# Patient Record
Sex: Female | Born: 1935 | Race: Black or African American | Hispanic: No | Smoking: Former smoker
Health system: Southern US, Community
[De-identification: ages and names within clinical notes are randomized; demographics above are authoritative.]

## PROBLEM LIST (undated history)

## (undated) DIAGNOSIS — I499 Cardiac arrhythmia, unspecified: Secondary | ICD-10-CM

## (undated) DIAGNOSIS — I10 Essential (primary) hypertension: Secondary | ICD-10-CM

## (undated) DIAGNOSIS — I639 Cerebral infarction, unspecified: Secondary | ICD-10-CM

## (undated) DIAGNOSIS — T827XXA Infection and inflammatory reaction due to other cardiac and vascular devices, implants and grafts, initial encounter: Secondary | ICD-10-CM

## (undated) DIAGNOSIS — N289 Disorder of kidney and ureter, unspecified: Secondary | ICD-10-CM

## (undated) DIAGNOSIS — I252 Old myocardial infarction: Secondary | ICD-10-CM

## (undated) DIAGNOSIS — D649 Anemia, unspecified: Secondary | ICD-10-CM

## (undated) DIAGNOSIS — C801 Malignant (primary) neoplasm, unspecified: Secondary | ICD-10-CM

## (undated) HISTORY — DX: Anemia, unspecified: D64.9

## (undated) HISTORY — PX: INSERT / REPLACE / REMOVE PACEMAKER: SUR710

## (undated) HISTORY — PX: APPENDECTOMY: SHX54

## (undated) HISTORY — DX: Cerebral infarction, unspecified: I63.9

## (undated) HISTORY — PX: HIP FRACTURE SURGERY: SHX118

## (undated) HISTORY — PX: OTHER SURGICAL HISTORY: SHX169

## (undated) HISTORY — PX: CHOLECYSTECTOMY: SHX55

## (undated) HISTORY — PX: AV FISTULA PLACEMENT: SHX1204

---

## 2006-05-17 ENCOUNTER — Ambulatory Visit (HOSPITAL_COMMUNITY): Admission: RE | Admit: 2006-05-17 | Discharge: 2006-05-17 | Payer: Self-pay | Admitting: *Deleted

## 2006-05-19 ENCOUNTER — Ambulatory Visit: Payer: Self-pay | Admitting: *Deleted

## 2006-07-19 ENCOUNTER — Ambulatory Visit: Payer: Self-pay | Admitting: Family Medicine

## 2006-08-22 ENCOUNTER — Ambulatory Visit: Payer: Self-pay | Admitting: Nephrology

## 2006-10-03 ENCOUNTER — Other Ambulatory Visit: Payer: Self-pay

## 2006-10-03 ENCOUNTER — Inpatient Hospital Stay: Payer: Self-pay | Admitting: *Deleted

## 2006-10-12 ENCOUNTER — Ambulatory Visit: Payer: Self-pay | Admitting: Internal Medicine

## 2006-11-06 ENCOUNTER — Ambulatory Visit: Payer: Self-pay | Admitting: Urology

## 2006-11-22 ENCOUNTER — Other Ambulatory Visit: Payer: Self-pay

## 2006-11-22 ENCOUNTER — Ambulatory Visit: Payer: Self-pay | Admitting: Urology

## 2008-02-10 ENCOUNTER — Other Ambulatory Visit: Payer: Self-pay

## 2008-02-10 ENCOUNTER — Inpatient Hospital Stay: Payer: Self-pay | Admitting: Internal Medicine

## 2008-02-14 ENCOUNTER — Other Ambulatory Visit: Payer: Self-pay

## 2008-05-14 ENCOUNTER — Inpatient Hospital Stay: Payer: Self-pay | Admitting: Internal Medicine

## 2008-05-30 ENCOUNTER — Inpatient Hospital Stay: Payer: Self-pay | Admitting: Internal Medicine

## 2008-06-05 ENCOUNTER — Ambulatory Visit: Payer: Self-pay | Admitting: Nephrology

## 2008-06-10 ENCOUNTER — Ambulatory Visit: Payer: Self-pay | Admitting: Nephrology

## 2009-01-21 ENCOUNTER — Observation Stay: Payer: Self-pay | Admitting: Nephrology

## 2009-04-01 ENCOUNTER — Observation Stay: Payer: Self-pay | Admitting: Nephrology

## 2009-04-29 ENCOUNTER — Observation Stay: Payer: Self-pay | Admitting: Nephrology

## 2009-05-22 ENCOUNTER — Ambulatory Visit: Payer: Self-pay | Admitting: Vascular Surgery

## 2009-05-27 ENCOUNTER — Observation Stay: Payer: Self-pay | Admitting: Nephrology

## 2009-05-29 ENCOUNTER — Ambulatory Visit: Payer: Self-pay | Admitting: Vascular Surgery

## 2009-06-09 ENCOUNTER — Inpatient Hospital Stay: Payer: Self-pay | Admitting: Internal Medicine

## 2009-06-29 ENCOUNTER — Observation Stay: Payer: Self-pay | Admitting: Nephrology

## 2009-07-06 ENCOUNTER — Emergency Department: Payer: Self-pay

## 2009-07-24 ENCOUNTER — Observation Stay: Payer: Self-pay | Admitting: Nephrology

## 2009-08-26 ENCOUNTER — Observation Stay: Payer: Self-pay | Admitting: Nephrology

## 2009-08-28 ENCOUNTER — Ambulatory Visit: Payer: Self-pay | Admitting: Nephrology

## 2009-09-10 ENCOUNTER — Ambulatory Visit: Payer: Self-pay | Admitting: Internal Medicine

## 2009-09-23 ENCOUNTER — Observation Stay: Payer: Self-pay | Admitting: Nephrology

## 2009-10-21 ENCOUNTER — Observation Stay: Payer: Self-pay | Admitting: Nephrology

## 2009-11-20 ENCOUNTER — Observation Stay: Payer: Self-pay | Admitting: Nephrology

## 2009-12-14 ENCOUNTER — Observation Stay: Payer: Self-pay | Admitting: Nephrology

## 2010-01-11 ENCOUNTER — Observation Stay: Payer: Self-pay | Admitting: Nephrology

## 2010-02-05 ENCOUNTER — Observation Stay: Payer: Self-pay | Admitting: Nephrology

## 2010-02-26 ENCOUNTER — Observation Stay: Payer: Self-pay | Admitting: Nephrology

## 2010-03-26 ENCOUNTER — Observation Stay: Payer: Self-pay | Admitting: Nephrology

## 2010-04-27 ENCOUNTER — Emergency Department: Payer: Self-pay | Admitting: Emergency Medicine

## 2010-04-28 ENCOUNTER — Observation Stay: Payer: Self-pay | Admitting: Nephrology

## 2010-05-12 ENCOUNTER — Observation Stay: Payer: Self-pay | Admitting: Nephrology

## 2010-06-16 ENCOUNTER — Observation Stay: Payer: Self-pay | Admitting: Nephrology

## 2010-07-17 ENCOUNTER — Observation Stay: Payer: Self-pay | Admitting: Nephrology

## 2010-08-02 ENCOUNTER — Observation Stay: Payer: Self-pay | Admitting: Nephrology

## 2010-08-28 ENCOUNTER — Observation Stay: Payer: Self-pay | Admitting: Nephrology

## 2010-09-09 ENCOUNTER — Emergency Department: Payer: Self-pay | Admitting: Unknown Physician Specialty

## 2010-09-27 ENCOUNTER — Observation Stay: Payer: Self-pay | Admitting: Nephrology

## 2010-10-28 ENCOUNTER — Ambulatory Visit: Payer: Self-pay | Admitting: Internal Medicine

## 2010-11-03 ENCOUNTER — Observation Stay: Payer: Self-pay | Admitting: Nephrology

## 2010-12-09 ENCOUNTER — Observation Stay: Payer: Self-pay | Admitting: Internal Medicine

## 2011-01-12 ENCOUNTER — Observation Stay: Payer: Self-pay | Admitting: Nephrology

## 2011-02-11 ENCOUNTER — Observation Stay: Payer: Self-pay | Admitting: Nephrology

## 2011-02-27 ENCOUNTER — Other Ambulatory Visit: Payer: Self-pay | Admitting: Internal Medicine

## 2011-03-14 ENCOUNTER — Observation Stay: Payer: Self-pay | Admitting: Nephrology

## 2011-05-22 ENCOUNTER — Other Ambulatory Visit: Payer: Self-pay | Admitting: Internal Medicine

## 2011-05-29 ENCOUNTER — Other Ambulatory Visit: Payer: Self-pay | Admitting: Internal Medicine

## 2011-05-29 NOTE — Telephone Encounter (Signed)
This pt needs to set up appointment in office prior to additional refills.

## 2011-06-26 ENCOUNTER — Other Ambulatory Visit: Payer: Self-pay | Admitting: Internal Medicine

## 2011-07-13 ENCOUNTER — Other Ambulatory Visit: Payer: Self-pay | Admitting: Internal Medicine

## 2011-07-30 ENCOUNTER — Other Ambulatory Visit: Payer: Self-pay | Admitting: Internal Medicine

## 2011-07-31 NOTE — Telephone Encounter (Signed)
Needs to be seen

## 2011-12-04 ENCOUNTER — Other Ambulatory Visit: Payer: Self-pay | Admitting: Internal Medicine

## 2012-04-07 ENCOUNTER — Inpatient Hospital Stay: Payer: Self-pay | Admitting: Internal Medicine

## 2012-04-07 LAB — COMPREHENSIVE METABOLIC PANEL
Albumin: 2.8 g/dL — ABNORMAL LOW (ref 3.4–5.0)
Alkaline Phosphatase: 110 U/L (ref 50–136)
Anion Gap: 14 (ref 7–16)
Calcium, Total: 8.9 mg/dL (ref 8.5–10.1)
Chloride: 97 mmol/L — ABNORMAL LOW (ref 98–107)
Co2: 22 mmol/L (ref 21–32)
Creatinine: 7.81 mg/dL — ABNORMAL HIGH (ref 0.60–1.30)
EGFR (African American): 5 — ABNORMAL LOW
Glucose: 60 mg/dL — ABNORMAL LOW (ref 65–99)
SGOT(AST): 17 U/L (ref 15–37)
SGPT (ALT): 14 U/L (ref 12–78)

## 2012-04-07 LAB — CBC
HCT: 23.4 % — ABNORMAL LOW (ref 35.0–47.0)
HGB: 7.8 g/dL — ABNORMAL LOW (ref 12.0–16.0)
MCV: 97 fL (ref 80–100)
WBC: 18.9 10*3/uL — ABNORMAL HIGH (ref 3.6–11.0)

## 2012-04-07 LAB — PHOSPHORUS: Phosphorus: 6.3 mg/dL — ABNORMAL HIGH (ref 2.5–4.9)

## 2012-04-07 LAB — LIPASE, BLOOD: Lipase: 122 U/L (ref 73–393)

## 2012-04-07 LAB — CK TOTAL AND CKMB (NOT AT ARMC)
CK, Total: 23 U/L (ref 21–215)
CK-MB: 0.5 ng/mL (ref 0.5–3.6)

## 2012-04-08 LAB — URINALYSIS, COMPLETE
Blood: NEGATIVE
Ketone: NEGATIVE
Leukocyte Esterase: NEGATIVE
Nitrite: NEGATIVE
Ph: 9 (ref 4.5–8.0)
Protein: >=500
RBC,UR: 1 /HPF (ref 0–5)
WBC UR: <1 /HPF (ref 0–5)

## 2012-04-08 LAB — COMPREHENSIVE METABOLIC PANEL
Albumin: 2.8 g/dL — ABNORMAL LOW (ref 3.4–5.0)
Anion Gap: 7 (ref 7–16)
BUN: 26 mg/dL — ABNORMAL HIGH (ref 7–18)
Bilirubin,Total: 0.6 mg/dL (ref 0.2–1.0)
Calcium, Total: 9.2 mg/dL (ref 8.5–10.1)
Chloride: 99 mmol/L (ref 98–107)
Creatinine: 4.52 mg/dL — ABNORMAL HIGH (ref 0.60–1.30)
EGFR (African American): 10 — ABNORMAL LOW
EGFR (Non-African Amer.): 9 — ABNORMAL LOW
Glucose: 100 mg/dL — ABNORMAL HIGH (ref 65–99)
Osmolality: 277 (ref 275–301)
Potassium: 4.1 mmol/L (ref 3.5–5.1)
SGPT (ALT): 13 U/L (ref 12–78)
Total Protein: 7 g/dL (ref 6.4–8.2)

## 2012-04-08 LAB — CBC WITH DIFFERENTIAL/PLATELET
Basophil #: 0 10*3/uL (ref 0.0–0.1)
Basophil %: 0.2 %
Eosinophil #: 0.3 10*3/uL (ref 0.0–0.7)
HCT: 23.7 % — ABNORMAL LOW (ref 35.0–47.0)
HGB: 8 g/dL — ABNORMAL LOW (ref 12.0–16.0)
Lymphocyte %: 5.2 %
MCHC: 33.5 g/dL (ref 32.0–36.0)
MCV: 98 fL (ref 80–100)
Monocyte %: 11.8 %
Neutrophil #: 12 10*3/uL — ABNORMAL HIGH (ref 1.4–6.5)
Neutrophil %: 81.1 %
RDW: 17.9 % — ABNORMAL HIGH (ref 11.5–14.5)
WBC: 14.7 10*3/uL — ABNORMAL HIGH (ref 3.6–11.0)

## 2012-04-09 LAB — BASIC METABOLIC PANEL
Anion Gap: 9 (ref 7–16)
BUN: 40 mg/dL — ABNORMAL HIGH (ref 7–18)
Calcium, Total: 9.4 mg/dL (ref 8.5–10.1)
Chloride: 95 mmol/L — ABNORMAL LOW (ref 98–107)
Co2: 29 mmol/L (ref 21–32)
EGFR (African American): 7 — ABNORMAL LOW
Glucose: 100 mg/dL — ABNORMAL HIGH (ref 65–99)
Osmolality: 276 (ref 275–301)

## 2012-04-09 LAB — CBC WITH DIFFERENTIAL/PLATELET
Basophil #: 0 10*3/uL (ref 0.0–0.1)
Basophil %: 0.5 %
Eosinophil %: 4.1 %
HCT: 25.1 % — ABNORMAL LOW (ref 35.0–47.0)
HGB: 8.3 g/dL — ABNORMAL LOW (ref 12.0–16.0)
Lymphocyte #: 1 10*3/uL (ref 1.0–3.6)
MCH: 32.9 pg (ref 26.0–34.0)
MCV: 99 fL (ref 80–100)
Monocyte %: 14.8 %
Neutrophil #: 6.7 10*3/uL — ABNORMAL HIGH (ref 1.4–6.5)
Neutrophil %: 70.2 %
Platelet: 269 10*3/uL (ref 150–440)
RBC: 2.53 10*6/uL — ABNORMAL LOW (ref 3.80–5.20)
WBC: 9.5 10*3/uL (ref 3.6–11.0)

## 2012-04-10 LAB — CBC WITH DIFFERENTIAL/PLATELET
Basophil %: 0.9 %
Eosinophil #: 0.3 10*3/uL (ref 0.0–0.7)
Eosinophil %: 4.6 %
HCT: 23.2 % — ABNORMAL LOW (ref 35.0–47.0)
HGB: 7.4 g/dL — ABNORMAL LOW (ref 12.0–16.0)
Lymphocyte #: 1.1 10*3/uL (ref 1.0–3.6)
MCH: 31.9 pg (ref 26.0–34.0)
MCHC: 32.1 g/dL (ref 32.0–36.0)
MCV: 99 fL (ref 80–100)
Monocyte #: 1.3 x10 3/mm — ABNORMAL HIGH (ref 0.2–0.9)
Neutrophil #: 4.6 10*3/uL (ref 1.4–6.5)
Neutrophil %: 62.3 %
Platelet: 245 10*3/uL (ref 150–440)
RBC: 2.34 10*6/uL — ABNORMAL LOW (ref 3.80–5.20)

## 2012-04-10 LAB — BASIC METABOLIC PANEL
Anion Gap: 10 (ref 7–16)
BUN: 52 mg/dL — ABNORMAL HIGH (ref 7–18)
Creatinine: 7.46 mg/dL — ABNORMAL HIGH (ref 0.60–1.30)
EGFR (African American): 6 — ABNORMAL LOW
EGFR (Non-African Amer.): 5 — ABNORMAL LOW
Glucose: 95 mg/dL (ref 65–99)
Osmolality: 280 (ref 275–301)

## 2012-04-11 LAB — CBC WITH DIFFERENTIAL/PLATELET
Eosinophil %: 3.8 %
HCT: 24.4 % — ABNORMAL LOW (ref 35.0–47.0)
HGB: 8.1 g/dL — ABNORMAL LOW (ref 12.0–16.0)
Lymphocyte #: 1.1 10*3/uL (ref 1.0–3.6)
Lymphocyte %: 14.6 %
MCHC: 33.4 g/dL (ref 32.0–36.0)
MCV: 98 fL (ref 80–100)
Monocyte %: 22.4 %
Neutrophil #: 4.2 10*3/uL (ref 1.4–6.5)
Neutrophil %: 57.7 %
RBC: 2.49 10*6/uL — ABNORMAL LOW (ref 3.80–5.20)

## 2012-04-13 LAB — CULTURE, BLOOD (SINGLE)

## 2012-05-21 ENCOUNTER — Emergency Department: Payer: Self-pay | Admitting: Emergency Medicine

## 2012-05-21 LAB — COMPREHENSIVE METABOLIC PANEL
Albumin: 3 g/dL — ABNORMAL LOW (ref 3.4–5.0)
Alkaline Phosphatase: 136 U/L (ref 50–136)
BUN: 33 mg/dL — ABNORMAL HIGH (ref 7–18)
Bilirubin,Total: 0.5 mg/dL (ref 0.2–1.0)
Chloride: 101 mmol/L (ref 98–107)
Co2: 22 mmol/L (ref 21–32)
EGFR (Non-African Amer.): 5 — ABNORMAL LOW
Osmolality: 274 (ref 275–301)
Potassium: 3.9 mmol/L (ref 3.5–5.1)
SGOT(AST): 14 U/L — ABNORMAL LOW (ref 15–37)
SGPT (ALT): 9 U/L — ABNORMAL LOW (ref 12–78)

## 2012-05-21 LAB — CBC
HCT: 22.4 % — ABNORMAL LOW (ref 35.0–47.0)
HGB: 6.9 g/dL — ABNORMAL LOW (ref 12.0–16.0)
MCV: 98 fL (ref 80–100)
RBC: 2.3 10*6/uL — ABNORMAL LOW (ref 3.80–5.20)
RDW: 19.8 % — ABNORMAL HIGH (ref 11.5–14.5)
WBC: 5.1 10*3/uL (ref 3.6–11.0)

## 2012-05-21 LAB — URINALYSIS, COMPLETE
Bilirubin,UR: NEGATIVE
Blood: NEGATIVE
Glucose,UR: NEGATIVE mg/dL (ref 0–75)
Ketone: NEGATIVE
Leukocyte Esterase: NEGATIVE
Ph: 8 (ref 4.5–8.0)
Specific Gravity: 1.006 (ref 1.003–1.030)
Squamous Epithelial: 7

## 2012-05-21 LAB — LIPASE, BLOOD: Lipase: 148 U/L (ref 73–393)

## 2012-07-06 LAB — CBC
HGB: 9.5 g/dL — ABNORMAL LOW (ref 12.0–16.0)
Platelet: 218 10*3/uL (ref 150–440)
RBC: 2.84 10*6/uL — ABNORMAL LOW (ref 3.80–5.20)
WBC: 11.2 10*3/uL — ABNORMAL HIGH (ref 3.6–11.0)

## 2012-07-06 LAB — BASIC METABOLIC PANEL
Anion Gap: 11 (ref 7–16)
Chloride: 100 mmol/L (ref 98–107)
Co2: 23 mmol/L (ref 21–32)
Creatinine: 5.11 mg/dL — ABNORMAL HIGH (ref 0.60–1.30)
EGFR (African American): 9 — ABNORMAL LOW
EGFR (Non-African Amer.): 8 — ABNORMAL LOW
Osmolality: 271 (ref 275–301)
Potassium: 3.8 mmol/L (ref 3.5–5.1)
Sodium: 134 mmol/L — ABNORMAL LOW (ref 136–145)

## 2012-07-06 LAB — CK TOTAL AND CKMB (NOT AT ARMC)
CK, Total: 35 U/L (ref 21–215)
CK-MB: 0.6 ng/mL (ref 0.5–3.6)

## 2012-07-07 ENCOUNTER — Observation Stay: Payer: Self-pay | Admitting: Internal Medicine

## 2012-07-07 LAB — TROPONIN I
Troponin-I: 0.1 ng/mL — ABNORMAL HIGH
Troponin-I: 0.1 ng/mL — ABNORMAL HIGH

## 2012-07-07 LAB — PHOSPHORUS: Phosphorus: 3.8 mg/dL (ref 2.5–4.9)

## 2012-07-08 LAB — CBC WITH DIFFERENTIAL/PLATELET
Basophil #: 0.1 10*3/uL (ref 0.0–0.1)
HCT: 27 % — ABNORMAL LOW (ref 35.0–47.0)
HGB: 8.7 g/dL — ABNORMAL LOW (ref 12.0–16.0)
Monocyte #: 1.4 x10 3/mm — ABNORMAL HIGH (ref 0.2–0.9)
Neutrophil #: 4.9 10*3/uL (ref 1.4–6.5)
Neutrophil %: 63.3 %
Platelet: 183 10*3/uL (ref 150–440)
RBC: 2.71 10*6/uL — ABNORMAL LOW (ref 3.80–5.20)
WBC: 7.7 10*3/uL (ref 3.6–11.0)

## 2012-07-08 LAB — BASIC METABOLIC PANEL
Anion Gap: 6 — ABNORMAL LOW (ref 7–16)
Chloride: 98 mmol/L (ref 98–107)
Creatinine: 3.89 mg/dL — ABNORMAL HIGH (ref 0.60–1.30)
EGFR (Non-African Amer.): 10 — ABNORMAL LOW
Glucose: 80 mg/dL (ref 65–99)
Potassium: 3.6 mmol/L (ref 3.5–5.1)
Sodium: 135 mmol/L — ABNORMAL LOW (ref 136–145)

## 2012-07-09 LAB — CBC WITH DIFFERENTIAL/PLATELET
Eosinophil %: 5.1 %
HCT: 26.2 % — ABNORMAL LOW (ref 35.0–47.0)
Lymphocyte #: 1.2 10*3/uL (ref 1.0–3.6)
Lymphocyte %: 18.4 %
MCH: 33.7 pg (ref 26.0–34.0)
MCHC: 34.1 g/dL (ref 32.0–36.0)
Monocyte #: 1.1 x10 3/mm — ABNORMAL HIGH (ref 0.2–0.9)
Monocyte %: 16.9 %
Neutrophil %: 58.3 %
Platelet: 216 10*3/uL (ref 150–440)
RBC: 2.65 10*6/uL — ABNORMAL LOW (ref 3.80–5.20)
RDW: 18.5 % — ABNORMAL HIGH (ref 11.5–14.5)

## 2012-07-09 LAB — BASIC METABOLIC PANEL
Calcium, Total: 9.3 mg/dL (ref 8.5–10.1)
Chloride: 93 mmol/L — ABNORMAL LOW (ref 98–107)
Co2: 27 mmol/L (ref 21–32)
EGFR (African American): 8 — ABNORMAL LOW
Glucose: 110 mg/dL — ABNORMAL HIGH (ref 65–99)
Osmolality: 267 (ref 275–301)
Potassium: 3.8 mmol/L (ref 3.5–5.1)
Sodium: 130 mmol/L — ABNORMAL LOW (ref 136–145)

## 2012-07-17 ENCOUNTER — Other Ambulatory Visit: Payer: Self-pay | Admitting: Internal Medicine

## 2012-07-21 ENCOUNTER — Ambulatory Visit: Payer: Self-pay | Admitting: Internal Medicine

## 2012-07-28 ENCOUNTER — Inpatient Hospital Stay: Payer: Self-pay | Admitting: Internal Medicine

## 2012-07-28 LAB — CBC
HCT: 27 % — ABNORMAL LOW (ref 35.0–47.0)
HGB: 8.7 g/dL — ABNORMAL LOW (ref 12.0–16.0)
MCH: 31.7 pg (ref 26.0–34.0)
MCV: 98 fL (ref 80–100)
Platelet: 341 10*3/uL (ref 150–440)
RDW: 19.1 % — ABNORMAL HIGH (ref 11.5–14.5)

## 2012-07-28 LAB — COMPREHENSIVE METABOLIC PANEL
Albumin: 2.7 g/dL — ABNORMAL LOW (ref 3.4–5.0)
Alkaline Phosphatase: 132 U/L (ref 50–136)
Anion Gap: 12 (ref 7–16)
BUN: 41 mg/dL — ABNORMAL HIGH (ref 7–18)
Bilirubin,Total: 0.6 mg/dL (ref 0.2–1.0)
Chloride: 100 mmol/L (ref 98–107)
Co2: 20 mmol/L — ABNORMAL LOW (ref 21–32)
Creatinine: 5.78 mg/dL — ABNORMAL HIGH (ref 0.60–1.30)
EGFR (African American): 7 — ABNORMAL LOW
EGFR (Non-African Amer.): 6 — ABNORMAL LOW
Glucose: 98 mg/dL (ref 65–99)
Potassium: 4.2 mmol/L (ref 3.5–5.1)
SGOT(AST): 13 U/L — ABNORMAL LOW (ref 15–37)
SGPT (ALT): 18 U/L (ref 12–78)
Sodium: 132 mmol/L — ABNORMAL LOW (ref 136–145)
Total Protein: 7.5 g/dL (ref 6.4–8.2)

## 2012-07-28 LAB — TROPONIN I: Troponin-I: 0.07 ng/mL — ABNORMAL HIGH

## 2012-07-29 LAB — CBC WITH DIFFERENTIAL/PLATELET
Basophil #: 0.1 10*3/uL (ref 0.0–0.1)
HCT: 23.9 % — ABNORMAL LOW (ref 35.0–47.0)
HGB: 7.9 g/dL — ABNORMAL LOW (ref 12.0–16.0)
Lymphocyte %: 7.7 %
MCH: 32 pg (ref 26.0–34.0)
MCV: 97 fL (ref 80–100)
Monocyte #: 1.5 x10 3/mm — ABNORMAL HIGH (ref 0.2–0.9)
Monocyte %: 15.6 %
Platelet: 325 10*3/uL (ref 150–440)
WBC: 9.9 10*3/uL (ref 3.6–11.0)

## 2012-07-29 LAB — COMPREHENSIVE METABOLIC PANEL
Alkaline Phosphatase: 118 U/L (ref 50–136)
Anion Gap: 8 (ref 7–16)
BUN: 20 mg/dL — ABNORMAL HIGH (ref 7–18)
Calcium, Total: 8.8 mg/dL (ref 8.5–10.1)
Chloride: 98 mmol/L (ref 98–107)
Co2: 29 mmol/L (ref 21–32)
Creatinine: 3.79 mg/dL — ABNORMAL HIGH (ref 0.60–1.30)
Glucose: 77 mg/dL (ref 65–99)
Osmolality: 272 (ref 275–301)
Sodium: 135 mmol/L — ABNORMAL LOW (ref 136–145)
Total Protein: 6.6 g/dL (ref 6.4–8.2)

## 2012-07-29 LAB — PHOSPHORUS: Phosphorus: 4.1 mg/dL (ref 2.5–4.9)

## 2012-07-30 LAB — CBC WITH DIFFERENTIAL/PLATELET
Eosinophil #: 0.1 10*3/uL (ref 0.0–0.7)
HCT: 26.8 % — ABNORMAL LOW (ref 35.0–47.0)
HGB: 8.5 g/dL — ABNORMAL LOW (ref 12.0–16.0)
Lymphocyte %: 7.6 %
MCH: 31.2 pg (ref 26.0–34.0)
MCHC: 31.9 g/dL — ABNORMAL LOW (ref 32.0–36.0)
MCV: 98 fL (ref 80–100)
Monocyte #: 1.8 x10 3/mm — ABNORMAL HIGH (ref 0.2–0.9)
Neutrophil #: 8.3 10*3/uL — ABNORMAL HIGH (ref 1.4–6.5)
Platelet: 397 10*3/uL (ref 150–440)
RDW: 18.6 % — ABNORMAL HIGH (ref 11.5–14.5)
WBC: 11.2 10*3/uL — ABNORMAL HIGH (ref 3.6–11.0)

## 2012-07-30 LAB — BASIC METABOLIC PANEL
BUN: 15 mg/dL (ref 7–18)
Co2: 30 mmol/L (ref 21–32)
Creatinine: 3.29 mg/dL — ABNORMAL HIGH (ref 0.60–1.30)
EGFR (African American): 15 — ABNORMAL LOW
Glucose: 98 mg/dL (ref 65–99)
Sodium: 133 mmol/L — ABNORMAL LOW (ref 136–145)

## 2012-07-31 LAB — BASIC METABOLIC PANEL
Anion Gap: 11 (ref 7–16)
BUN: 25 mg/dL — ABNORMAL HIGH (ref 7–18)
Chloride: 94 mmol/L — ABNORMAL LOW (ref 98–107)
Co2: 28 mmol/L (ref 21–32)
Creatinine: 4.49 mg/dL — ABNORMAL HIGH (ref 0.60–1.30)
Osmolality: 271 (ref 275–301)
Potassium: 3.7 mmol/L (ref 3.5–5.1)
Sodium: 133 mmol/L — ABNORMAL LOW (ref 136–145)

## 2012-07-31 LAB — CBC WITH DIFFERENTIAL/PLATELET
Basophil #: 0.2 10*3/uL — ABNORMAL HIGH (ref 0.0–0.1)
Basophil %: 1.3 %
Eosinophil #: 0.2 10*3/uL (ref 0.0–0.7)
HGB: 9 g/dL — ABNORMAL LOW (ref 12.0–16.0)
MCV: 99 fL (ref 80–100)
Monocyte #: 1.7 x10 3/mm — ABNORMAL HIGH (ref 0.2–0.9)
Monocyte %: 13.5 %
RBC: 2.84 10*6/uL — ABNORMAL LOW (ref 3.80–5.20)
RDW: 18.4 % — ABNORMAL HIGH (ref 11.5–14.5)

## 2012-08-03 LAB — BASIC METABOLIC PANEL
Anion Gap: 11 (ref 7–16)
BUN: 28 mg/dL — ABNORMAL HIGH (ref 7–18)
Creatinine: 3.94 mg/dL — ABNORMAL HIGH (ref 0.60–1.30)
Glucose: 104 mg/dL — ABNORMAL HIGH (ref 65–99)
Potassium: 3.5 mmol/L (ref 3.5–5.1)
Sodium: 135 mmol/L — ABNORMAL LOW (ref 136–145)

## 2012-08-03 LAB — CBC WITH DIFFERENTIAL/PLATELET
Basophil #: 0.1 10*3/uL (ref 0.0–0.1)
Basophil %: 0.6 %
Eosinophil %: 1 %
HCT: 30.2 % — ABNORMAL LOW (ref 35.0–47.0)
Lymphocyte %: 9.9 %
MCV: 99 fL (ref 80–100)
Monocyte #: 1 x10 3/mm — ABNORMAL HIGH (ref 0.2–0.9)
Neutrophil #: 8.4 10*3/uL — ABNORMAL HIGH (ref 1.4–6.5)
Neutrophil %: 78.7 %
Platelet: 384 10*3/uL (ref 150–440)
RBC: 3.05 10*6/uL — ABNORMAL LOW (ref 3.80–5.20)
RDW: 18.4 % — ABNORMAL HIGH (ref 11.5–14.5)
WBC: 10.6 10*3/uL (ref 3.6–11.0)

## 2012-08-03 LAB — CULTURE, BLOOD (SINGLE)

## 2012-08-05 ENCOUNTER — Inpatient Hospital Stay: Payer: Self-pay | Admitting: Internal Medicine

## 2012-08-05 LAB — CBC
HCT: 21.7 % — ABNORMAL LOW (ref 35.0–47.0)
HGB: 7 g/dL — ABNORMAL LOW (ref 12.0–16.0)
MCH: 31.5 pg (ref 26.0–34.0)
MCV: 98 fL (ref 80–100)
Platelet: 324 10*3/uL (ref 150–440)
WBC: 8.7 10*3/uL (ref 3.6–11.0)

## 2012-08-05 LAB — TROPONIN I
Troponin-I: 0.35 ng/mL — ABNORMAL HIGH
Troponin-I: 0.41 ng/mL — ABNORMAL HIGH

## 2012-08-05 LAB — COMPREHENSIVE METABOLIC PANEL
Anion Gap: 10 (ref 7–16)
BUN: 57 mg/dL — ABNORMAL HIGH (ref 7–18)
Bilirubin,Total: 0.5 mg/dL (ref 0.2–1.0)
Calcium, Total: 9.1 mg/dL (ref 8.5–10.1)
Chloride: 99 mmol/L (ref 98–107)
EGFR (African American): 7 — ABNORMAL LOW
EGFR (Non-African Amer.): 6 — ABNORMAL LOW
Glucose: 137 mg/dL — ABNORMAL HIGH (ref 65–99)
Potassium: 4.2 mmol/L (ref 3.5–5.1)
Total Protein: 6.5 g/dL (ref 6.4–8.2)

## 2012-08-05 LAB — CK TOTAL AND CKMB (NOT AT ARMC)
CK, Total: 40 U/L (ref 21–215)
CK, Total: 40 U/L (ref 21–215)
CK-MB: 0.6 ng/mL (ref 0.5–3.6)

## 2012-08-06 LAB — BASIC METABOLIC PANEL
BUN: 31 mg/dL — ABNORMAL HIGH (ref 7–18)
Co2: 33 mmol/L — ABNORMAL HIGH (ref 21–32)
EGFR (African American): 11 — ABNORMAL LOW
EGFR (Non-African Amer.): 9 — ABNORMAL LOW
Glucose: 98 mg/dL (ref 65–99)
Potassium: 3.8 mmol/L (ref 3.5–5.1)

## 2012-08-06 LAB — CBC WITH DIFFERENTIAL/PLATELET
Basophil %: 1.2 %
Eosinophil #: 0.2 10*3/uL (ref 0.0–0.7)
Lymphocyte #: 0.8 10*3/uL — ABNORMAL LOW (ref 1.0–3.6)
Lymphocyte %: 9.5 %
MCHC: 32.2 g/dL (ref 32.0–36.0)
MCV: 96 fL (ref 80–100)
Neutrophil #: 6.8 10*3/uL — ABNORMAL HIGH (ref 1.4–6.5)
Platelet: 356 10*3/uL (ref 150–440)
RBC: 3.17 10*6/uL — ABNORMAL LOW (ref 3.80–5.20)
RDW: 18.3 % — ABNORMAL HIGH (ref 11.5–14.5)
WBC: 8.6 10*3/uL (ref 3.6–11.0)

## 2012-08-06 LAB — LIPID PANEL
Cholesterol: 97 mg/dL (ref 0–200)
Ldl Cholesterol, Calc: 56 mg/dL (ref 0–100)
Triglycerides: 74 mg/dL (ref 0–200)
VLDL Cholesterol, Calc: 15 mg/dL (ref 5–40)

## 2012-08-06 LAB — CK TOTAL AND CKMB (NOT AT ARMC)
CK, Total: 36 U/L (ref 21–215)
CK-MB: 0.8 ng/mL (ref 0.5–3.6)

## 2012-08-06 LAB — TROPONIN I: Troponin-I: 0.58 ng/mL — ABNORMAL HIGH

## 2012-08-07 LAB — PHOSPHORUS: Phosphorus: 5.2 mg/dL — ABNORMAL HIGH (ref 2.5–4.9)

## 2012-08-07 LAB — HEMOGLOBIN: HGB: 9.2 g/dL — ABNORMAL LOW (ref 12.0–16.0)

## 2012-08-08 LAB — HEMOGLOBIN: HGB: 9.6 g/dL — ABNORMAL LOW (ref 12.0–16.0)

## 2012-08-09 LAB — HEMOGLOBIN: HGB: 9.6 g/dL — ABNORMAL LOW (ref 12.0–16.0)

## 2012-08-18 ENCOUNTER — Ambulatory Visit: Payer: Self-pay | Admitting: Internal Medicine

## 2012-08-21 ENCOUNTER — Inpatient Hospital Stay: Payer: Self-pay | Admitting: Internal Medicine

## 2012-08-21 LAB — COMPREHENSIVE METABOLIC PANEL
Albumin: 2.1 g/dL — ABNORMAL LOW (ref 3.4–5.0)
Anion Gap: 7 (ref 7–16)
Calcium, Total: 8.8 mg/dL (ref 8.5–10.1)
Chloride: 100 mmol/L (ref 98–107)
Co2: 25 mmol/L (ref 21–32)
EGFR (African American): 8 — ABNORMAL LOW
EGFR (Non-African Amer.): 7 — ABNORMAL LOW
Glucose: 95 mg/dL (ref 65–99)
Potassium: 4.1 mmol/L (ref 3.5–5.1)
SGPT (ALT): 18 U/L (ref 12–78)
Total Protein: 7 g/dL (ref 6.4–8.2)

## 2012-08-21 LAB — APTT: Activated PTT: 43.4 secs — ABNORMAL HIGH (ref 23.6–35.9)

## 2012-08-21 LAB — CBC
HCT: 25.3 % — ABNORMAL LOW (ref 35.0–47.0)
MCHC: 31.4 g/dL — ABNORMAL LOW (ref 32.0–36.0)
MCV: 98 fL (ref 80–100)
RBC: 2.59 10*6/uL — ABNORMAL LOW (ref 3.80–5.20)
RDW: 18.2 % — ABNORMAL HIGH (ref 11.5–14.5)

## 2012-08-21 LAB — PROTIME-INR
INR: 1.3
Prothrombin Time: 15.9 secs — ABNORMAL HIGH (ref 11.5–14.7)

## 2012-08-21 LAB — TROPONIN I: Troponin-I: 0.23 ng/mL — ABNORMAL HIGH

## 2012-08-21 LAB — CK TOTAL AND CKMB (NOT AT ARMC)
CK, Total: 44 U/L (ref 21–215)
CK-MB: 2.5 ng/mL (ref 0.5–3.6)

## 2012-08-21 LAB — PHOSPHORUS: Phosphorus: 3.9 mg/dL (ref 2.5–4.9)

## 2012-08-21 LAB — PRO B NATRIURETIC PEPTIDE: B-Type Natriuretic Peptide: 49834 pg/mL — ABNORMAL HIGH (ref 0–450)

## 2012-08-22 LAB — CBC WITH DIFFERENTIAL/PLATELET
Basophil %: 1.6 %
Eosinophil #: 0.1 10*3/uL (ref 0.0–0.7)
HCT: 23.2 % — ABNORMAL LOW (ref 35.0–47.0)
HGB: 7.3 g/dL — ABNORMAL LOW (ref 12.0–16.0)
Lymphocyte #: 0.6 10*3/uL — ABNORMAL LOW (ref 1.0–3.6)
MCH: 30.5 pg (ref 26.0–34.0)
MCV: 96 fL (ref 80–100)
Monocyte #: 0.7 x10 3/mm (ref 0.2–0.9)
Monocyte %: 11.4 %
Neutrophil #: 4.5 10*3/uL (ref 1.4–6.5)
Neutrophil %: 74.7 %
Platelet: 251 10*3/uL (ref 150–440)
RBC: 2.4 10*6/uL — ABNORMAL LOW (ref 3.80–5.20)

## 2012-08-22 LAB — BODY FLUID CELL COUNT WITH DIFFERENTIAL
Eosinophil: 0 %
Neutrophils: 26 %
Nucleated Cell Count: 736 /mm3
Other Cells BF: 0 %

## 2012-08-22 LAB — BASIC METABOLIC PANEL
Anion Gap: 4 — ABNORMAL LOW (ref 7–16)
BUN: 27 mg/dL — ABNORMAL HIGH (ref 7–18)
Creatinine: 3.55 mg/dL — ABNORMAL HIGH (ref 0.60–1.30)
EGFR (African American): 13 — ABNORMAL LOW
EGFR (Non-African Amer.): 12 — ABNORMAL LOW
Osmolality: 275 (ref 275–301)
Potassium: 3.6 mmol/L (ref 3.5–5.1)
Sodium: 135 mmol/L — ABNORMAL LOW (ref 136–145)

## 2012-08-22 LAB — LACTATE DEHYDROGENASE: LDH: 153 U/L (ref 81–246)

## 2012-08-22 LAB — LACTATE DEHYDROGENASE, PLEURAL OR PERITONEAL FLUID: LDH, Body Fluid: 122 U/L

## 2012-08-22 LAB — PROTIME-INR: INR: 1.3

## 2012-08-22 LAB — CK TOTAL AND CKMB (NOT AT ARMC): CK-MB: 2.7 ng/mL (ref 0.5–3.6)

## 2012-08-22 LAB — GLUCOSE, SEROUS FLUID: Glucose, Body Fluid: 75 mg/dL

## 2012-08-22 LAB — APTT: Activated PTT: 42.8 secs — ABNORMAL HIGH (ref 23.6–35.9)

## 2012-08-23 LAB — CBC WITH DIFFERENTIAL/PLATELET
Basophil %: 1.3 %
Eosinophil %: 2.3 %
HGB: 7.9 g/dL — ABNORMAL LOW (ref 12.0–16.0)
Lymphocyte %: 8.3 %
MCHC: 35.6 g/dL (ref 32.0–36.0)
Monocyte %: 9 %
RBC: 2.3 10*6/uL — ABNORMAL LOW (ref 3.80–5.20)
RDW: 18.1 % — ABNORMAL HIGH (ref 11.5–14.5)
WBC: 8.1 10*3/uL (ref 3.6–11.0)

## 2012-08-23 LAB — BASIC METABOLIC PANEL
BUN: 40 mg/dL — ABNORMAL HIGH (ref 7–18)
Calcium, Total: 8.5 mg/dL (ref 8.5–10.1)
Creatinine: 5.03 mg/dL — ABNORMAL HIGH (ref 0.60–1.30)
EGFR (African American): 9 — ABNORMAL LOW
EGFR (Non-African Amer.): 8 — ABNORMAL LOW
Osmolality: 279 (ref 275–301)
Sodium: 135 mmol/L — ABNORMAL LOW (ref 136–145)

## 2012-08-24 LAB — CBC WITH DIFFERENTIAL/PLATELET
Basophil #: 0.1 10*3/uL (ref 0.0–0.1)
Basophil %: 0.6 %
Eosinophil #: 0.1 10*3/uL (ref 0.0–0.7)
HCT: 22 % — ABNORMAL LOW (ref 35.0–47.0)
Lymphocyte %: 8 %
MCH: 30.2 pg (ref 26.0–34.0)
MCV: 97 fL (ref 80–100)
Monocyte #: 0.7 x10 3/mm (ref 0.2–0.9)
Monocyte %: 8.5 %
Neutrophil #: 7.2 10*3/uL — ABNORMAL HIGH (ref 1.4–6.5)
Neutrophil %: 81.2 %
Platelet: 207 10*3/uL (ref 150–440)
RBC: 2.28 10*6/uL — ABNORMAL LOW (ref 3.80–5.20)
RDW: 18.6 % — ABNORMAL HIGH (ref 11.5–14.5)

## 2012-08-24 LAB — BASIC METABOLIC PANEL
Creatinine: 3.58 mg/dL — ABNORMAL HIGH (ref 0.60–1.30)
EGFR (African American): 13 — ABNORMAL LOW
Potassium: 3.6 mmol/L (ref 3.5–5.1)
Sodium: 136 mmol/L (ref 136–145)

## 2012-08-25 LAB — CBC WITH DIFFERENTIAL/PLATELET
Basophil %: 0.9 %
Eosinophil #: 0.2 10*3/uL (ref 0.0–0.7)
HCT: 21.6 % — ABNORMAL LOW (ref 35.0–47.0)
HGB: 6.9 g/dL — ABNORMAL LOW (ref 12.0–16.0)
Lymphocyte #: 1 10*3/uL (ref 1.0–3.6)
Lymphocyte %: 5.7 %
MCHC: 31.8 g/dL — ABNORMAL LOW (ref 32.0–36.0)
Monocyte #: 1.2 x10 3/mm — ABNORMAL HIGH (ref 0.2–0.9)
Neutrophil #: 14.1 10*3/uL — ABNORMAL HIGH (ref 1.4–6.5)
Neutrophil %: 84.8 %
Platelet: 187 10*3/uL (ref 150–440)
RBC: 2.26 10*6/uL — ABNORMAL LOW (ref 3.80–5.20)
WBC: 16.7 10*3/uL — ABNORMAL HIGH (ref 3.6–11.0)

## 2012-08-26 LAB — CBC WITH DIFFERENTIAL/PLATELET
Basophil #: 0.1 10*3/uL (ref 0.0–0.1)
Eosinophil %: 2.4 %
HGB: 6.8 g/dL — ABNORMAL LOW (ref 12.0–16.0)
Lymphocyte #: 0.7 10*3/uL — ABNORMAL LOW (ref 1.0–3.6)
MCH: 30.9 pg (ref 26.0–34.0)
MCHC: 31.9 g/dL — ABNORMAL LOW (ref 32.0–36.0)
Neutrophil #: 7.5 10*3/uL — ABNORMAL HIGH (ref 1.4–6.5)
Neutrophil %: 79.3 %
Platelet: 166 10*3/uL (ref 150–440)
RBC: 2.22 10*6/uL — ABNORMAL LOW (ref 3.80–5.20)
RDW: 18.1 % — ABNORMAL HIGH (ref 11.5–14.5)

## 2012-08-26 LAB — BASIC METABOLIC PANEL
Anion Gap: 6 — ABNORMAL LOW (ref 7–16)
BUN: 20 mg/dL — ABNORMAL HIGH (ref 7–18)
Chloride: 96 mmol/L — ABNORMAL LOW (ref 98–107)
Co2: 31 mmol/L (ref 21–32)
Glucose: 64 mg/dL — ABNORMAL LOW (ref 65–99)
Osmolality: 267 (ref 275–301)

## 2012-08-27 LAB — CULTURE, BLOOD (SINGLE)

## 2012-08-28 LAB — BASIC METABOLIC PANEL
Anion Gap: 4 — ABNORMAL LOW (ref 7–16)
Chloride: 97 mmol/L — ABNORMAL LOW (ref 98–107)
EGFR (African American): 8 — ABNORMAL LOW
EGFR (Non-African Amer.): 7 — ABNORMAL LOW
Glucose: 85 mg/dL (ref 65–99)
Osmolality: 277 (ref 275–301)
Potassium: 5.2 mmol/L — ABNORMAL HIGH (ref 3.5–5.1)
Sodium: 134 mmol/L — ABNORMAL LOW (ref 136–145)

## 2012-08-28 LAB — CBC WITH DIFFERENTIAL/PLATELET
Basophil #: 0.1 10*3/uL (ref 0.0–0.1)
Eosinophil #: 0.3 10*3/uL (ref 0.0–0.7)
Eosinophil %: 4.4 %
HCT: 21.1 % — ABNORMAL LOW (ref 35.0–47.0)
HGB: 6.8 g/dL — ABNORMAL LOW (ref 12.0–16.0)
Lymphocyte #: 0.8 10*3/uL — ABNORMAL LOW (ref 1.0–3.6)
MCH: 31 pg (ref 26.0–34.0)
Monocyte %: 11.2 %
Neutrophil #: 5.6 10*3/uL (ref 1.4–6.5)
Neutrophil %: 73.6 %
RDW: 18.7 % — ABNORMAL HIGH (ref 11.5–14.5)
WBC: 7.6 10*3/uL (ref 3.6–11.0)

## 2012-08-29 LAB — BASIC METABOLIC PANEL
Anion Gap: 4 — ABNORMAL LOW (ref 7–16)
Calcium, Total: 7.5 mg/dL — ABNORMAL LOW (ref 8.5–10.1)
Chloride: 97 mmol/L — ABNORMAL LOW (ref 98–107)
EGFR (African American): 11 — ABNORMAL LOW
EGFR (Non-African Amer.): 10 — ABNORMAL LOW
Glucose: 67 mg/dL (ref 65–99)
Osmolality: 271 (ref 275–301)
Potassium: 4.7 mmol/L (ref 3.5–5.1)
Sodium: 134 mmol/L — ABNORMAL LOW (ref 136–145)

## 2012-08-29 LAB — CBC WITH DIFFERENTIAL/PLATELET
Basophil %: 1.2 %
Eosinophil #: 0.2 10*3/uL (ref 0.0–0.7)
HCT: 23.9 % — ABNORMAL LOW (ref 35.0–47.0)
Lymphocyte #: 0.9 10*3/uL — ABNORMAL LOW (ref 1.0–3.6)
Lymphocyte %: 12.1 %
MCH: 30.6 pg (ref 26.0–34.0)
MCHC: 32.7 g/dL (ref 32.0–36.0)
Monocyte #: 1.2 x10 3/mm — ABNORMAL HIGH (ref 0.2–0.9)
Neutrophil #: 4.7 10*3/uL (ref 1.4–6.5)
Platelet: 174 10*3/uL (ref 150–440)
RBC: 2.56 10*6/uL — ABNORMAL LOW (ref 3.80–5.20)
RDW: 18.9 % — ABNORMAL HIGH (ref 11.5–14.5)
WBC: 7.2 10*3/uL (ref 3.6–11.0)

## 2012-08-30 LAB — RENAL FUNCTION PANEL
Albumin: 2 g/dL — ABNORMAL LOW (ref 3.4–5.0)
Anion Gap: 5 — ABNORMAL LOW (ref 7–16)
BUN: 36 mg/dL — ABNORMAL HIGH (ref 7–18)
Calcium, Total: 7.6 mg/dL — ABNORMAL LOW (ref 8.5–10.1)
Creatinine: 5.65 mg/dL — ABNORMAL HIGH (ref 0.60–1.30)
Glucose: 48 mg/dL — ABNORMAL LOW (ref 65–99)
Phosphorus: 4.2 mg/dL (ref 2.5–4.9)
Potassium: 5 mmol/L (ref 3.5–5.1)

## 2012-09-01 LAB — RENAL FUNCTION PANEL
BUN: 28 mg/dL — ABNORMAL HIGH (ref 7–18)
Calcium, Total: 7.5 mg/dL — ABNORMAL LOW (ref 8.5–10.1)
Chloride: 97 mmol/L — ABNORMAL LOW (ref 98–107)
Creatinine: 4.93 mg/dL — ABNORMAL HIGH (ref 0.60–1.30)
EGFR (African American): 9 — ABNORMAL LOW
EGFR (Non-African Amer.): 8 — ABNORMAL LOW
Glucose: 114 mg/dL — ABNORMAL HIGH (ref 65–99)
Phosphorus: 2.7 mg/dL (ref 2.5–4.9)
Potassium: 4.2 mmol/L (ref 3.5–5.1)

## 2012-09-02 LAB — TROPONIN I
Troponin-I: 0.2 ng/mL — ABNORMAL HIGH
Troponin-I: 0.21 ng/mL — ABNORMAL HIGH

## 2012-09-12 ENCOUNTER — Inpatient Hospital Stay: Payer: Self-pay | Admitting: Vascular Surgery

## 2012-09-12 LAB — CREATININE, SERUM
Creatinine: 4.26 mg/dL — ABNORMAL HIGH (ref 0.60–1.30)
EGFR (Non-African Amer.): 9 — ABNORMAL LOW

## 2012-09-12 LAB — BUN: BUN: 29 mg/dL — ABNORMAL HIGH (ref 7–18)

## 2012-09-13 LAB — PROTIME-INR
INR: 1.1
Prothrombin Time: 14.5 secs (ref 11.5–14.7)

## 2012-09-13 LAB — APTT: Activated PTT: 36.3 secs — ABNORMAL HIGH (ref 23.6–35.9)

## 2012-09-13 LAB — CBC WITH DIFFERENTIAL/PLATELET
Basophil %: 0.5 %
Eosinophil #: 0 10*3/uL (ref 0.0–0.7)
Eosinophil %: 0.1 %
Lymphocyte #: 0.7 10*3/uL — ABNORMAL LOW (ref 1.0–3.6)
Lymphocyte %: 14.5 %
MCH: 31.2 pg (ref 26.0–34.0)
Monocyte #: 0.5 x10 3/mm (ref 0.2–0.9)
Monocyte %: 9.4 %
Neutrophil #: 3.7 10*3/uL (ref 1.4–6.5)
Neutrophil %: 75.5 %
Platelet: 255 10*3/uL (ref 150–440)
RBC: 2.41 10*6/uL — ABNORMAL LOW (ref 3.80–5.20)
RDW: 22.7 % — ABNORMAL HIGH (ref 11.5–14.5)
WBC: 4.9 10*3/uL (ref 3.6–11.0)

## 2012-09-13 LAB — BASIC METABOLIC PANEL
Creatinine: 4.71 mg/dL — ABNORMAL HIGH (ref 0.60–1.30)
EGFR (African American): 10 — ABNORMAL LOW
EGFR (Non-African Amer.): 8 — ABNORMAL LOW
Potassium: 5.2 mmol/L — ABNORMAL HIGH (ref 3.5–5.1)

## 2012-12-01 ENCOUNTER — Emergency Department: Payer: Self-pay | Admitting: Internal Medicine

## 2012-12-01 LAB — BASIC METABOLIC PANEL
Anion Gap: 6 — ABNORMAL LOW (ref 7–16)
Calcium, Total: 8.5 mg/dL (ref 8.5–10.1)
Co2: 27 mmol/L (ref 21–32)
EGFR (African American): 16 — ABNORMAL LOW
EGFR (Non-African Amer.): 14 — ABNORMAL LOW
Glucose: 114 mg/dL — ABNORMAL HIGH (ref 65–99)
Osmolality: 274 (ref 275–301)
Sodium: 136 mmol/L (ref 136–145)

## 2012-12-01 LAB — CBC
HCT: 34.1 % — ABNORMAL LOW (ref 35.0–47.0)
HGB: 11.3 g/dL — ABNORMAL LOW (ref 12.0–16.0)
Platelet: 173 10*3/uL (ref 150–440)
RDW: 16.7 % — ABNORMAL HIGH (ref 11.5–14.5)
WBC: 6.4 10*3/uL (ref 3.6–11.0)

## 2012-12-01 LAB — PROTIME-INR: INR: 1.1

## 2013-07-29 ENCOUNTER — Emergency Department: Payer: Self-pay | Admitting: Emergency Medicine

## 2013-07-29 LAB — BASIC METABOLIC PANEL
Anion Gap: 10 (ref 7–16)
BUN: 43 mg/dL — AB (ref 7–18)
CALCIUM: 9.3 mg/dL (ref 8.5–10.1)
CREATININE: 8.95 mg/dL — AB (ref 0.60–1.30)
Chloride: 102 mmol/L (ref 98–107)
Co2: 24 mmol/L (ref 21–32)
GFR CALC AF AMER: 4 — AB
GFR CALC NON AF AMER: 4 — AB
GLUCOSE: 93 mg/dL (ref 65–99)
OSMOLALITY: 282 (ref 275–301)
POTASSIUM: 4.3 mmol/L (ref 3.5–5.1)
SODIUM: 136 mmol/L (ref 136–145)

## 2013-07-29 LAB — CBC WITH DIFFERENTIAL/PLATELET
BASOS ABS: 0 10*3/uL (ref 0.0–0.1)
BASOS PCT: 0.3 %
EOS PCT: 4 %
Eosinophil #: 0.3 10*3/uL (ref 0.0–0.7)
HCT: 34.8 % — ABNORMAL LOW (ref 35.0–47.0)
HGB: 11.2 g/dL — ABNORMAL LOW (ref 12.0–16.0)
LYMPHS PCT: 26.1 %
Lymphocyte #: 2 10*3/uL (ref 1.0–3.6)
MCH: 32.7 pg (ref 26.0–34.0)
MCHC: 32.2 g/dL (ref 32.0–36.0)
MCV: 102 fL — ABNORMAL HIGH (ref 80–100)
MONOS PCT: 12.8 %
Monocyte #: 1 x10 3/mm — ABNORMAL HIGH (ref 0.2–0.9)
NEUTROS ABS: 4.4 10*3/uL (ref 1.4–6.5)
Neutrophil %: 56.8 %
Platelet: 256 10*3/uL (ref 150–440)
RBC: 3.42 10*6/uL — ABNORMAL LOW (ref 3.80–5.20)
RDW: 17.6 % — AB (ref 11.5–14.5)
WBC: 7.8 10*3/uL (ref 3.6–11.0)

## 2013-07-29 LAB — URINALYSIS, COMPLETE
Bilirubin,UR: NEGATIVE
Glucose,UR: NEGATIVE mg/dL (ref 0–75)
Hyaline Cast: 4
KETONE: NEGATIVE
Nitrite: NEGATIVE
PH: 8 (ref 4.5–8.0)
RBC,UR: 46 /HPF (ref 0–5)
Specific Gravity: 1.008 (ref 1.003–1.030)

## 2013-07-29 LAB — TROPONIN I: Troponin-I: 0.08 ng/mL — ABNORMAL HIGH

## 2014-09-25 ENCOUNTER — Ambulatory Visit
Admit: 2014-09-25 | Disposition: A | Discharge status: Home / Self Care | Payer: Self-pay | Attending: Physician Assistant | Admitting: Physician Assistant

## 2014-10-02 ENCOUNTER — Ambulatory Visit
Admit: 2014-10-02 | Disposition: A | Discharge status: Home / Self Care | Payer: Self-pay | Attending: Physician Assistant | Admitting: Physician Assistant

## 2014-10-07 NOTE — H&P (Signed)
PATIENT NAME:  Patricia Woodard, Patricia Woodard MR#:  [id number] DATE OF BIRTH:  Apr 15, 1932  DATE OF ADMISSION:  04/07/2012  CHIEF COMPLAINT: Abdominal pain and weight loss.   HISTORY OF PRESENT ILLNESS: Patricia Woodard is a pleasant 81 year old female with end-stage renal disease on dialysis. She has had weight loss over several months she says. This was confirmed in the [facility] records where she gets her outpatient care per my review. She has had worsening abdominal pain for the past several weeks unresponsive to Cipro and Flagyl as an outpatient. She presents with worsening pain now, white count of 18,000, some nausea and poor appetite as well. She has had no diarrhea. Stools have, in fact, been normal.   REVIEW OF SYSTEMS: Negative other than what is noted above.   PAST MEDICAL HISTORY:  1. End-stage renal disease as noted.  2. Hypertension which has been difficult to control.  3. Hypercholesterolemia.  4. Coronary artery disease.  5. Dilated cardiomyopathy with ejection fraction of 15% noted by Dr. Nehemiah Massed.  6. Peripheral arterial disease.  7. Renal cell carcinoma, followed by [facility]. 8. Hypothyroid.   PAST SURGICAL HISTORY:  1. Appendectomy. 2. Cholecystectomy. 3. Ectopic pregnancy. 4. History of pacemaker in [place], [place]. 5. Right kidney biopsy in 2010. Right kidney radiation therapy following that. 6. Left arm AV shunt placement.   MEDICATIONS PER LAST NOTE IN [place] 03/30/2012:   1. Aspirin 81 mg daily.  2. Carvedilol 6.25 b.i.d.  3. Furosemide 80 mg daily.  4. Hydralazine 100 mg t.i.d.  5. lovastatin er 40 mg daily.  6. Pacerone 200 mg daily.  7. Levothyroxine 100 mcg daily.  8. Sevelamer carbonate 800 mg t.i.d. with meals  9. Isosorbide mononitrate 30 mg daily.  10. Losartan 100 mg daily.  11. Amlodipine 5 mg daily.  12. Edarbi 80 mg daily (question she is on that and the losartan is an ARB).  13. Clonidine 0.1 mg t.i.d.  14. Nexium 20 mg daily.   15. Remeron 15 mg at bedtime, was recently begun.   ALLERGIES: Penicillin, shellfish, IV contrast dye are noted. She, again, is on amiodarone without difficulty as noted above.   SOCIAL HISTORY: She is widowed and retired. Smokes minimal cigarettes. No alcohol.   FAMILY HISTORY: Parents deceased, cause of death not known. One living sister, as well multiple family members around also.   PHYSICAL EXAMINATION:   GENERAL: Elderly chronically ill appearing in no acute distress.  VITAL SIGNS: Her blood pressure in triage was elevated at 198/80, temperature 98.8, pulse oximetry 98%, respirations 16, pulse 67. Has not been reassessed as of yet on the system notes.   HEENT: Oropharynx clear. Conjunctivae normal.   NECK: No bruits, thyromegaly, adenopathy, or JVD.   CHEST: Clear to auscultation to inspection.   HEART: Regular rate and rhythm without murmurs or gallops.   ABDOMEN: Soft, mildly protuberant, diffusely tender. Normal bowel sounds.   EXTREMITIES: No clubbing, cyanosis, or edema.    NEUROLOGIC: Nonfocal.   SKIN: No lesions noted.   LABORATORY, DIAGNOSTIC, AND RADIOLOGICAL DATA: Lipase 122. Normal lactic acid. Troponin minimally elevated at 0.15. White count 18,900, hemoglobin 7.8, creatinine 7.81, sodium 133.   CT of the abdomen and pelvis was done without contrast given her allergies and renal disease showed right colon thickened and distal ascending and proximal transverse colon as well. No perforation or abscess was noted. Small pericardial effusion was seen as well.   ASSESSMENT AND PLAN:  1. Abdominal pain and weight loss,  somewhat worrisome overall. She has failed outpatient antibiotics and she has markedly elevated white blood cell count. Will put her in the hospital on IV antibiotics. Will add vancomycin to the Cipro and Flagyl. Follow levels closely in case this is a resistant organism causing her infection. Will need repeat CT scan versus GI consult and colonoscopy  pending her course with this. She is obviously at high risk for any procedure with her multiple medical problems, etc. Will order p.r.n. analgesics for pain as well.  2. End-stage renal disease. Will have Nephrology consult to manage her dialysis and other renal needs in the hospital.  3. Coronary artery disease, fairly stable now. Continue her medications. Will only put her on one ARB and will hold her statin while she is on multiple antibiotics.   ____________________________ Ocie Cornfield. Ouida Sills, MD mwa:drc D: 04/07/2012 11:35:46 ET T: 04/07/2012 12:04:36 ET JOB#: [id number]  cc: Ocie Cornfield. Ouida Sills, MD, <Dictator> Kirk Ruths MD ELECTRONICALLY SIGNED 04/10/2012 7:42

## 2014-10-07 NOTE — Discharge Summary (Signed)
PATIENT NAME:  Patricia Woodard, Patricia Woodard MR#:  [id number] DATE OF BIRTH:  11/22/1931  DATE OF ADMISSION:  04/07/2012 DATE OF DISCHARGE:  04/11/2012  HISTORY OF PRESENT ILLNESS: Patricia Woodard was a 81 year old lady with end-stage renal disease, on dialysis, who presented with worsening abdominal pain unresponsive to outpatient Cipro and Flagyl. She had had some nausea but no vomiting. She had had no diarrhea. White count however was 18,000 and the patient was therefore admitted.   PAST MEDICAL HISTORY:  1. End-stage kidney disease. 2. Hypertension.  3. Hyperlipidemia.  4. Coronary artery disease.  5. Dilated cardiomyopathy.  6. Peripheral arterial disease.  7. Hypothyroidism.  8. History of renal cell carcinoma.   PAST SURGICAL HISTORY:  1. Appendectomy. 2. Cholecystectomy. 3. Ectopic pregnancy.  4. Cardiac pacemaker placement.  5. Right kidney biopsy. 6. Left arm AV shunt placement.   ADMISSION MEDICATIONS: 1. Aspirin 81 mg daily.  2. Carvedilol 6.25 mg twice a day. 3. Furosemide 80 mg daily. 4. Hydralazine 100 mg three times daily. 5. Lovastatin 40 mg daily.  6. Amiodarone 200 mg daily.  7. Levoxyl 100 mcg daily. 8. Sevelamer carbonate 800 mg three times daily with meals. 9. Isosorbide 30 mg daily.  10. Losartan 100 mg daily.  11. Amlodipine 5 mg daily.  12. Clonidine 0.1 mg three times daily. 13. Nexium 20 mg daily.  14. Remeron 15 mg at bedtime.   ALLERGIES: Penicillin, shellfish, and IV contrast dye.   ADMISSION PHYSICAL EXAMINATION: Blood pressure was 198/80, temperature 98.8, pulse oximetry 98%, respirations 16, and pulse was 67. The examination was relatively unremarkable with the exception of the abdomen which was soft and mildly protuberant with diffuse tenderness but no rebound.   LABS/RADIOLOGIC STUDIES: Admission CBC showed a hemoglobin of 7.8 with a hematocrit of 23.4. White count was 18,900. Platelet count was 260,000. Admission comprehensive metabolic panel showed a random  blood sugar of 60, a BUN of 50, a creatinine of 7.8, a sodium of 133, a chloride of 97, an albumin of 2.8, and an estimated GFR of 5. Lipase was 122. Troponin was 0.15. Urinalysis showed greater than 500 protein. Lactic acid was 0.9.   Chest x-ray showed interstitial edema.   CT scan of the abdomen and pelvis was suggestive of right-sided diverticulitis.   HOSPITAL COURSE: The patient was admitted to the regular floor where she was placed on IV antibiotics. She was seen during her hospitalization by nephrology and continued to get dialysis while she was hospitalized. The patient was placed initially on IV Cipro, Flagyl, and vancomycin. The vancomycin was eventually discontinued. Blood cultures were obtained on admission and eventually showed no growth. The patient was continued on IV antibiotics until she was relatively asymptomatic. She was then converted to Cipro and Flagyl orally. Her final CBC showed a hemoglobin of 8.1 with a hematocrit of 24.4. White count was 7300.   DISCHARGE DIAGNOSES:  1. Acute diverticulitis.  2. End-stage renal disease, on dialysis.  3. Volume overload secondary to renal failure.  4. Hypertension.  5. Coronary artery disease.  6. Dilated cardiomyopathy.  7. Hypothyroidism.   DISCHARGE DISPOSITION: The patient was discharged on a renal failure diet with activity as tolerated.   DISCHARGE MEDICATIONS:  1. Amlodipine 5 mg daily.  2. Aspirin 81 mg daily. 3. Diphenhydramine 25 mg every six hours as needed.  4. Carvedilol 6.25 mg twice a day. 5. Cinacalcet 1 tablet daily.  6. Furosemide 80 mg daily.  7. Hydralazine 100 mg three times daily. 8. Isosorbide 30  mg daily.  9. Lovastatin 40 mg daily.  10. Megestrol 40 mg/mL 20 mL daily.  11. Amiodarone 200 mg daily.  12. Renvela 800 mg three times daily. 13. Edarbi 80 mg daily.  14. Rena-Vite 1 tablet daily.  15. Norco 5/325 mg one tablet three times daily as needed.  16. Levoxyl 150 mcg daily.  17. Phenergan 25  mg every six hours as needed.  18. Cipro 250 mg twice a day x7 days.  19. Flagyl 250 mg three times daily x7 days.   PLAN: The patient is being followed up in the office in 1 to 2 weeks.  ____________________________ Hewitt Blade Sarina Ser, MD jbw:[id number] D: 04/25/2012 08:02:37 ET T: 04/26/2012 09:03:26 ET JOB#: [id number]  cc: Hewitt Blade. Sarina Ser, MD, <Dictator> Lottie Mussel III MD ELECTRONICALLY SIGNED 04/27/2012 7:52

## 2014-10-07 NOTE — Consult Note (Signed)
General Aspect 81 year old female with history of ischemic cardiomyopathy, EF of 15%,defibrillator placement after V. fib arrest in the [place] area in October 09, end stage renal disease on dialysis, as well as chronic atrial fibrillation and status post treatment for renal cancer. Patient was admitted to the hospital recently with abdominal pain and weight loss. She is improving after triple antibiotic course, improving appetite, with diagnosis of diverticulitis. While receiving dialysis in the hospital, there was a wide complex noted on the monitor.. There was no firing of her defibrillator,  patient was unaware of arrhythmia.  She does remain on amiodarone 200 mg a day as well as Coreg 6.25 mg daily.  Her device was interrogated several months ago was found to be working normally without any significant arrhythmias.  Review of the strips with Dr. Nehemiah Massed reveals that the wide complex is   probably her paced rhythm.  There is a pacer spike visible.  Also, the rate is in the 80s.  It does not appear to be ventricular tachycardia.   Physical Exam:   GEN well developed, no acute distress, critically ill appearing    HEENT pink conjunctivae    RESP normal resp effort  crackles    CARD Regular rate and rhythm  No murmur    EXTR negative edema    SKIN skin turgor decreased    NEURO cranial nerves intact, motor/sensory function intact    PSYCH alert, A+O to time, place, person, poor insight   Review of Systems:   Subjective/Chief Complaint No complaints currently.    Review of Systems: All other systems were reviewed and found to be negative   Home Medications: Medication Instructions Status  amlodipine 5 mg oral tablet 1 tab(s) orally once a day  Active  aspirin 81 mg oral tablet 1 tab(s) orally once a day Active  diphenhydrAMINE 25 mg oral tablet 1 tab(s) orally every 6 hours as needed for itching.  Active  carvedilol 6.25 mg oral tablet 1 tab(s) orally 2 times a day. Active   cinacalcet 30 mg oral tablet 1 tab(s) orally once a day with dinner. Active  furosemide 80 mg oral tablet 1 tab(s) orally once a day Active  hydrALAZINE 100 mg oral tablet 1 tab(s) orally 3 times a day. Active  isosorbide dinitrate 30 mg oral tablet 1 tab(s) orally once a day Active  lovastatin 40 mg oral tablet 1 tab(s) orally once a day (at bedtime) Active  megestrol 40 mg/mL oral suspension 20 milliliter(s) orally once a day for appetite stimulation. Active  amiodarone 200 mg oral tablet 1 tab(s) orally once a day Active  Renvela 800 mg oral tablet 1 tab(s) orally 3 times a day (with meals). Active  Edarbi 80 mg oral tablet 1 tab(s) orally once a day Active  Rena-Vite oral tablet 1 tab(s) orally once a day only on dialysis days. Active  Norco 5 mg-325 mg oral tablet 1 tab(s) orally 3 times a day, As Needed- for Pain  Active  levothyroxine 150 mcg (0.15 mg) oral tablet 1 tab(s) orally once a day Active  promethazine 25 mg oral tablet 1 tab(s) orally every 6 hours, As Needed- for Nausea, Vomiting.  Active   Lab Results: Hepatic:  20-Oct-13 04:33    Bilirubin, Total 0.6   Alkaline Phosphatase 103   SGPT (ALT) 13   SGOT (AST)  12   Total Protein, Serum 7.0   Albumin, Serum  2.8  Routine Chem:  20-Oct-13 04:33    Glucose,  Serum  100   Creatinine (comp)  4.52   Sodium, Serum 136   Potassium, Serum 4.1   Chloride, Serum 99   CO2, Serum 30   Calcium (Total), Serum 9.2   Anion Gap 7   Osmolality (calc) 277   eGFR (African American)  10   eGFR (Non-African American)  9 (eGFR values <20m/min/1.73 m2 may be an indication of chronic kidney disease (CKD). Calculated eGFR is useful in patients with stable renal function. The eGFR calculation will not be reliable in acutely ill patients when serum creatinine is changing rapidly. It is not useful in  patients on dialysis. The eGFR calculation may not be applicable to patients at the low and high extremes of body sizes, pregnant women,  and vegetarians.)  Routine UA:  20-Oct-13 06:13    Color (UA) Yellow   Clarity (UA) Hazy   Glucose (UA) Negative   Bilirubin (UA) Negative   Ketones (UA) Negative   Specific Gravity (UA) 1.007   Blood (UA) Negative   pH (UA) 9.0   Protein (UA) >=500   Nitrite (UA) Negative   Leukocyte Esterase (UA) Negative (Result(s) reported on 08 Apr 2012 at 06:33AM.)   RBC (UA) 1 /HPF   WBC (UA) <1 /HPF   Bacteria (UA) NONE SEEN   Epithelial Cells (UA) 7 /HPF (Result(s) reported on 08 Apr 2012 at 06:33AM.)  Routine Hem:  20-Oct-13 04:33    WBC (CBC)  14.7   RBC (CBC)  2.42   Hemoglobin (CBC)  8.0   Hematocrit (CBC)  23.7   Platelet Count (CBC) 255   MCV 98   MCH 32.9   MCHC 33.5   RDW  17.9   Neutrophil % 81.1   Lymphocyte % 5.2   Monocyte % 11.8   Eosinophil % 1.7   Basophil % 0.2   Neutrophil #  12.0   Lymphocyte #  0.8   Monocyte #  1.7   Eosinophil # 0.3   Basophil # 0.0 (Result(s) reported on 08 Apr 2012 at 05:19AM.)    Penicillin: Rash  Contrast dye: Itching, Rash  Shellfish: Rash  Vital Signs/Nurse's Notes: **Vital Signs.:   22-Oct-13 06:20   Vital Signs Type Routine   Temperature Temperature (F) 98.4   Celsius 36.8   Temperature Source Oral   Pulse Pulse 60   Respirations Respirations 18   Systolic BP Systolic BP 1973  Diastolic BP (mmHg) Diastolic BP (mmHg) 71   Mean BP 106   Pulse Ox % Pulse Ox % 91   Pulse Ox Activity Level  At rest   Oxygen Delivery Room Air/ 21 %    09:12   Pulse Pulse 75   Pulse source if not from Vital Sign Device per Telemetry Clerk   Telemetry pattern Cardiac Rhythm Normal sinus rhythm; pattern reported by Telemetry Clerk     Impression 81year old female with known ischemic cardiomyopathy, status post defibrillator for V. fib arrest, history of atrial fibrillation, into stage renal disease, history of renal cancer, currently treated for Donald pain, weight loss, improving on triple antibiotic therapy, with a slow wide-complex  noted on monitor during dialysis.  This complex is believed to represent the patient's paced rhythm, not ventricular tachycardia.  There is no further treatment needed by cardiology.  Patient will continue her amiodarone  200 mg a day as well as  coreg 6.25 mg twice a day and regular iInterrogation of her device in the office as already scheduled.   Patient was  seen in collaboration with Dr. Nehemiah Massed.   Electronic Signatures: Roderic Palau (NP)  (Signed 22-Oct-13 14:33)  Authored: General Aspect/Present Illness, History and Physical Exam, Review of System, Home Medications, Labs, Allergies, Vital Signs/Nurse's Notes, Impression/Plan   Last Updated: 22-Oct-13 14:33 by Roderic Palau (NP)

## 2014-10-10 NOTE — Consult Note (Signed)
PATIENT NAME:  Patricia Woodard, Patricia Woodard MR#:  [id number] DATE OF BIRTH:  [birth year]  DATE OF CONSULTATION:  08/06/2012  REFERRING PHYSICIAN:  Phillips Climes, MD CONSULTING PHYSICIAN:  Isaias Cowman, MD  PRIMARY CARE PHYSICIAN: Dr. Gilford Rile  CHIEF COMPLAINT: Left-sided weakness.   REASON FOR CONSULTATION: Requested for evaluation of cardiomyopathy and congestive heart failure.   HISTORY OF PRESENT ILLNESS: The patient is a 81 year old female with multiple comorbidities including dilated cardiomyopathy status post ICD, end-stage renal disease on chronic hemodialysis, who presents with left-sided weakness. The patient was recently discharged from [facility] on 08/03/2012 with respiratory failure, CHF and pneumonia. On 08/05/2012, the patient experienced left-sided weakness and was brought to [facility] Emergency Room. CT scan was unremarkable. The patient was treated with aspirin and admitted to telemetry. She was anemic on admission with a hemoglobin of 7. The patient denies chest pain. She has chronic exertional dyspnea. Admission labs were notable for BUN and creatinine of 57 and 5.81, respectively. Troponin was 0.35. B-type natriuretic peptide was 30,203.   PAST MEDICAL HISTORY: 1. Dilated cardiomyopathy with LVEF of 15% status post ICD. 2. End-stage renal disease, on chronic hemodialysis.  3. Hypertension.  4. Hyperlipidemia.  5. Renal artery disease.  6. Peripheral arterial disease.  7. History of renal cell carcinoma.   MEDICATIONS: 1. Losartan 100 mg at bedtime.  2. Clonidine 0.1 mg t.i.d.  3. Isosorbide mononitrate 30 mg daily.  4. Amiodarone 200 mg daily. 5. Carvedilol 6.25 mg b.i.d.  6. Amlodipine 10 mg at bedtime.  7. Prinivil 1 daily.  8. Lasix 80 mg daily.  9. Hydralazine 100 mg t.i.d.  10. Tylenol 650 mg p.r.n.  11. Mirtazapine 15 mg at bedtime.  12. Promethazine q. 6 p.r.n.  13. Megestrol 400 mg b.i.d.  14. Nexium 20 mg daily.  15. Levothyroxine 1 daily.  16. Rena-Vite 1 daily.    SOCIAL HISTORY: The patient currently lives with her daughter. She denies tobacco abuse.   FAMILY HISTORY: No immediate family history for coronary disease or myocardial infarction.   REVIEW OF SYSTEMS: CONSTITUTIONAL: No fever or chills.  EYES: No blurry vision.  EARS: No hearing loss.  RESPIRATORY: The patient has chronic exertional dyspnea.  CARDIOVASCULAR: The patient denies chest pain.  GASTROINTESTINAL: No nausea, vomiting, diarrhea or constipation.  GENITOURINARY: No dysuria or hematuria.  ENDOCRINE: No polyuria or polydipsia.  HEMATOLOGICAL: No easy bruising or bleeding.  INTEGUMENT: No rash.  MUSCULOSKELETAL: No arthralgias or myalgias.  NEUROLOGICAL: The patient has left-sided weakness, as described above.  PSYCHIATRIC: The patient denies depression or anxiety.   PHYSICAL EXAMINATION: VITAL SIGNS: Blood pressure 131/68, pulse 59, respirations 19, temperature 98.1 and pulse ox 94%.  HEENT: Pupils equal and reactive to light and accommodation.  NECK: Supple without thyromegaly.  LUNGS: Clear.  CARDIOVASCULAR: Normal JVP. Diffuse PMI. Regular rate and rhythm. Normal S1 and S2. No appreciable gallop, murmur or rub.  ABDOMEN: Soft and nontender. Pulses were intact bilaterally.  EXTREMITIES: There was trace pedal edema.  MUSCULOSKELETAL: Normal muscle tone.  NEUROLOGIC: The patient was alert and oriented x 3. The patient had left upper and left lower extremity weakness.   IMPRESSION: This is a 81 year old female with known history of dilated cardiomyopathy status post implantable cardiac defibrillator who presents with new onset of left-sided weakness consistent with acute cerebrovascular accident.  The patient has borderline elevated troponin likely due to demand/supply ischemia and unlikely due to acute coronary syndrome.   RECOMMENDATIONS: 1. Agree with overall current therapy.  2. Would defer full dose  anticoagulation.  3. Defer further cardiac diagnostics at this time.   4. Resume heart failure medications. Will restart carvedilol at low dose, 3.125 mg b.i.d.  ____________________________ Isaias Cowman, MD ap:sb D: 08/06/2012 12:46:16 ET T: 08/06/2012 13:04:27 ET JOB#: [id number]  cc: Isaias Cowman, MD, <Dictator> Isaias Cowman MD ELECTRONICALLY SIGNED 08/14/2012 15:00

## 2014-10-10 NOTE — Discharge Summary (Signed)
PATIENT NAME:  Patricia Woodard, Patricia Woodard MR#:  [id number] DATE OF BIRTH:  1932/06/06  ADDENDUM   The patient was set to go to the nursing home 09/01/2012. However, during dialysis she developed hypotension with decreased responsiveness. She responded rapidly to IV fluid in bolus. She was monitored overnight. Cardiac enzymes were followed, with some minimal troponin elevation to a degree more consistent with chronic renal failure and not indicative of a cardiac event. Family members report that this has happened multiple times with dialysis, she responds better once they slow the rate down previously.   Nephrology plans to do this at her next dialysis. Currently she feels like she is back to her usual baseline and feels ready to go home. Therefore, she will be discharged to the nursing home today if they will accept, following the same discharge instructions that were sent on 09/01/2012.      ____________________________ Adin Hector, MD bjk:[id number] D: 09/02/2012 10:24:00 ET T: 09/02/2012 10:53:41 ET JOB#: [id number]  cc: Tama High III, MD, <Dictator> Ramonita Lab MD ELECTRONICALLY SIGNED 09/06/2012 12:24

## 2014-10-10 NOTE — H&P (Signed)
PATIENT NAME:  Patricia Woodard, Patricia Woodard MR#:  [id number] DATE OF BIRTH:  [birth year]  DATE OF ADMISSION:  08/21/2012  PRIMARY CARE PHYSICIAN:  Dr. Arnetha Courser. Gilford Rile, M.D. CARDIOLOGIST:  Dr. Nehemiah Massed.  VASCULAR:  Dr. Lucky Cowboy   CHIEF COMPLAINT: Shortness of breath.  The patient is a 81 year old African-American female with multiple medical problems including end-stage renal disease on hemodialysis Tuesdays, Thursdays and Saturdays, hypertension. The patient comes in with progressive increasing shortness of breath without any productive cough or phlegm. She had some dry cough. She denies any chest pain. She went to dialysis. Dialysis was not started given the patient's symptoms and brought to the Emergency Room where she was found to be in acute on chronic respiratory hypoxic respiratory failure secondary to acute on chronic systolic congestive heart failure, flare with BNP of 50,000 and a large left pleural effusion acute on chronic. The patient is being admitted for further evaluation and management.   PAST MEDICAL HISTORY: 1.  Anemia, of chronic kidney disease.  2.  History of MI. 3.  History of renal cell carcinoma.  4.  End-stage renal disease on hemodialysis.  5.  Defibrillator/pacemaker placement.  6.  Type 2 diabetes, diet controlled.  7.  Chronic congestive heart failure, systolic.  8.  GERD.  9.  Arthritis. 10.  Hypothyroidism.  11.  History of hypertension.   PAST SURGICAL HISTORY: 1.  Appendectomy.  2.  Cholecystectomy.  3.  Tubal pregnancy. 4.  Patient had ectopic pregnancy surgery. 5.  Left arm AV shunt placement.   MEDICATIONS: 1.  Amlodipine 10 mg daily.  2.  Aspirin 81 mg daily.  3.  Benadryl 25 mg every 6 hours as needed.  4.  Coreg 6.25 b.i.d.  5.  Clonidine 0.1 mg 3 times a day.  6.  Lasix 80 mg daily.  7.  Hydralazine 100 mg 3 times a day.  8.  Isosorbide, dinitrate 30 mg p.o. daily.  9. Losartan 50 mg p.o. daily.  10. Lovastatin 40 mg p.o. daily.  11. Remeron 15 mg at bedtime.   12. Nexium 40 mg daily.  13. Norco 325/5, 1 tablet 3 times a day.  14. Pacerone 200 mg p.o. daily.  15. Promethazine 25 mg half to 1 tablet p.o. q. 6 p.r.n.  16. Renvela 800 mg 1 tablet 3 times a day.  17. Robaxin 500 mg 1 tablet 2 times a day.  18. Sensipar 30 mg once a day.   ALLERGIES:  CONTRAST DYE, PENICILLIN AND SHELLFISH.   SOCIAL HISTORY: The patient currently is a resident at a rehab place getting rehabilitation. Nonsmoker. Nonalcoholic.   FAMILY HISTORY: Both parents deceased, cause of death is unknown.   REVIEW OF SYSTEMS:  CONSTITUTIONAL: Positive for fatigue and weakness.  EYES: No blurred or double vision. No glaucoma or cataracts.  ENT: No tinnitus, ear pain, hearing loss, postnasal drip.  RESPIRATORY: No cough, wheeze, hemoptysis or chronic COPD. CARDIOVASCULAR: No chest pain, orthopnea, edema or arrhythmia.   GASTROINTESTINAL: No nausea, vomiting, diarrhea or abdominal pain. Positive for GERD.  GENITOURINARY: No dysuria or hematuria.  RESPIRATORY: Positive for shortness of breath.  ENDOCRINE: No polyuria, nocturia or thyroid problems.  HEMATOLOGY: :Chronic anemia. No easy bruising.  SKIN: No acne or rash.  MUSCULOSKELETAL: Positive for arthritis and back pain.  NEUROLOGIC: Positive for CVA with weakness.  PSYCHIATRIC: No anxiety or depression. All other systems reviewed and negative.   PHYSICAL EXAMINATION: GENERAL: The patient is awake, alert, oriented x 3, not in acute distress.  VITAL SIGNS:  She is afebrile. Pulse is 59 to 60 with respirations of 20. Blood pressure is 162/67. Sats are 99% on 2 liters.  HEENT: Atraumatic, normocephalic. Pupils PERRLA, EOMI. Oral mucosa is dry.  NECK: Supple.no JVD.  RESPIRATORY: There are bilateral crackles present up to the mid lungs. There is decreased breath sounds in the left side of the lung. No dullness to percussion on the left side of the lung. No use of respiratory muscles or labored breathing.  CARDIOVASCULAR:   Both the heart sounds are normal. No murmur heard. PMI not lateralized. Chest nontender.  EXTREMITIES:  Feeble pedal pulses, good femoral pulses. No lower extremity edema.  ABDOMEN: Soft but nontender. No organomegaly. NEUROLOGIC:  Grossly intact cranial nerves II through XII. No motor or sensory deficits.  PSYCHIATRIC: The patient is awake, alert, oriented x 3.  B-type natriuretic peptide is 49,000. CK is 556.  CK-MB is 2.3. White count is 7.3, H and H is 7.9 and 25.3. Platelet count is 255. Glucose is 95. BUN is 42, creatinine 5.42, sodium 132. LFTs within normal limits. Albumin is 2.1. Troponin is 0.23.  Chest x-ray consistent with large left pleural effusion. Small area of air to left lung apex noted. There is diffuse interstitial and alveolar opacities in the right lung. Trace right pleural effusion. There is a dual lead cardiac pacer.  Element of congestive heart failure noted.  EKG showed electronic pacemaker changes.   ASSESSMENT AND PLAN: A 81 year old patient with history of end-stage renal disease, dilated cardiomyopathy with ejection fraction of 15%, history of coronary artery disease with myocardial infarction and hypertension comes in with:  1.  Acute on chronic hypoxic respiratory failure due to acute on chronic congestive heart failure flare systolic. Ejection fraction was 15% by previous echocardiogram with pacemaker placed with last echo February 15 was read as EF 60% and severe TR.  The possibly there is an error in reading; again, I am not sure about it. The patient has a pacemaker placement. Clinical picture fits into congestive heart failure with elevated BNP of 50,000 with large left pleural effusion and clinical symptoms suggestive of congestive heart failure. The patient is going to be admitted on telemetry floor. Will continue hemodialysis with ultrafiltration. I will continue her home medications which includes her cardiac medications. Dr. Nehemiah Massed will see the patient in  consultation cycle cardiac enzymes x 3.  2.  Acute on chronic increasing left pleural effusion. The patient was seen by Dr. Genevive Bi in February 2014. No procedures were carried out at that time given her acute stroke; hence, discussed with Dr. Genevive Bi who recommends CT drainage and pleural catheter placement along with possibility of pleurodesis at a later date by Dr. Genevive Bi. Dr. Genevive Bi has been consulted.  3. End-stage renal disease on hemodialysis. Dr. Zollie Scale to see patient. Resume HD with ultrafiltration.  4. Acute cerebrovascular accident right MCA territory with right critical internal carotid artery stenosis in February 2014 ER. The patient per neuro and vascular recommendations were made for patient to be started on anticoagulation. Medications, however, for some reason, the patient was discontinued on aspirin 81 mg only, reason I am not sure. Has not been documented anywhere. The patient was at rehab for acute cerebrovascular accident, left-sided weakness improving slowly per doctor.  The patient is supposed to follow up with Dr. Lucky Cowboy as outpatient per family for consideration of stent placement in the right internal carotid artery. Will have the patient follow-up with Dr. Lucky Cowboy after discharge from the hospital once her congestive  heart failure is improved.  5. Hypertension. Continue home medications, which is losartan, amlodipine and isosorbide dinitrate and hydralazine.  6.  Gastroesophageal reflux disease. Continue Nexium. 7.  Acute on chronic anemia, multifactorial. Hemoglobin was 9.8 on 02/17; today is 7.9. No active bleeding noted. We will consider transfusion given congestive heart failure if symptoms do not improve with hemodialysis or thoracentesis.  Above plan for admission was discussed at length with patient's daughter, who is agreeable to it. It was also discussed with patient. THE PATIENT IS A FULL CODE. This is confirmed with daughter.  CRITICAL TIME SPENT:  60 minutes     ____________________________ Gus Height A. Posey Pronto, MD sap:ce D: 08/21/2012 14:28:34 ET T: 08/21/2012 14:53:36 ET JOB#: [id number]  cc: Sona A. Posey Pronto, MD, <Dictator>  Dr. Arnetha Courser. Gilford Rile, M.D.  Dr. Nehemiah Massed  Dr. Benjaman Lobe MD ELECTRONICALLY SIGNED 08/27/2012 14:42

## 2014-10-10 NOTE — Consult Note (Signed)
Brief Consult Note: Diagnosis: bilateral pleural effusion.   Patient was seen by consultant.   Consult note dictated.   Comments: Bilateral pleural effusions in the setting of renal failure and probable heart failure to some degree.  Given her recent stroke, I would not recommend any surgery.  Would consider percutaneous drainage if needed.  Electronic Signatures: Louis Matte (MD)  (Signed 19-Feb-14 17:40)  Authored: Brief Consult Note   Last Updated: 19-Feb-14 17:40 by Louis Matte (MD)

## 2014-10-10 NOTE — Consult Note (Signed)
Referring Physician:  Albertine Patricia   Primary Care Physician:  Albertine Patricia [place], [address], [place], [place] [address], [place] [phone]  Reason for Consult: Admit Date: 05-Aug-2012  Chief Complaint: L sided weakness   History of Present Illness: History of Present Illness:   81 yo RHD F with multiple medical problems presents with sudden onset of L sided weakness that has an unknown onset time.  This weakness persists today.  ROS:  General denies complaints   HEENT no complaints   Lungs no complaints   Cardiac no complaints   GI no complaints   GU no complaints   Musculoskeletal no complaints   Extremities no complaints   Skin no complaints   Neuro L sided weakness   Endocrine no complaints   Psych no complaints   Past Medical/Surgical Hx:  anemia:   MI - Myocardial Infarct:   Renal Cell Carcinoma:   Dialysis:   ESRD:   Defibrillator inserted:   diabetes:   CHF:   CPR:   Headaches:   MI - Myocardial Infarct (possible 33 yrs ago):   Reflux:   Arrythmias:   Arthritis:   Syncope:   palptations:   Hypothyroidism:   Hypertension:   Pacemaker:   Appendectomy:   Cholecystectomy:   Tubal Pregnancy:   Past Medical/ Surgical Hx:  Past Medical History as above   Past Surgical History as above   Home Medications: Medication Instructions Last Modified Date/Time  megestrol 40 mg/mL oral suspension 10 milliliter(s) orally 2 times a day 16-Feb-14 01:48  acetaminophen 325 mg oral tablet 2 tab(s) orally every 4 hours, As needed, pain or temp. greater than 100.4 16-Feb-14 01:48  promethazine 25 mg oral tablet 1 tab(s) orally every 6 hours, As Needed- for Nausea, Vomiting  16-Feb-14 01:48  losartan 100 mg oral tablet 1 tab(s) orally once a day (at bedtime) 16-Feb-14 01:48  amlodipine 10 mg oral tablet 1 tab(s) orally once a day (at bedtime) 16-Feb-14 01:48  carvedilol 6.25 mg oral tablet 1 tab(s) orally 2  times a day. 16-Feb-14 01:48  furosemide 80 mg oral tablet 1 tab(s) orally once a day 16-Feb-14 01:48  hydrALAZINE 100 mg oral tablet 1 tab(s) orally 3 times a day. 16-Feb-14 01:48  Renvela 800 mg oral tablet 1 tab(s) orally 3 times a day (with meals). 16-Feb-14 01:48  Edarbi 80 mg oral tablet 1 tab(s) orally once a day 16-Feb-14 01:48  mirtazapine 15 mg oral tablet 1 tab(s) orally once a day (at bedtime) 16-Feb-14 01:48  Pacerone 200 mg oral tablet 1 tab(s) orally once a day 16-Feb-14 01:48  levothyroxine 100 mcg (0.1 mg) oral tablet 1 tab(s) orally once a day 16-Feb-14 01:48  clonidine 0.1 mg oral tablet 1 tab(s) orally 3 times a day 16-Feb-14 01:48  isosorbide mononitrate 30 mg oral tablet, extended release 1 tab(s) orally once a day 16-Feb-14 01:48  Nexium 20 mg oral delayed release capsule 1 cap(s) orally once a day 16-Feb-14 01:48  Rena-Vite oral tablet 1 tab(s) orally once a day on dialysis days (Tuesday, Thursday, Saturday) as directed 16-Feb-14 01:48   Allergies:  Penicillin: Rash  Contrast dye: Itching, Rash  Shellfish: Rash  Allergies:  Allergies as above   Social/Family History: Employment Status: disabled  Lives With: children  Living Arrangements: house  Social History: no tob, no EtOH, no illicits  Family History: n/c   Vital Signs: **Vital Signs.:   17-Feb-14 08:00  Temperature Temperature (F) 98.2  Celsius 36.7  Pulse Pulse 63  Respirations Respirations 20  Systolic BP Systolic BP 660  Diastolic BP (mmHg) Diastolic BP (mmHg) 55  Mean BP 74  Pulse Ox % Pulse Ox % 100  Oxygen Delivery 2L; Nasal Cannula   Physical Exam: General: no acute distress, overweight  HEENT: normocephalic, sclera nonicteric, oropharynx clear  Neck: supple, no JVD, no bruits  Chest: crackles on R base, no wheezing, decent movement  Cardiac: irregularrly irregular, no murmur, 2+ pulses  Extremities: no C/C/E, FROM   Neurologic Exam: Mental Status: alert and oriented x 3, normal  speech and language, follows complex commands  Cranial Nerves: PERRLA, EOMI, nl VF, face symmetric, tongue midline, shoulder shrug equal  Motor Exam: 5/5R, 3/5L  Deep Tendon Reflexes: 1+/4 B, downgoing plantars  Sensory Exam: intact to light touch yet has pain when touched on the L foot  Coordination: R finger to nose WNL, unable to test L   Lab Results:  Hepatic:  16-Feb-14 01:23   Bilirubin, Total 0.5  Alkaline Phosphatase 103  SGPT (ALT) 16  SGOT (AST)  42  Total Protein, Serum 6.5  Albumin, Serum  2.2  Routine BB:  16-Feb-14 03:49   Crossmatch Unit 1 Transfused  Result(s) reported on 06 Aug 2012 at 01:51AM.  ABO Group + Rh Type O Positive  Antibody Screen NEGATIVE (Result(s) reported on 05 Aug 2012 at 04:47AM.)  Routine Chem:  16-Feb-14 01:23   B-Type Natriuretic Peptide [facility])  [phone] (Result(s) reported on 05 Aug 2012 at 02:42AM.)  17-Feb-14 00:25   Result Comment TROPONIN - RESULTS VERIFIED BY REPEAT TESTING.  - ELEVATED TROPONIN PREVIOUSLY CALLED AT  - 0255 08/05/12.PMH  Result(s) reported on 06 Aug 2012 at 01:16AM.  Cholesterol, Serum 97  Triglycerides, Serum 74  HDL (INHOUSE)  26  VLDL Cholesterol Calculated 15  LDL Cholesterol Calculated 56 (Result(s) reported on 06 Aug 2012 at 01:16AM.)  Glucose, Serum 98  BUN  31  Creatinine (comp)  4.32  Sodium, Serum 140  Potassium, Serum 3.8  Chloride, Serum 101  CO2, Serum  33  Calcium (Total), Serum 9.3  Anion Gap  6  Osmolality (calc) 286  eGFR (African American)  11  eGFR (Non-African American)  9 (eGFR values <46m/min/1.73 m2 may be an indication of chronic kidney disease (CKD). Calculated eGFR is useful in patients with stable renal function. The eGFR calculation will not be reliable in acutely ill patients when serum creatinine is changing rapidly. It is not useful in  patients on dialysis. The eGFR calculation may not be applicable to patients at the low and high extremes of body sizes, pregnant women, and  vegetarians.)  Cardiac:  17-Feb-14 00:25   CK, Total 36  CPK-MB, Serum 0.8 (Result(s) reported on 06 Aug 2012 at 01:15AM.)  Troponin I  0.58 (0.00-0.05 0.05 ng/mL or less: NEGATIVE  Repeat testing in 3-6 hrs  if clinically indicated. >0.05 ng/mL: POTENTIAL  MYOCARDIAL INJURY. Repeat  testing in 3-6 hrs if  clinically indicated. NOTE: An increase or decrease  of 30% or more on serial  testing suggests a  clinically important change)  Routine Hem:  17-Feb-14 00:25   WBC (CBC) 8.6  RBC (CBC)  3.17  Hemoglobin (CBC)  9.8  Hematocrit (CBC)  30.5  Platelet Count (CBC) 356  MCV 96  MCH 30.9  MCHC 32.2  RDW  18.3  Neutrophil % 78.7  Lymphocyte % 9.5  Monocyte % 8.7  Eosinophil % 1.9  Basophil % 1.2  Neutrophil #  6.8  Lymphocyte #  0.8  Monocyte # 0.8  Eosinophil # 0.2  Basophil # 0.1 (Result(s) reported on 06 Aug 2012 at 01:50AM.)   Radiology Results: [place]:    16-Feb-14 06:23, US Carotid Doppler Bilateral  US Carotid Doppler Bilateral   REASON FOR EXAM:    CVA  COMMENTS:       PROCEDURE: [place]  - US CAROTID DOPPLER BILATERAL  - Aug 05 2012  6:23AM     RESULT:     Technique: Real-time sonographic imaging of the right and left carotid   systems was obtained. Representative static imagingwas presented for   interpretation.    Findings: Mixed plaque is identified within the internal carotid artery   on the right demonstrating 75% to 95% visual stenosis. Calcified plaque   is identified within the carotid bulb demonstrating what appears to be   less than 50% stenosis. There is no appreciable plaque within the common     carotid artery. The left carotid system demonstrates mixed plaque within   the internal carotid artery and carotid bulb demonstrating less than 50%   visual stenosis. There is diffuse intimal thickening within the common   carotid artery demonstrating less than 50% visual stenosis.    Color filling is identified within the left carotid system as well as    unremarkable spectral waveform imaging.    The right carotid system demonstrates color filling of vessels though   there are areas concerning for a component of spectral broadening within   the internal carotid artery on the right secondary to increased laminar   flow. Areas of flow reversal on the right cannot be excluded within the   internal carotid artery.    ICA/CCA ratios:  Right: 6.44  Left: 1.11    Antegrade flow is identified within the right and left carotid arteries.    Regions of high velocity are also identified within the proximal internal  carotid artery on the magnitude of 351 cm/sec. This too is consistent   with the sequela of high-grade stenosis.    IMPRESSION:     1. Findings consistent with high-grade 75% to 95% stenosis within the   right internal carotid artery. Further evaluation with Vascular   Interventional Surgical consultation recommended.   2. No further regions of hemodynamically significant stenosis are     appreciated.    Thank you for the opportunity to contribute to the care of your patient.          Verified By: Mikki Santee, M.D., MD  CT:    16-Feb-14 01:49, CT Head Without Contrast  CT Head Without Contrast   REASON FOR EXAM:    Stroke-Like Symptoms- Focal Neuro Deficit  COMMENTS:       PROCEDURE: CT  - CT HEAD WITHOUT CONTRAST  - Aug 05 2012  1:49AM     RESULT: Technique: Helical noncontrasted 5 mm sections were obtained from   the skull base through thevertex.    Findings: Diffuse cortical and cerebellar atrophy is identified as well   as diffuse areas of low attenuation within the subcortical, deep and   periventricular white matter regions. There is not evidence of   intra-axial nor extra-axial fluid collections, acute hemorrhage, mass   effect, nor a depressed skull fracture. The visualized paranasal sinuses   and mastoid air cells are patent.  IMPRESSION:  Chronic and involutional changes without evidence of acute    abnormalities. If there is persistent clinical concern further evaluation  with MRI is recommended.   2. Dr. Lovena Le of the emergency department was informed of these findings   via a preliminary faxed report.          Thank you for the opportunity to contribute to the care of your patient.        Verified By: Mikki Santee, M.D., MD   Radiology Impression: Radiology Impression: CT of head personally reviewed by me and shows diffuse white matter changes that appear to be chronic however there are subtle changes along the R MCA that look to be small acute infarcts   Impression/Recommendations: Recommendations:   case d/w referring physicians reviewed and showed renal dysfunction   R MCA acute infarct-  very symptomatic and etiology is most likely atrial fibrillation but it could be secondary to R carotid stenosis as well.  Poor prognosis overall for recovery back to baseline strength with multiple medical problems. Congestive heart failure-  poor surgical candidate Atrial fibrillation-  rate controlled R carotid stenosis-  possibly symptomatic but would be poor surgical candidate would change ASA to an newer anticoagulant or coumadin with goal INR of 2-3 stop ASA after two days of newer anticoagulant or when INR reaches 2 adjust lipids for LDL < 100 adjust BP for goal < 130/85 agree with vascular surgery consult and CTA of neck continue PT/OT will follow results, thank you for consult  Electronic Signatures: Jamison Neighbor (MD)  (Signed 17-Feb-14 15:56)  Authored: REFERRING PHYSICIAN, Primary Care Physician, Consult, History of Present Illness, Review of Systems, PAST MEDICAL/SURGICAL HISTORY, HOME MEDICATIONS, ALLERGIES, Social/Family History, NURSING VITAL SIGNS, Physical Exam-, LAB RESULTS, RADIOLOGY RESULTS, Recommendations   Last Updated: 17-Feb-14 15:56 by Jamison Neighbor (MD)

## 2014-10-10 NOTE — H&P (Signed)
PATIENT NAME:  Patricia Woodard, Patricia Woodard MR#:  [id number] DATE OF BIRTH:  01/29/1932  DATE OF ADMISSION:  07/28/2012  HISTORY OF PRESENT ILLNESS: A 81 year old female presents with acute respiratory failure. She has been seen in the outpatient setting and has been on antibiotics. She is on a Tuesday, Thursday, Saturday dialysis schedule. She notes increasing shortness of breath. Her family brought her this morning because of failure of outpatient treatment and progressive dyspnea to the point that her p.o. intake is poor. She is really not doing well at all and has very few to little low breath sounds on the left lung fields.  In the ED, her white count was found to be 21,000, and the chest x-ray shows left small pleural effusion with florid heart failure findings. In light of the combination of pneumonia and for the congestive heart failure, she will be admitted in the face of acute respiratory failure.   PAST MEDICAL HISTORY: Medical illnesses: end-stage renal disease; hypertension; coronary artery disease; cardiomyopathy, ejection fraction 15%.; peripheral vascular disease; renal cell carcinoma; hypothyroidism.   PAST SURGICAL HISTORY: Appendectomy; cholecystectomy; right kidney biopsy; left arm AV shunt placement.   SOCIAL HISTORY: Lives with her family. No smoking, no alcohol, very minimally able to do her own activities of daily living.   MEDICATIONS:  1.  Clonidine 0.1 mg t.i.d.  2.  Imdur 30 mg daily.  3.  Nexium 20 mg daily.  4.  Aspirin 81 mg daily.  5.  Rena-Vite 1 daily.  6.  Losartan 100 mg daily.  7.  Amlodipine 10 mg daily.   Review of systems: Is otherwise negative.   PHYSICAL EXAMINATION:  VITAL SIGNS: Blood pressure 140/75.  HEAD, EYES, EARS, NOSE, AND THROAT:  Benign.  NECK: Three plus JVD. Marland Kitchen  LUNGS: Distant breath sounds on the left, crackles half way up on the right.  HEART: Regular rhythm, 3/6 systolic murmur apex, laterally displaced PMI.  ABDOMEN: Scaphoid, soft, nontender.   EXTREMITIES: Two to 3+ edema and very diminished peripheral pulses.  NEUROLOGIC: Grossly nonfocal. Diffusely weak.   LABS: White count 21,000. Chest x-ray shows small left pleural effusion with bilateral pulmonary edema.   ASSESSMENT AND PLAN:  1.  Acute respiratory failure: Combination of volume overload with ejection fraction 15% and congestive heart failure, plus likely pneumonia. There is according to Radiology very little effusion to tap, and thus we will empirically place her on nosocomial coverage with meropenem and vancomycin with a tap in the future depending on what her chest x-ray looks like post-volume reduction.  2.  End-stage renal disease: Likely needs daily dialysis in light of the overload volume, which is difficult with her low ejection fraction and  pressure tolerances.  3.  Coronary artery disease: No signs of acute ischemia.   Overall prognosis is poor.    ____________________________ Rusty Aus, MD mfm:th D: 07/28/2012 08:53:43 ET T: 07/28/2012 11:20:37 ET JOB#: [id number]  cc: Rusty Aus, MD, <Dictator> Rusty Aus MD ELECTRONICALLY SIGNED 07/29/2012 10:33

## 2014-10-10 NOTE — Discharge Summary (Signed)
PATIENT NAME:  Patricia Woodard, Patricia Woodard MR#:  [id number] DATE OF BIRTH:  08/27/1931  DATE OF ADMISSION:  09/12/2012 DATE OF DISCHARGE:  09/13/2012  ADMITTING DIAGNOSES: 1.  High-grade right carotid artery stenosis with stroke approximately 6 weeks ago.  2.  End-stage renal disease.  3.  Hypertension.  4.  Severe cardiomyopathy with markedly reduced ejection fraction.   DISCHARGE DIAGNOSES:  1.  High-grade right carotid artery stenosis with stroke approximately 6 weeks ago.  2.  End-stage renal disease.  3.  Hypertension.  4.  Severe cardiomyopathy with markedly reduced ejection fraction.   PROCEDURE:  Performed while in the hospital, right carotid artery stent placement.   BRIEF HISTORY: This is this is a 81 year old African American female who had a stroke approximately 6 weeks ago. She has recovered partial neurologic function from this. She still has weakness on the left side, which is pronounced, and some other deficits, but she has improved significantly. She has high-grade right carotid artery stenosis and treatment of this is indicated to avoid subsequent strokes and neurologic complications. Risks and benefits were discussed.   HOSPITAL COURSE: The patient was admitted through same-day surgery and taken to the vascular and interventional radiology suite where a right carotid artery stent placement was performed. At that time, she did well and had no neurologic complications. She was admitted to the ICU overnight for monitoring. She required hemodialysis the following morning in her typical scheduled pattern. She had no major complications. Her dye allergy had been premedicated and she did not have any allergic reactions that were evident.   She will go home on amlodipine 10 mg daily, Nexium 20 mg daily, carvedilol 6.25 mg b.i.d., Lasix 80 mg daily, Imdur 30 mg daily, losartan 50 mg daily, Sensipar 30 mg daily, lovastatin 40 mg at bedtime, Remeron 15 mg at bedtime, multivitamin daily, Zofran as  needed for nausea, amiodarone 200 mg daily, Renvela 3 times a day with meals, hydralazine 50 mg t.i.d., hydrocortisone as needed, Norco for pain and Fentanyl patch both of which she was on previously, ocular lubricant, aspirin 81 mg and Plavix 75 mg daily. Her diet is regular. Her Activity is as tolerated. She will return to office in 3 to 4 weeks to do carotid duplex.  ____________________________ Algernon Huxley, MD jsd:sb D: 09/13/2012 12:18:16 ET T: 09/13/2012 12:37:23 ET JOB#: [id number]  cc: Algernon Huxley, MD, <Dictator> Algernon Huxley MD ELECTRONICALLY SIGNED 09/13/2012 17:00

## 2014-10-10 NOTE — Consult Note (Signed)
Brief Consult Note: Diagnosis: Cardiomyopathy, CHF, borderline elevated troponin, probable demand supply ischemia.   Patient was seen by consultant.   Consult note dictated.   Comments: REC  Agree with current therapy, defer full dose anticoagulation, resume carvedilol, defer further cardiac diagnostics.  Electronic Signatures: Isaias Cowman (MD)  (Signed 17-Feb-14 12:48)  Authored: Brief Consult Note   Last Updated: 17-Feb-14 12:48 by Isaias Cowman (MD)

## 2014-10-10 NOTE — Consult Note (Signed)
PATIENT NAME:  Patricia Woodard, Patricia Woodard MR#:  [id number] DATE OF BIRTH:  [birth year]  DATE OF CONSULTATION:  08/21/2012  REFERRING PHYSICIAN:  Sona A. Posey Pronto, MD CONSULTING PHYSICIAN:  Corey Skains, MD  PRIMARY CARE PHYSICIAN:  Hewitt Blade. Gilford Rile, III, MD  REASON FOR CONSULTATION:  Significant shortness of breath with cardiomyopathy, acute on chronic congestive heart failure, stroke, end-stage renal disease with pleural effusion.   CHIEF COMPLAINT:  "I'm short of breath."   HISTORY OF PRESENT ILLNESS:  This is a 81 year old female with end-stage chronic kidney disease on hemodialysis, hypertension, hyperlipidemia, dilated cardiomyopathy, systolic in nature, anemia, previous myocardial infarction status post defibrillator pacemaker placement in the past has had new onset of shortness of breath. This shortness of breath is multifactorial in nature including pulmonary edema from acute on chronic congestive heart failure as well as pleural effusion which is a large left pleural effusion worse than it was before. The patient has had a recent stroke for which he has recovered relatively well and is stable at this time on appropriate medications including aspirin. Therefore, the patient may need further intervention to help with the above.   REVIEW OF SYSTEMS:  Showing no evidence of significant weakness, fatigue, vision change, ringing in the ears, hearing loss, cough, congestion, heartburn, nausea, vomiting, diarrhea, bloody stools, stomach pain, extremity pain, leg weakness, cramping of the buttocks, known blood clots, headaches, blackouts, dizzy spells, nosebleed, congestion, trouble swallowing, frequent urination, urination at night, muscle weakness, numbness, anxiety, depression, skin lesions or skin rashes.   PAST MEDICAL HISTORY:   1.  Anemia.  2.  Myocardial infarction.  3.  Renal cell carcinoma.  4.  End-stage renal disease.  5.  Defibrillator/pacemaker placement.  6.  Diabetes.  7.  Acute on chronic  congestive heart failure.  8.  Gastroesophageal reflux.   FAMILY HISTORY:  Unknown due to patient's parents being deceased.   SOCIAL HISTORY:  The patient currently denies alcohol or tobacco use.   ALLERGIES:  As listed.   CURRENT MEDICATIONS:  As listed.   PHYSICAL EXAMINATION: VITAL SIGNS: Blood pressure is 162/75 bilaterally, heart rate is 60 upright, reclining and regular.  GENERAL: She is a well-appearing elderly female female in no acute distress.  HEENT: No icterus, thyromegaly, ulcers, hemorrhage or xanthelasma.  CARDIOVASCULAR: Slightly irregular with normal S1, S2 and a 2 out of 6 apical murmur consistent with mitral regurgitation. PMI is diffuse. Carotid upstroke normal without bruit. Jugular venous pressure is normal.  LUNGS: Have significant decreased breath sounds in the left side and right base with some crackles in the right base.  ABDOMEN: Soft and nontender without hepatosplenomegaly or masses. Abdominal aorta is normal sized without bruit.  EXTREMITIES: Shows 2+ femoral, trace dorsal pedal with shunting in the arm.  NEUROLOGIC: The patient is grossly intact. She is oriented to time, place and person.   ASSESSMENT:  A 81 year old female with end-stage renal disease, dilated cardiomyopathy with ejection fraction of 15% with acute on chronic congestive heart failure with severe left pleural effusion and hypoxia which likely will cause an elevated troponin most consistent with demand ischemia needing further treatment options.   RECOMMENDATIONS:   1.  Continue dialysis for significant acute on chronic congestive heart failure.  2.  Left pleural thoracentesis to reduce significant abnormalities and improve shortness of breath. The patient may need to stop aspirin for this. 3.  Continue treatment of peripheral vascular disease including possible aspirin unless needed to stop due to thoracentesis.  4.  No need  for echocardiogram due to known significant LV systolic dysfunction,  valvular heart disease likely clinically unchanged from before.  5.  Continue hypertension control with amlodipine and other outpatient medications which appears to be reasonable at this time.  6.  Serial ECG and enzymes to assess for troponin elevation, extent of demand ischemia versus myocardial infarction.  7.  Further treatment options after above.    ____________________________ Corey Skains, MD bjk:si D: 08/21/2012 16:58:00 ET T: 08/21/2012 17:55:37 ET JOB#: [id number]  cc: Corey Skains, MD, <Dictator> Corey Skains MD ELECTRONICALLY SIGNED 08/22/2012 8:56

## 2014-10-10 NOTE — Discharge Summary (Signed)
PATIENT NAME:  Patricia Woodard, Patricia Woodard MR#:  [id number] DATE OF BIRTH:  1931-12-28  DATE OF ADMISSION:  07/06/2012 DATE OF DISCHARGE:  07/10/2012  HISTORY OF PRESENT ILLNESS AND HOSPITAL COURSE:  Ms. Desantiago is a 81 year old dialysis patient who presented to the Emergency Room complaining of pleuritic chest pain and shortness of breath. She had also had a productive cough. The patient was known to have an ischemic cardiomyopathy and was being followed by Dr. Nehemiah Massed in [facility]. She had a known ejection fraction of 15%. The patient was noted in the Emergency Room to have some minimally elevated troponins most likely due to her chronic renal disease. However, she did have some slight T wave inversion in the lateral leads and was therefore admitted for further evaluation.   The patient's past medical history was notable for end-stage kidney disease for which she received hemodialysis on Tuesdays, Thursdays and Saturdays. The patient also had known hypertension, hyperlipidemia, coronary artery disease, ischemic cardiomyopathy, peripheral arterial disease, hypothyroidism and a history of renal cell carcinoma.   The patient's surgical history included an appendectomy, cholecystectomy, history of defibrillator placement, a right kidney biopsy and left arm AV shunt placement.   The patient's medications at home included losartan 100 mg daily, clonidine 0.1 mg 3 times daily, isosorbide 30 mg daily, amiodarone 200 mg daily, Remeron 15 mg at bedtime, aspirin 81 mg daily, Coreg 6.25 mg 2 times a day, Norvasc 10 mg daily, Lasix 80 mg daily, Renvela 800 mg 3 times daily, Nexium 40 mg daily, Levoxyl 100 mcg daily, hydralazine 100 mg 3 times daily, Rena-Vite 1 tablet on dialysis days and Edarbi 80 mg daily.   The patient was noted to be allergic to penicillin and shellfish and possibly IVP dye.   The patient's admission vital signs revealed a temperature of 98.8, a pulse of 60, respiratory rate of 16 and a blood  pressure of 176/66. Examination as described by the admitting physician was basically unremarkable.   The patient's admission CBC showed a hemoglobin of 9.5 with a hematocrit of 28.4. Platelet count was 218,000. White count was 11,200. Admission comprehensive metabolic panel showed a BUN of 21 with a creatinine of 5.11. Sodium was 134. Estimated GFR was 9. Troponin was 0.12. Admission EKG showed an electronic atrial pacemaker rhythm. Admission chest x-ray showed interstitial pulmonary edema.   The patient was admitted to the regular medical floor where she was placed on telemetry. She was treated for her probable congestive heart failure, mostly through her dialysis. She was also placed on antibiotics for suspected tracheobronchitis. As mentioned she did have serial troponins all of which were slightly elevated and unchanged. It was felt to be demand ischemia due to her chronic renal disease. She was placed on Zithromax for her suspected tracheobronchitis. The patient did show slow but gradual improvement and received dialysis twice while she was in the hospital.   DISCHARGE DIAGNOSES: 1. Acute respiratory failure secondary to acute systolic congestive heart failure.  2. Acute systolic heart failure secondary to chronic renal failure.  3. Acute bronchitis.   DISCHARGE MEDICATIONS: 1. Clonidine 0.1 mg 3 times daily.  2. Isosorbide mononitrate 30 mg daily.  3. Nexium 20 mg daily.  4. Aspirin 81 mg daily.  5. Rena-Vite 1 tablet on dialysis.  6. Promethazine 25 mg every 6 hours as needed for nausea.  7. Losartan 100 mg daily.  8. Amlodipine 10 mg daily.  9. Zithromax 250 mg daily for an additional 4 days.   DISCHARGE DISPOSITION:  The patient was discharged on a renal failure diet with activity as tolerated.   DISCHARGE FOLLOWUP: She is to be followed up in the dialysis unit on her usual day. She will be followed up in the office on a p.r.n. basis.  ____________________________ Hewitt Blade Sarina Ser, MD jbw:sb D: 07/23/2012 12:44:08 ET     T: 07/23/2012 14:40:03 ET        JOB#: [id number] cc: Jenny Reichmann B. Sarina Ser, MD, <Dictator> Lottie Mussel III MD ELECTRONICALLY SIGNED 07/24/2012 7:26

## 2014-10-10 NOTE — H&P (Signed)
PATIENT NAME:  Patricia Woodard, MURALLES MR#:  [id number] DATE OF BIRTH:  May 11, 1932  DATE OF ADMISSION:  07/07/2012  REFERRING PHYSICIAN:  Dr. Charlesetta Ivory.  PRIMARY CARE PHYSICIAN:  Dr. Lisette Grinder.   CHIEF COMPLAINT:  Chest pain.   HISTORY OF PRESENT ILLNESS:  This is a 81 year old female with significant past medical history of end-stage renal disease on hemodialysis Tuesday, Thursday, Saturday, hypertension, hyperlipidemia, coronary artery disease with dilated cardiomyopathy with ejection fraction 15% followed by Dr. Nehemiah Massed, peripheral vascular disease, renal cell carcinoma followed by [facility] urology, hypothyroidism, who presents with complaints of pleuritic chest pain, patient reports she has been complaining with cough over the last few days, with some productive sputum, green in color, with mild shortness of breath, but reports over the last 24 hours she has been having chest pain, which prompted her to come to ED.  In ED, patient describes her chest pain mainly as pleuritic, reproducible when she coughs, as well was reproducible by palpation. The patient had 2  sets of repeat troponin which were around her baseline which are usually mostly elevated due to her end stage renal disease, but she did have some EKG changes from previous EKG where she did have some ST inversion in the lateral leads, so hospitalist service were requested to admit the patient for further evaluation of her chest pain.   REVIEW OF SYSTEMS:  CONSTITUTIONAL:  Denies fever, fatigue, chills, weakness.  EYES:  Denies blurry vision, double vision, inflammation, glaucoma.  EARS, NOSE, THROAT:  Denies tinnitus, ear pain, hearing loss, epistaxis or discharge.  RESPIRATORY:  Complains of cough.  Denies wheezing, hemoptysis.  Has some mild dyspnea, productive cough with purulent sputum.  CARDIOVASCULAR:  Denies edema, palpitations, syncope, arrhythmia.  Had some pleuritic chest pain.  GASTROINTESTINAL:  Denies nausea, vomiting, diarrhea,  abdominal pain, hematemesis, jaundice.  GENITOURINARY:  Denies dysuria, hematuria, renal colic.  ENDOCRINOLOGY:  Denies polyuria, polydipsia, heat or cold intolerance.  HEMATOLOGY:  Denies easy bruising, bleeding diathesis.  INTEGUMENTARY:  Denies acne, rash, or skin lesion.  MUSCULOSKELETAL:  Denies any swelling, gout, arthritis, cramps or limited activity.  NEUROLOGIC:  Denies numbness, weakness, dysarthria, epilepsy, tremors, vertigo, CVA or seizures.  PSYCHIATRIC:  Denies any anxiety, insomnia, schizophrenia, nervousness, bipolar disorder, substance or alcohol abuse.   PAST MEDICAL HISTORY:   1.  End-stage renal disease, on hemodialysis Tuesday, Thursday and Saturday.  2.  Hypertension.  3.  Hypercholesterolemia.  4.  Coronary artery disease.  5.  Cardiomyopathy with ejection fraction of 15% noted by Dr. Nehemiah Massed.  6.  Peripheral arterial disease.  7.  Renal cell carcinoma followed by [facility].  8.  Hypothyroidism.   PAST SURGICAL HISTORY: 1.  Appendectomy.  2.  Cholecystectomy.  3.  Ectopic pregnancy.  4.  History of defibrillator in [place], [place].  5.  Right kidney biopsy in 2010, right kidney radiation following that.  6.  Left arm AV shunt placement.   HOME MEDICATIONS: 1.  Losartan 100 mg oral daily.  2.  Clonidine 0.1 mg 3 times a day.  3.  Isosorbide mononitrate 30 mg oral daily.  4.  Amiodarone 200 mg oral daily.  5.  Mirtazapine 15 mg oral at bedtime.  6.  Promethazine as needed.  7.  Aspirin 81 mg oral daily.  8.  Coreg 6.25 mg oral twice daily.  9.  Norvasc 10 mg oral daily.  10.  Renvela 800 mg oral 3 times a day.  11.  Lasix 80 mg daily.  12.  Nexium 40 mg daily.  13.  Levothyroxine 100 mcg oral daily.  14.  Hydralazine 100 mg oral 3 times a day.  15.  Rena-Vite oral tablet on dialysis days.  16.  Edarbi 80 mg oral daily.   ALLERGIES:  PENICILLIN, SHELLFISH, IV DYE AND SHE IS TOLERATING AMIODARONE WITHOUT ANY DIFFICULTY.   SOCIAL HISTORY:   No alcohol or substance abuse.   FAMILY HISTORY:  Parent's disease, cause of death is unknown.   PHYSICAL EXAMINATION: VITAL SIGNS:  Temperature 98.8, pulse 60, respiratory rate 16, blood pressure 176/66, saturating 100% on oxygen.  GENERAL:  Well-nourished female who looks comfortable, in no apparent distress.  HEENT:  Head atraumatic, normocephalic.  Pupils equal, reactive to light.  Pink conjunctivae.  Anicteric sclerae.  Moist oral mucosa.  NECK:  Supple.  No thyromegaly.  No JVD.  CHEST:  Good air entry bilaterally.  No wheezing, rales, rhonchi.  CARDIOVASCULAR:  S1, S2 heard.  No rubs, murmur, gallops.  ABDOMEN:  Soft, nontender, nondistended.  Bowel sounds present.  EXTREMITIES:  No edema.  No clubbing.  No cyanosis.  PSYCHIATRIC:  Appropriate affect.  Awake, alert x 3.  Intact judgment and insight.  NEUROLOGIC:  Cranial nerves grossly intact.  Motor 5/5.  No focal deficits. SKIN:  Normal skin turgor.  Warm and dry.     PERTINENT LABORATORY DATA:  Glucose 96, BUN 21, creatinine 5.11, sodium 134, potassium 3.8, chloride 100, CO2 23, troponin 0.15 initially, repeat 0.12, white blood cells 11.2, hemoglobin 9.5, hematocrit 28.4, platelets 218.   ASSESSMENT AND PLAN:  This is a 81 year old female with end-stage renal disease on hemodialysis Tuesday, Thursday, Saturday, presents with cough, pleuritic chest pain. She has elevated troponin, but they are elevated at baseline, but does have some T-wave inversions in the lateral leads.  1.  Chest pain.  This appears a typical musculoskeletal quality, as reproducible by palpation and most likely related to her cough, but given the fact her EKG changes, we will admit the patient.  We will continue to cycle her troponins and we will consult cardiology.  The patient is well known to Dr. Nehemiah Massed; patient is already on aspirin, statin, ACE inhibitor and beta-blockers.  2.  Acute bronchitis.  We will start the patient on Zithromax.  3.  End-stage renal  disease.  We will consult nephrology for hemodialysis Tuesday, Thursday, Saturday.  4.  Hypertension.  We will resume patient on her home medication.  5.  Hypercholesterolemia.  Continue with statin.  6.  Hypothyroidism.  Continue with Synthroid.  7.  Deep vein thrombosis prophylaxis.  Subcutaneous heparin.  8.  GI prophylaxis.  PPI.   CODE STATUS:  FULL CODE.    TOTAL TIME SPENT ON ADMISSION AND PATIENT CARE:  Fifty-five minutes.    ____________________________ Albertine Patricia, MD dse:ea D: 07/07/2012 06:00:15 ET T: 07/07/2012 23:36:47 ET JOB#: [id number]  cc: Albertine Patricia, MD, <Dictator> Hartley Wyke Graciela Husbands MD ELECTRONICALLY SIGNED 07/09/2012 22:30

## 2014-10-10 NOTE — H&P (Signed)
PATIENT NAME:  Patricia Woodard, Patricia Woodard MR#:  [id number] DATE OF BIRTH:  [birth year]  DATE OF ADMISSION:  08/05/2012  REFERRING PHYSICIAN: Marta Antu, MD.   PRIMARY CARE PHYSICIAN: Lisette Grinder, MD.   CHIEF COMPLAINT: Left side weakness.   HISTORY OF PRESENT ILLNESS: This is a 81 year old female with known past medical history of end-stage renal disease on hemodialysis Tuesday, Thursday and Saturday, ischemic cardiomyopathy with ejection fraction of 15%, peripheral vascular disease, hypothyroidism, and hyperlipidemia, who presents with left side weakness. The patient was recently discharged from [facility] on the 14th of this month. When she went home, the patient was generally weak. The patient was noticed to have some left side weakness this afternoon by the family, which prompted them to bring her in. They denied any altered mental status or slurred speech, but she had left upper extremity weakness and some mild left lower extremity weakness. In the ED, the patient had CT brain, which did not show an acute finding. The patient was given 324 mg of aspirin for CVA. As well, the patient was found to have a low hemoglobin level of  7, where it was 9.9 on discharge before today, but the patient was Hemoccult-negative in the ED. As well, family denies any melena, any bright red blood per rectum, or any coffee-ground emesis. The patient did not receive hemodialysis for the last 2 days due to the weather. Last dialysis was on Thursday. She did miss her dialysis today and she was supposed to get dialysis on Sunday. The patient is known to have history of a pleural effusion more in the right than the left felt most likely due to her ischemic cardiomyopathy and CHF, but where she had her aspirin held prior to discharge were supposed to be held for a total of 5 days where she is supposed to be getting thoracocentesis next Tuesday. The patient denies any chest pain, any shortness of breath or any altered  mental status. The patient was found to have elevated troponin at 0.35. The patient has baseline elevated troponin of around 0.1 to 0.2 and she denies any chest pain.   PAST MEDICAL HISTORY:  1.  End-stage renal disease on hemodialysis Tuesday, Thursday and Sunday. He did not get hemodialysis on Saturday, she was supposed to get it today on Sunday.  2.  Hypertension.  3.  Hypercholesterolemia.  4.  Renal artery disease.  5.  Cardiomyopathy with ejection fraction of 15% noted by Dr. Nehemiah Massed.  6.  Peripheral arterial disease.  7.  Renal cell carcinoma followed by [facility] urology.  8.  Hypothyroidism.   PAST SURGICAL HISTORY:  1.  Appendectomy.  2.  Cholecystectomy. 3.  Ectopic pregnancy.  4.  History of defibrillator in [place], [place].  5.  Right kidney biopsy in 2010. Right kidney radiation following that.  6.  Left arm AV shunt placement.   HOME MEDICATIONS:  1.  Tylenol 650 as needed.  3.  Losartan 100 mg oral at bedtime.  4.  Clonidine 0.1 mg 3 times a day.  5.  Isosorbide mononitrate 30 mg oral daily.  6.  Amiodarone 200 mg oral daily.  7.  Mirtazapine 15 mg at bedtime.  8.  Promethazine as needed every 6 hours. 9.  Megestrol 400 mg 2 times a day.  10. Coreg 6.25 mg 2 times a day.  11. Amlodipine 10 mg at bedtime.  12. Prinivil 800 mg 1 tablet before meals daily.  13. Lasix 80 mg daily.  14. Nexium  20 mg daily.  15. Levothyroxine   16. Hydralazine 100 mg oral 3 times a day.  17. Rena-Vite 1 tablet oral daily.   ALLERGIES:  1.  CONTRAST DYE.  2.  PENICILLIN.  3.  SHELLFISH.   SOCIAL HISTORY: No alcohol or substance abuse.   FAMILY HISTORY: Both parents are deceased, cause of death is unknown.   REVIEW OF SYSTEMS:  CONSTITUTIONAL: Denies fever or chills. Complains of generalized weakness and fatigue.  EYES: Denies blurry vision, double vision, pain, inflammation or glaucoma.  ENT: Denies tinnitus, ear pain, hearing loss, epistaxis or discharge.  RESPIRATORY:  Denies cough, wheezing or hemoptysis. Has baseline shortness of breath. Denies any COPD or pneumonia.  CARDIOVASCULAR: Denies chest pain, edema, arrhythmia, palpitations or syncope.  GASTROINTESTINAL: Denies nausea, vomiting, diarrhea, abdominal pain, hematemesis, melena, rectal bleed or hemorrhoids.  GENITOURINARY: No dysuria, hematuria or renal colic.  ENDOCRINE: Denies polyuria, polydipsia, heat or cold intolerance. The patient is making urine very minimally; last time on Friday.  HEMATOLOGY: Denies anemia, easy bruising or bleeding diathesis. Family reports she received multiple blood transfusions in the past due to her anemia.  INTEGUMENTARY: Denies acne, rash or skin lesions.  MUSCULOSKELETAL: Denies any gout or cramps. Has limited activity. She is generally weak.  NEUROLOGIC: New onset left side weakness. The family denies any dysarthria, epilepsy, headache, ataxia, tremors or migraine.  PSYCHIATRIC: Denies anxiety, insomnia, substance abuse, alcohol abuse, bipolar disorder or schizophrenia.   PHYSICAL EXAMINATION:  VITAL SIGNS: Temperature 98, pulse 60, respiratory rate 20, blood pressure 92/51, saturating 98% on oxygen.  GENERAL: Elderly female, chronically ill-appearing, looks comfortable in bed in no apparent distress.  HEENT: Head atraumatic, normocephalic. Pupils equal, reactive to light. Pink conjunctivae. Anicteric sclerae. Moist oral mucosa.  NECK: Supple. No thyromegaly. No JVD.  CHEST: Good air entry bilaterally, decreased in the right more than the left. No wheezing, rales or rhonchi.  CARDIOVASCULAR: S1, S2 heard. No rubs, murmur, or gallops.  ABDOMEN: Soft, nontender, nondistended. Bowel sounds present.  EXTREMITIES: No edema, clubbing or cyanosis.  PSYCHIATRIC: Appropriate affect. Awake and alert x 3. Intact judgment and insight.  NEUROLOGIC: Has some minimal left facial droop. Motor left upper extremity 1 to 2 out of 5,  left lower extremity 3 to 4 out of 5 and right upper  and lower extremities 5 out of 5.  SKIN: Normal skin turgor. Warm and dry.   PERTINENT LABORATORIES: Glucose 137, proBNP 3203, BUN 57, creatinine 5.81, sodium 138, potassium 4.2, chloride 99, CO2 29. Troponin 0.35. ALT 16, AST 42, alkaline phosphatase 103. White blood cell 8.7, hemoglobin 7, hematocrit 41.7, platelets 324.   IMAGING: CT of brain showing no evidence of intracranial hemorrhage, mass or acute cortical infarction.   ASSESSMENT AND PLAN:  1.  Cerebrovascular accident. The patient has left upper extremity weakness with a very minimal facial droop. CT of the brain does not show any acute finding. The patient clinically has cerebrovascular accident, so the patient will be admitted to inpatient status. We will check carotid Dopplers. As we will have her on telemetry monitor, we will check echocardiogram as well. The patient cannot have MRI of the brain due to her pacemaker. We will consult neurology service. Given 324 mg of aspirin. We will continue her on aspirin. We will add Crestor for her. We will have PT/OT consulted. We will hold her hypertensive medication. We will allow for systolic blood pressure from 160 to 180.  2.  Anemia. This is most likely due to end-stage  renal disease. The patient is known to have history of multiple transfusions in the past. Her last hemoglobin was 9.9, most likely due to concentration as she did receive multiple dialysis sessions during that hospital stay, where it appears her baseline upon admission was 7.9. The patient is Hemoccult in ED, will be transfused 1 unit of packed red blood cells.  3.  Pleural effusion, workup can be done as an outpatient when the patient is more stable.  4.  End-stage renal disease. Will consult nephrology service to continue dialysis. The patient is supposed to get dialysis today.  5.  Hypertension. Hold all medications due to cerebrovascular accident.  6.  Hypercholesterolemia. Continue with Crestor.  7.  Hypothyroidism.  Continue with Synthroid.  8.  Elevated troponin. The patient does not have any chest pain. She has chronically elevated troponin due to her end-stage renal disease. This may be a little higher as she did not get hemodialysis for the last 48 hours as well due to cerebrovascular accident.  9.  Deep vein thrombosis prophylaxis. Subcutaneous heparin.  10. Gastrointestinal prophylaxis, on Protonix.  11. CODE STATUS: Full code.   TOTAL TIME SPENT ON ADMISSION AND PATIENT CARE: 65 minutes.   ____________________________ Albertine Patricia, MD dse:aw D: 08/05/2012 05:09:54 ET T: 08/05/2012 13:43:09 ET JOB#: [id number]  cc: Albertine Patricia, MD, <Dictator> Daquan Crapps Graciela Husbands MD ELECTRONICALLY SIGNED 08/15/2012 4:26

## 2014-10-10 NOTE — Consult Note (Signed)
PATIENT NAME:  Patricia Woodard, Patricia Woodard MR#:  [id number] DATE OF BIRTH:  Dec 04, 1931  DATE OF CONSULTATION:  07/07/2012  REFERRING PHYSICIAN:   CONSULTING PHYSICIAN:  Dr. Waldron Labs.  REASON FOR CONSULTATION: Acute on chronic congestive heart failure with elevated troponin, chronic kidney disease, hypertension and peripheral vascular disease with bronchitis.   CHIEF COMPLAINT: "I'm short of breath."   HISTORY OF PRESENT ILLNESS: This is a 81 year old female with known coronary artery disease, status post previous myocardial infarction with severe dilated cardiomyopathy, ejection fraction of 15%. She has had hypertension with appropriate medications and significant chronic kidney disease on dialysis. Recently, she has had acute bronchitis, cough and significant elevation of white blood cell counts with fever. At this time, the patient has had a significant pneumonia. Hypoxia has caused significant acute on chronic congestive heart failure and an elevation of troponin of 0.12. There is no evidence of acute coronary syndrome. The patient's EKG shows atrial pacing with poor R wave progression. She does have peripheral vascular disease, which appears to be stable.   REVIEW OF SYSTEMS: The remainder review of systems negative for vision change, ringing in the ears, hearing loss, cough, congestion, heartburn, nausea, vomiting, diarrhea, bloody stools, stomach pain, extremity pain, leg weakness, cramping of the buttocks, known blood clots, headaches, blackouts, dizzy spells, nosebleeds, congestion, trouble swallowing, frequent urination, urination at night, muscle weakness, numbness, anxiety, depression, skin lesions, or skin rashes.   PAST MEDICAL HISTORY:  1.  Coronary artery disease.  2.  Chronic kidney disease.  3.  Hypertension.  4.  Dilated cardiomyopathy.  5.  Peripheral vascular disease.  6.  Pacemaker.   FAMILY HISTORY: No family members with early onset of cardiovascular disease.   SOCIAL HISTORY:  Currently denies alcohol or tobacco use.   ALLERGIES/MEDICATIONS: As listed.   PHYSICAL EXAMINATION:  VITAL SIGNS: Blood pressure 136/68 bilaterally, heart rate 60 upright, reclining, and regular.  GENERAL: She is a well-appearing elderly female in no acute distress.  HEENT: No icterus, thyromegaly, ulcers, hemorrhage, or xanthelasma.  HEART: Regular rate and rhythm. Normal S1 and S2. A 2/6 apical murmur consistent with mitral regurgitation. PMI is diffuse. Carotid upstroke normal without bruit. Jugular venous pressure is normal.  LUNGS: Bibasilar crackles with decreased breath sounds and expiratory wheezes.  ABDOMEN: Soft, nontender, without hepatosplenomegaly or masses. Abdominal aorta is normal size without bruit.  EXTREMITIES: Left arm shunt with minimal peripheral pulses.  NEUROLOGIC: The patient is oriented to time, place, and person with normal mood and affect.   ASSESSMENT: This is a 81 year old female with peripheral vascular disease, hypertension, hyperlipidemia, chronic kidney disease, cardiomyopathy with acute on chronic congestive heart failure with elevation of troponin, more consistent with demand ischemia rather than acute myocardial infarction.   RECOMMENDATIONS:  1.  Continue treatment of bronchitis and other bronchitis treatment due to the fact that the patient has hypoxia likely due to above.  2.  Continue current medical regimen for cardiomyopathy without change. 3.  Dialysis without restriction.  4.  Serial ECG and enzymes to assess for further myocardial infarction.  5.  No further cardiac diagnostics at this time due to known cardiomyopathy and unstable coronary artery disease.     ____________________________ Corey Skains, MD bjk:aw D: 07/10/2012 15:09:43 ET T: 07/10/2012 15:41:40 ET JOB#: [id number]  cc: Corey Skains, MD, <Dictator> Corey Skains MD ELECTRONICALLY SIGNED 07/18/2012 11:10

## 2014-10-10 NOTE — Discharge Summary (Signed)
PATIENT NAME:  Patricia Woodard, Patricia Woodard MR#:  [id number] DATE OF BIRTH:  May 27, 1932  DATE OF ADMISSION:  08/21/2012  DATE OF DISCHARGE:  08/31/2012  HISTORY OF PRESENT ILLNESS:  The patient is a 81 year old black lady with chronic renal disease on dialysis who is having progressive increasing shortness of breath. The patient had recently been in the hospital for volume overload. She also had a nondilated cardiomyopathy. At the time of discharge, she had a left pleural effusion. In the Emergency Room, at the time of the present admission, she was noted to have acute on chronic systolic congestive heart failure with a BNP of 50,000. X-ray also showed a large left pleural effusion. The patient was therefore admitted for further evaluation and treatment.   PAST MEDICAL HISTORY:  Was notable for:  1.  Chronic renal disease with dialysis on Tuesday, Thursday, Saturday.  2.  Anemia of chronic disease.  3.  Coronary artery disease with history of MI.  4.  History of dilated cardiomyopathy.  5.  History of renal cell carcinoma.  6.  History of defibrillator/pacemaker placement.  7.  History of type 2 diabetes.  8.  History of hypothyroidism.  9.  Hypertension.   PAST SURGICAL HISTORY: Included an appendectomy, cholecystectomy and ectopic pregnancy surgery.  The patient also had a history of an AV shunt in the left arm.   MEDICATIONS ON ADMISSION:   Included amlodipine 10 mg daily, aspirin 81 mg daily, Benadryl 25 mg every 6 hours as needed, Coreg 6.25 mg b.i.d., clonidine 0.1 mg t.i.d., Lasix 80 mg daily, hydralazine 100 mg t.i.d., isosorbide 30 mg daily, losartan 50 mg daily, lovastatin 40 mg daily, Remeron 15 mg at bedtime, Nexium 40 mg daily, Norco 5/325 mg 1 tablet every 6 hours p.r.n., Pacerone 200 mg daily, Phenergan 25 mg one half to 1 tablet every 6 hours p.r.n., Renvela 800 mg 3 times a day, Robaxin 500 mg twice a day, Sensipar 30 mg daily.   ALLERGIES:  PENICILLIN, CONTRAST DYE AND SHELLFISH.   ADMISSION  VITAL SIGNS:  Revealed her to be afebrile. Pulse was 60. Respirations 20. Blood pressure 162/67. O2 saturation was 99% on 2 liters.   ADMISSION PHYSICAL EXAMINATION:  As described by the admitting physician was most notable for dry mucous membranes. She was noted to have bilateral crackles to the mid lung field. There were decreased breath sounds to the left side. Cardiovascular exam revealed a regular rhythm. No murmurs or gallops were appreciated. The patient had rather feeble pulses peripherally, but no extremity edema. Neurological exam was grossly physiological.   LABORATORY, DIAGNOSTIC AND RADIOLOGIC DATA: The patient's admission CBC showed a hemoglobin of 7.9 with a hematocrit of 25.3. Admission comprehensive metabolic panel showed a BUN of 42 with a creatinine of 5.42. Electrolytes were notable only for a sodium of 132. Estimated GFR was 8. Albumin was 2.1. Protein was 7.0. BNP was 49,834. Troponin was 0.23. Blood cultures drawn on admission eventually showed no growth. Hepatitis B surface antigen was negative. PTH was 136. EKG showed a paced rhythm with nonspecific ST-T wave changes. Admission chest x-ray showed a large left pleural effusion. There were changes that were felt to be most likely secondary to underlying congestive heart failure. CT scan of the chest showed an enlarging left pleural fluid collection. There was a trace right pleural effusion. There was stable cardiomegaly. No other significant changes such as heart failure were noted on the CT.   HOSPITAL COURSE:  The patient was admitted to the regular  medical floor where she was placed on telemetry. She was seen by pulmonary, cardiology and nephrology. She eventually underwent chest tube placement with drainage of the pleural effusion. She received dialysis several times while she was in the hospital. She also received transfusions as needed. Pleurodesis  was attempted on 03/10 without success. At that point, the invasive radiologist  suggested the chest tube be removed rather than putting in another tube for another attempt. The patient tolerated the procedures well and was stabilized, and the chest tube was removed.   DISCHARGE DIAGNOSES: 1.  Acute on chronic respiratory failure.  2.  End-stage renal disease on hemodialysis.  3.  History of cardiomyopathy.  4.  History of recent cerebrovascular accident.  5.  Hypertension.  6.  Anemia of chronic disease.   DISCHARGE MEDICATIONS: 1.  Tylenol 325 mg 2 tablets every 4 hours p.r.n. pain or temperature.  2.  Amiodarone 200 mg daily.  3.  Amlodipine 10 mg daily.  4.  Coreg 6.25 mg b.i.d.  5.  Sensipar 30 mg daily.  6.  Benadryl 25 mg every 6 hours as needed for itching.  7.  Colace 100 mg b.i.d. p.r.n.  8.  Nexium 20 mg daily.  9.  Furosemide 80 mg daily.  10.  Isosorbide 30 mg daily.  11.  Losartan 50 mg daily.  12.  Lovastatin 40 mg at bedtime.  13.  Remeron 15 mg at bedtime.  14.  Multivitamin 1 daily.  15.  Senokot 1 tablet b.i.d. p.r.n.  16.  Renvela powder 0.8 grams t.i.d. with meals.  17.  Norco 5/325 mg 1 tablet every 4 hours p.r.n.  18.  Fentanyl patch 25 mcg every 3 days.  19.  Hydralazine 50 mg t.i.d.  20.  Zofran 4 mg p.o. q.4 hours p.r.n. nausea.   DISCHARGE DISPOSITION: The patient is being discharged on a renal failure diet. She is to continue on dialysis on Tuesday, Thursday, Saturday. The patient is returning to a skilled nursing facility for chronic care. She does remain a FULL CODE. Her outlook, however, is guarded.    ____________________________ Hewitt Blade Sarina Ser, MD jbw:[id number] D: 08/31/2012 13:04:10 ET T: 08/31/2012 13:15:03 ET JOB#: [id number]  cc: Jenny Reichmann B. Sarina Ser, MD, <Dictator> Lottie Mussel III MD ELECTRONICALLY SIGNED 09/10/2012 20:57

## 2014-10-10 NOTE — Consult Note (Signed)
CHIEF COMPLAINT and HISTORY:  Subjective/Chief Complaint left sided weakness   History of Present Illness Patient with multiple medical comorbidities including ESRD, reduced EF, admitted with left sided weakness which is profound.  Seen while getting repeat head Ct as she can not get MRI with pacer.  Carotid duplex revealed 75-95% right ICA stenosis.  Also has a. fib.  She is a poor historian   PAST MEDICAL/SURGICAL HISTORY:  Past Medical History:   anemia:    MI - Myocardial Infarct: Nov 2009   Renal Cell Carcinoma:    Dialysis:    ESRD:    Defibrillator inserted:    diabetes:    CHF:    CPR:    Headaches:    MI - Myocardial Infarct (possible 33 yrs ago):    Reflux:    Arrythmias:    Arthritis:    Syncope:    palptations:    Hypothyroidism:    Hypertension:    Pacemaker:    Appendectomy: Jan 1950   Cholecystectomy:    Tubal Pregnancy:   ALLERGIES:  Allergies:  Penicillin: Rash  Contrast dye: Itching, Rash  Shellfish: Rash  HOME MEDICATIONS:  Home Medications: Medication Instructions Status  megestrol 40 mg/mL oral suspension 10 milliliter(s) orally 2 times a day Active  acetaminophen 325 mg oral tablet 2 tab(s) orally every 4 hours, As needed, pain or temp. greater than 100.4 Active  promethazine 25 mg oral tablet 1 tab(s) orally every 6 hours, As Needed- for Nausea, Vomiting  Active  losartan 100 mg oral tablet 1 tab(s) orally once a day (at bedtime) Active  amlodipine 10 mg oral tablet 1 tab(s) orally once a day (at bedtime) Active  carvedilol 6.25 mg oral tablet 1 tab(s) orally 2 times a day. Active  furosemide 80 mg oral tablet 1 tab(s) orally once a day Active  hydrALAZINE 100 mg oral tablet 1 tab(s) orally 3 times a day. Active  Renvela 800 mg oral tablet 1 tab(s) orally 3 times a day (with meals). Active  Edarbi 80 mg oral tablet 1 tab(s) orally once a day Active  mirtazapine 15 mg oral tablet 1 tab(s) orally once a day (at bedtime)  Active  Pacerone 200 mg oral tablet 1 tab(s) orally once a day Active  levothyroxine 100 mcg (0.1 mg) oral tablet 1 tab(s) orally once a day Active  clonidine 0.1 mg oral tablet 1 tab(s) orally 3 times a day Active  isosorbide mononitrate 30 mg oral tablet, extended release 1 tab(s) orally once a day Active  Nexium 20 mg oral delayed release capsule 1 cap(s) orally once a day Active  Rena-Vite oral tablet 1 tab(s) orally once a day on dialysis days (Tuesday, Thursday, Saturday) as directed Active   Family and Social History:  Family History Non-Contributory   Social History negative ETOH, negative Illicit drugs   Review of Systems:  Subjective/Chief Complaint left sided weakness, difficult to obtain ROS otherwise as she is a poor historian   Telemetry Reviewed Afib   ROS Pt not able to provide ROS   Medications/Allergies Reviewed Medications/Allergies reviewed   Physical Exam:  GEN thin, chronically ill appearing   HEENT pink conjunctivae, moist oral mucosa   NECK No masses  trachea midline   RESP postive use of accessory muscles  rhonchi  crackles   CARD irregular rate  murmur present  positive carotid bruits   VASCULAR ACCESS AV fistula present  Good bruit  Good thrill   ABD denies tenderness  no liver/spleen enlargement  normal BS   GU ESRD patient   LYMPH negative neck, negative axillae   EXTR negative cyanosis/clubbing, positive edema   SKIN normal to palpation, skin turgor good   NEURO L side weakness, poor historian, difficult to assess   PSYCH poor insight   Additional Comments pacer present left chest Access for dialysis left arm   LABS:  Laboratory Results: Hepatic:    16-Feb-14 01:23, Comprehensive Metabolic Panel  Bilirubin, Total 0.5  Alkaline Phosphatase 103  SGPT (ALT) 16  SGOT (AST) 42  Total Protein, Serum 6.5  Albumin, Serum 2.2  Routine BB:    16-Feb-14 03:49, Crossmatch 1 Unit  Crossmatch Unit 1   Transfused   Result(s) reported  on 06 Aug 2012 at 01:51AM.    16-Feb-14 03:49, Type and Antibody Screen  ABO Group + Rh Type   O Positive  Antibody Screen NEGATIVE  Result(s) reported on 05 Aug 2012 at 04:47AM.  Routine Chem:    16-Feb-14 01:23, B-Type Natriuretic Peptide [facility])  B-Type Natriuretic Peptide [facility]) [phone]  Result(s) reported on 05 Aug 2012 at 02:42AM.    16-Feb-14 01:23, Comprehensive Metabolic Panel  Glucose, Serum 137  BUN 57  Creatinine (comp) 5.81  Sodium, Serum 138  Potassium, Serum 4.2  Chloride, Serum 99  CO2, Serum 29  Calcium (Total), Serum 9.1  Osmolality (calc) 294  eGFR (African American) 7  eGFR (Non-African American) 6  eGFR values <91m/min/1.73 m2 may be an indication of chronic  kidney disease (CKD).  Calculated eGFR is useful in patients with stable renal function.  The eGFR calculation will not be reliable in acutely ill patients  when serum creatinine is changing rapidly. It is not useful in   patients on dialysis. The eGFR calculation may not be applicable  to patients at the low and high extremes of body sizes, pregnant  women, and vegetarians.  Anion Gap 10    16-Feb-14 01:23, Troponin I  Result Comment   TROPONIN - RESULTS VERIFIED BY REPEAT TESTING.   - C/MICHELLE MORTON AT 040972/16/14.[facility]   - READ-BACK PROCESS PERFORMED.   Result(s) reported on 05 Aug 2012 at 02:54AM.    16-Feb-14 10:22, Troponin I  Result Comment   TROPONIN - RESULTS VERIFIED BY REPEAT TESTING.   - PREV. CALLED BY [facility] @ 03532ON 08/05/12   - CAF   Result(s) reported on 05 Aug 2012 at 04:51PM.    17-Feb-14 099:24 Basic Metabolic Panel (w/Total Calcium)  Glucose, Serum 98  BUN 31  Creatinine (comp) 4.32  Sodium, Serum 140  Potassium, Serum 3.8  Chloride, Serum 101  CO2, Serum 33  Calcium (Total), Serum 9.3  Anion Gap 6  Osmolality (calc) 286  eGFR (African American) 11  eGFR (Non-African American) 9  eGFR values <657mmin/1.73 m2 may be an indication of chronic  kidney disease  (CKD).  Calculated eGFR is useful in patients with stable renal function.  The eGFR calculation will not be reliable in acutely ill patients  when serum creatinine is changing rapidly. It is not useful in   patients on dialysis. The eGFR calculation may not be applicable  to patients at the low and high extremes of body sizes, pregnant  women, and vegetarians.    17-Feb-14 00:25, Lipid Profile (ARCapac Cholesterol, Serum 97  Triglycerides, Serum 74  HDL (INHOUSE) 26  VLDL Cholesterol Calculated 15  LDL Cholesterol Calculated 56  Result(s) reported on 06 Aug 2012 at 01:16AM.    17-Feb-14 00:25, Troponin I  Result Comment  TROPONIN - RESULTS VERIFIED BY REPEAT TESTING.   - ELEVATED TROPONIN PREVIOUSLY CALLED AT   - 0255 08/05/12.PM[facility]   Result(s) reported on 06 Aug 2012 at 01:16AM.  Cardiac:    16-Feb-14 01:23, Cardiac Panel  CK, Total 40  CPK-MB, Serum 0.6  Result(s) reported on 05 Aug 2012 at 02:42AM.    16-Feb-14 01:23, Troponin I  Troponin I 0.35  0.00-0.05  0.05 ng/mL or less: NEGATIVE   Repeat testing in 3-6 hrs   if clinically indicated.  >0.05 ng/mL: POTENTIAL   MYOCARDIAL INJURY. Repeat   testing in 3-6 hrs if   clinically indicated.  NOTE: An increase or decrease   of 30% or more on serial   testing suggests a   clinically important change    16-Feb-14 10:22, Cardiac Panel  CK, Total 40  CPK-MB, Serum 0.7  Result(s) reported on 05 Aug 2012 at 04:18PM.    16-Feb-14 10:22, Troponin I  Troponin I 0.41  0.00-0.05  0.05 ng/mL or less: NEGATIVE   Repeat testing in 3-6 hrs   if clinically indicated.  >0.05 ng/mL: POTENTIAL   MYOCARDIAL INJURY. Repeat   testing in 3-6 hrs if   clinically indicated.  NOTE: An increase or decrease   of 30% or more on serial   testing suggests a   clinically important change    17-Feb-14 00:25, Cardiac Panel  CK, Total 36  CPK-MB, Serum 0.8  Result(s) reported on 06 Aug 2012 at 01:15AM.    17-Feb-14 00:25, Troponin I  Troponin  I 0.58  0.00-0.05  0.05 ng/mL or less: NEGATIVE   Repeat testing in 3-6 hrs   if clinically indicated.  >0.05 ng/mL: POTENTIAL   MYOCARDIAL INJURY. Repeat   testing in 3-6 hrs if   clinically indicated.  NOTE: An increase or decrease   of 30% or more on serial   testing suggests a   clinically important change  Routine Hem:    16-Feb-14 01:23, Hemogram, Platelet Count  WBC (CBC) 8.7  RBC (CBC) 2.22  Hemoglobin (CBC) 7.0  Hematocrit (CBC) 21.7  Platelet Count (CBC) 324  Result(s) reported on 05 Aug 2012 at 01:40AM.  MCV 98  MCH 31.5  MCHC 32.2  RDW 18.4    17-Feb-14 00:25, CBC Profile  WBC (CBC) 8.6  RBC (CBC) 3.17  Hemoglobin (CBC) 9.8  Hematocrit (CBC) 30.5  Platelet Count (CBC) 356  MCV 96  MCH 30.9  MCHC 32.2  RDW 18.3  Neutrophil % 78.7  Lymphocyte % 9.5  Monocyte % 8.7  Eosinophil % 1.9  Basophil % 1.2  Neutrophil # 6.8  Lymphocyte # 0.8  Monocyte # 0.8  Eosinophil # 0.2  Basophil # 0.1  Result(s) reported on 06 Aug 2012 at 01:50AM.   RADIOLOGY:  Radiology Results: XRay:    19-Oct-13 09:49, Chest 1 View AP or PA  Chest 1 View AP or PA  REASON FOR EXAM:    abd pain  COMMENTS:       PROCEDURE: DXR - DXR CHEST 1 VIEWAP OR PA  - Apr 07 2012  9:49AM     RESULT: Comparison is made to the study of June 09, 2009.    The cardiac silhouette has increased in size. The pulmonary vascularity   is engorged. The pulmonary interstitial markings are increased. There is   no pleural effusion. A permanent pacemaker defibrillator is present.    IMPRESSION:  The findings are consistent with congestive heart failure   with mild interstitial edema.  There is no focal pneumonia or pleural   effusion.     Dictation Site: 5          Verified By: DAVID A. Martinique, M.D., MD    17-Jan-14 17:54, Chest PA and Lateral  Chest PA and Lateral  REASON FOR EXAM:    cp  COMMENTS:       PROCEDURE: DXR - DXR CHEST PA (OR AP) AND LATERAL  - Jul 06 2012  5:54PM      RESULT: Comparison: 04/07/2012    Findings:  Cardiomegaly and the mediastinum are somewhat to prior. Wires are seen   from dual lead AICD. There are bilateral interstitial pulmonary opacities   which are relatively similar to prior.    IMPRESSION:   Findings which are likely secondary to interstitial pulmonary edema. In   the chronic setting, these could be related to chronic interstitial lung     disease.      Dictation Site: 8        Verified By: Gregor Hams, M.D., MD    20-Jan-14 08:58, Chest PA and Lateral  Chest PA and Lateral  REASON FOR EXAM:    bronchitis  COMMENTS:       PROCEDURE: DXR - DXR CHEST PA (OR AP) AND LATERAL  - Jul 09 2012  8:58AM     RESULT: Comparison is made to the study of 06 July 2012.    Trace left pleural effusion and right pleural effusion present. Cardiac   silhouette is massively enlarged but unchanged. Left-sided   pacemaker/defibrillator appear present with 2 leads in position. There is   diffuse interstitial thickening which could represent interstitial edema.    IMPRESSION:  On and suggestive of congestive heart failure.    Dictation Site: 2    Verified By: Sundra Aland, M.D., MD    08-Feb-14 05:04, Chest Portable Single View  Chest Portable Single View  REASON FOR EXAM:    SOB  COMMENTS:       PROCEDURE: DXR - DXR PORTABLE CHEST SINGLE VIEW  - Jul 28 2012  5:04AM     RESULT: Comparison made to prior study of 07/09/2012. Poor inspiratory   effort. Cardiomegaly with pulmonary vascular prominence and diffuse   interstitial and developing alveolar prominence noted. Left-sided pleural   effusion noted. These finds are consistent with congestive heart failure   with pulmonary edema and left pleural effusion. Cardiac pacer noted.    IMPRESSION:  Congestive heart failure with pulmonary edema and left   pleural effusion.      Verified By: Osa Craver, M.D., MD    11-Feb-14 14:55, Chest PA and Lateral  Chest  PA and Lateral  REASON FOR EXAM:    sob  COMMENTS:       PROCEDURE: DXR - DXR CHEST PA (OR AP) AND LATERAL  - Jul 31 2012  2:55PM     RESULT: Cardiomegaly with bilateral pleural effusions. Underlying   pulmonary edema. Cardiac pacer. Findings have progressed from 07/28/2012.    IMPRESSION:  Massive cardiomegaly. Progressive congestive heart failure   and pulmonary edema and bilateral pleural effusions. Cardiac pacer noted.        Verified By: Osa Craver, M.D., MD    16-Feb-14 01:52, Chest Portable Single View  Chest Portable Single View  REASON FOR EXAM:    shortness of breath  COMMENTS:       PROCEDURE: DXR - DXR PORTABLE CHEST SINGLE VIEW  - Aug 05 2012  1:52AM     RESULT:     Findings: There is diffuse opacification within the left hemithorax with   sparing of the left upper lobe region. Differential considerations are a   large pleural effusion. Etiology such as mass, consolidative lung, are   also of diagnostic consideration. There is diffuse thickening of the   interstitial markings within the aerated portions of the lungs as well as   peribronchial cuffing. The cardiac silhouette appears to be enlarged.   There is blunting of the right costophrenic angle. A left-sided   pectoralis pacing unit is identified with lead tips projecting in the     region of the right atriumand right ventricle. The visualized bony   skeleton is unremarkable.    IMPRESSION:      1. Diffuse consolidation left hemithorax likely representing a large   effusion.  2. Interstitial infiltrate. Differential considerations are pulmonary   edema versus an infectious or inflammatory infiltrate.  3. Surveillance evaluation recommended status post appropriate   therapeutic regimen.  4. Small to trace right effusion.    Thank you for the opportunity to contribute to the care of your patient.     Verified By: Mikki Santee, M.D., MD  [place]:    16-Feb-14 06:23, US Carotid Doppler Bilateral   US Carotid Doppler Bilateral  REASON FOR EXAM:    CVA  COMMENTS:       PROCEDURE: [place]  - US CAROTID DOPPLER BILATERAL  - Aug 05 2012  6:23AM     RESULT:     Technique: Real-time sonographic imaging of the right and left carotid   systems was obtained. Representative static imagingwas presented for   interpretation.    Findings: Mixed plaque is identified within the internal carotid artery   on the right demonstrating 75% to 95% visual stenosis. Calcified plaque   is identified within the carotid bulb demonstrating what appears to be   less than 50% stenosis. There is no appreciable plaque within the common     carotid artery. The left carotid system demonstrates mixed plaque within   the internal carotid artery and carotid bulb demonstrating less than 50%   visual stenosis. There is diffuse intimal thickening within the common   carotid artery demonstrating less than 50% visual stenosis.    Color filling is identified within the left carotid system as well as   unremarkable spectral waveform imaging.    The right carotid system demonstrates color filling of vessels though   there are areas concerning for a component of spectral broadening within   the internal carotid artery on the right secondary to increased laminar   flow. Areas of flow reversal on the right cannot be excluded within the   internal carotid artery.    ICA/CCA ratios:  Right: 6.44  Left: 1.11    Antegrade flow is identified within the right and left carotid arteries.    Regions of high velocity are also identified within the proximal internal  carotid artery on the magnitude of 351 cm/sec. This too is consistent   with the sequela of high-grade stenosis.    IMPRESSION:     1. Findings consistent with high-grade 75% to 95% stenosis within the   right internal carotid artery. Further evaluation with Vascular   Interventional Surgical consultation recommended.   2. No further regions of hemodynamically  significant stenosis are     appreciated.    Thank you for the opportunity to contribute to the care of your patient.  Verified By: Mikki Santee, M.D., MD  LabUnknown:    02-Dec-13 20:43, CT Abdomen and Pelvis Without Contrast  [id number]  REASON FOR EXAM:    (1) RLQ and RMQ tenderness; (2) RLQ and RMQ tenderness  COMMENTS:       PROCEDURE: CT  - CT ABDOMEN AND PELVIS W0  - May 21 2012  8:43PM     RESULT: Findings: Severe stable cardiomegaly and  pericardial effusion.   Cardiac pacer noted. Bilateral pleural effusions. Basilar interstitial   changes noted consistent with pulmonary edema or pneumonitis. Liver   normal. Spleen normal. Pancreas normal. Renal vascular disease. Large   left renal cyst. Small renal calyceal stones. Metallic densities noted   over right kidney. Stable small or bowel aortic aneurysm is present. Mild   ascites. Bladder nondistended. No hydronephrosis. Extensive diffuse   diverticulosis noted. Diverticulitis of the right cannot be completely   excluded. These changes are less marked from prior study of 04/07/2012.   Anasarca.  IMPRESSION:   1. Congestive heart failure. There is severe stable cardiomegaly with   stable pericardial effusion.  2. Mild ascites and anasarca.  3. Extensive diverticulosis with possible changes of diverticulitis on   the right. These changes are not as marked as prior study of 04/07/2012.  4. Stable small  aortic aneurysm.        Verified By: Osa Craver, M.D., MD  PACS Image    17-Jan-14 17:54, Chest PA and Lateral  PACS Image    20-Jan-14 08:58, Chest PA and Lateral  PACS Image    08-Feb-14 05:04, Chest Portable Single View  PACS Image    08-Feb-14 12:47, CT Abdomen and Pelvis Without Contrast  PACS Image    11-Feb-14 14:55, Chest PA and Lateral  PACS Image    12-Feb-14 15:53, CT Chest Without Contrast  PACS Image    16-Feb-14 01:49, CT Head Without Contrast  PACS Image    16-Feb-14 01:52,  Chest Portable Single View  PACS Image    16-Feb-14 06:23, US Carotid Doppler Bilateral  PACS Image  CT:    19-Oct-13 09:49, CT Abdomen and Pelvis Without Contrast  CT Abdomen and Pelvis Without Contrast  REASON FOR EXAM:    (1) abd pain; (2) abd pain  COMMENTS:       PROCEDURE: CT  - CT ABDOMEN AND PELVIS W0  - Apr 07 2012  9:49AM     RESULT: Axial noncontrast CT scanning was performed through the abdomen   and pelvis with reconstructions at 3 mm intervals and slice thicknesses.   Comparison is made to the study of 09 September 2010. Review of multiplanar   reconstructed images was performed separately on the VIA monitor.    The cardiac chambers are enlarged. There is a small pericardial effusion   which has increased in size since 09 September 2010. There are small   bilateral pleural effusions layering posteriorly. Within the abdomen   there is increased density surrounding the ascending and proximal   transverse colon that suggest inflammatory change possibly related to     colitis or diverticulitis. The liver exhibits no focal mass. The   gallbladder is surgically absent. The pancreas, spleen, and adrenal   glands exhibit no acute abnormality. There are vascular calcifications in   both kidneys and there are radiodense clips projecting in the right   kidney. There is no evidence of obstruction. The abdominal aorta exhibits   failure to taper of its caliber. The maximal measured  diameter is 2.5 cm.   Within the pelvis the partially distended urinary bladder demonstrates   mild wall thickening. The uterus is atrophic and situated to the left of   midline. There is a small amount of free fluid in the pelvis. There is a   small fat-containing right inguinal hernia.    The lung bases exhibit emphysematous changes. There is a small amount of   bronchiectasis in the posterior costophrenic gutters.    IMPRESSION:   1. The appearance of the right colon suggests colitis or diverticulitis.    The appendix is not discretely identified but the inflammatory changes   are centered more in the distal ascending colon and proximal transverse   colon than in the pericecal regions. There is no evidence of perforation   or abscess formation.  2. There are chronic changes within both kidneys but there is no evidence   of obstruction.  3. There is no acute hepatobiliary abnormality.  4. There is atherosclerotic failure to taper of the abdominal aorta with   a borderline infrarenal aneurysm measuring 2.5 cm in diameter.   5. There is a small amount of free fluid in the pelvis.  6. There is a small pericardial effusion which has increased in size   since the earlier study. There are also new small bilateral pleural   effusions layering posteriorly. These findings may reflect the patient's   volume status.   Dictation Site: 5          Verified By: DAVID A. Martinique, M.D., MD    08-Feb-14 12:47, CT Abdomen and Pelvis Without Contrast  CT Abdomen and Pelvis Without Contrast  REASON FOR EXAM:    RENAL CELL CA. S/P CRYOBALTION  COMMENTS:       PROCEDURE: CT  - CT ABDOMEN AND PELVIS W0  - Jul 28 2012 12:47PM     RESULT: History: Renal cell cancer. Prior cryoablation.    Comparison Study: CT of 05/21/2012    Findings: Standard nonenhanced scan obtained.. Severe stable   cardiomegaly. Cardiac pacer noted. Tiny pericardial effusion present.   This is stable. Bilateral pleural effusions. Liver normal. Stable small   splenic lesion most likely benign vascular lesion. Adrenalsnormal. Mass   lesion right kidney unchanged. Metallic densities noted in the right   kidney. Bilateral nephrolithiasis. No evidence of urolithiasis. Bladder     is nondistended. No bowel distention. Diffuse diverticulosis noted.    IMPRESSION:   1. Severe cardiomegaly. Cardiac pacer noted. Tiny pericardial effusion   present. These findings are stable.    2. Bilateral pleural effusions .    3. Rounded density in  right cheek right rounded mass right kidney stable.   Metallic densities noted in the right kidney. Stable nephrolithiasis.  4. Diffuse diverticulosis. Small amount of free pelvic fluid. No   inflammatory change in right or left lower quadrant. No evidence of bowel   obstruction or free air.    5. Right lower lobe pneumonia .    Verified By: Osa Craver, M.D., MD    12-Feb-14 15:53, CT Chest Without Contrast  CT Chest Without Contrast  REASON FOR EXAM:    SOB, pneumonia  COMMENTS:       PROCEDURE: CT  - CT CHEST WITHOUT CONTRAST  - Aug 01 2012  3:53PM     RESULT:  Comparison: CT of the abdomen and pelvis 07/28/2012    Technique: Multiple axial images of the chest were obtained without   intravenous contrast.  Findings:  There are a few foci of air in the soft tissues adjacent to the cervical   esophagus at the level of the cricoid cartilage. This is nonspecific.   Clinical correlation is recommended. There is a mildly enlarged   precarinal lymph node which measures 1.5 x 1.1 cm. Evaluation of the hila     is very limited secondary to the adjacent atelectatic lung. No axillary   lymphadenopathy. A stent is demonstrated in the left brachiocephalic   vein. There are multiple leads from AICD device. The heart is enlarged.   There is a small pericardial effusion. There is a moderate sized left   pleural effusion. A small right pleural effusion is present. The main   pulmonary artery is enlarged, as can be seen with pulmonaryarterial   hypertension. There are extensive coronary artery calcifications. The IVC   and hepatic veins are prominent in size, suggesting some degree of right   heart dysfunction.    There is mild centrilobular emphysema. Heterogeneous opacities in the   right lower lobe are likely secondary to atelectasis. Infection is not   excluded. Subpleural mild opacities in the periphery of the right upper   lobe, right lower lobe, and right middle lobe may be  related to   atelectasis. There is marked volume loss and consolidation in the left     lower lobe likely related to atelectasis. Opacities in the left upper   lobe are likely related to atelectasis. The central airways are patent.    There is a mild to moderate compression deformity of the T12 vertebral   body, which is age-indeterminate. There is some sclerosis an lucency   involving part of this vertebral body, which is nonspecific.    IMPRESSION:    1. Moderate left pleural effusion. Mild right pleural effusion.  2. Cardiomegaly. The main pulmonary artery is enlarged. Trace pericardial   effusion.  3. Consolidation and volume loss in the left lower lobe is likely related   to atelectasis. Other opacities in the lungs are likely related to   atelectasis. Infection is not excluded.  4.There are a few small foci of air in the soft tissues of the lower     neck just to the left of midline adjacent to the cervical esophagus.   These are nonspecific. Clinical correlation is suggested.  5. Mild to moderate age indeterminate compressiondeformity of the T12   vertebral body. There is some associated sclerosis and lucency of part of   this vertebral body, which is nonspecific. A pathologic fracture would be   difficult to exclude. Further evaluation could be provided with MRI.      Addendum:  Large low-attenuation lesion in the right kidney is incompletely   characterized, and is indeterminate by density. It is similar to the   prior abdominal CT.    Dictation Site: 8    Verified By: Gregor Hams, M.D., MD    16-Feb-14 01:49, CT Head Without Contrast  CT Head Without Contrast  REASON FOR EXAM:    Stroke-Like Symptoms- Focal Neuro Deficit  COMMENTS:       PROCEDURE: CT  - CT HEAD WITHOUT CONTRAST  - Aug 05 2012  1:49AM     RESULT: Technique: Helical noncontrasted 5 mm sections were obtained from   the skull base through thevertex.    Findings: Diffuse cortical and cerebellar  atrophy is identified as well   as diffuse areas of low attenuation within the subcortical, deep and  periventricular white matter regions. There is not evidence of   intra-axial nor extra-axial fluid collections, acute hemorrhage, mass   effect, nor a depressed skull fracture. The visualized paranasal sinuses   and mastoid air cells are patent.  IMPRESSION:  Chronic and involutional changes without evidence of acute   abnormalities. If there is persistent clinical concern further evaluation   with MRI is recommended.   2. Dr. Lovena Le of the emergency department was informed of these findings   via a preliminary faxed report.          Thank you for the opportunity to contribute to the care of your patient.        Verified By: Mikki Santee, M.D., MD   ASSESSMENT AND PLAN:  Assessment/Admission Diagnosis [place] suggesting 75-95% right ICA stenosis right hemisphetic stroke multiple other issues   Plan needs CTA but allergic. will premedicate and get tomorrow. Poor surgical candidate, but may be a stent candidate   Electronic Signatures: Algernon Huxley (MD)  (Signed 17-Feb-14 11:13)  Authored: Chief Complaint and History, PAST MEDICAL/SURGICAL HISTORY, ALLERGIES, HOME MEDICATIONS, Family and Social History, Review of Systems, Physical Exam, LABS, RADIOLOGY, Assessment and Plan   Last Updated: 17-Feb-14 11:13 by Algernon Huxley (MD)

## 2014-10-10 NOTE — Discharge Summary (Signed)
PATIENT NAME:  Patricia Woodard, Patricia Woodard MR#:  [id number] DATE OF BIRTH:  1931-12-08  DATE OF ADMISSION:  08/05/2012 DATE OF DISCHARGE:  08/09/2012  HISTORY OF PRESENT ILLNESS: The patient is a 81 year old black lady with chronic renal failure requiring hemodialysis on Tuesday, Thursday, and Saturday, who had recently been in the hospital with congestive heart failure and pneumonia. The patient had been discharged home with the plan to come back after a week for a therapeutic tap of a left pleural effusion. Unfortunately, 48 hours after discharge she represented to the Emergency Room with a left hemiplegia. The patient was, therefore, admitted with a diagnosis of acute CVA.   PAST MEDICAL HISTORY:  Notable for hypertension, hyperlipidemia, renal artery disease, dilated cardiomyopathy with a previous ejection fraction of 15%, peripheral arterial disease, hypothyroidism, a history of chronic renal failure and a history of renal cell carcinoma followed at [facility].   PAST SURGICAL HISTORY:  Included an appendectomy, a cholecystectomy, surgery for an ectopic pregnancy, a right kidney biopsy in 2010, and a defibrillator placement that was done in [place].   MEDICATIONS:  The patient's admission medications are well outlined in the patient's dictated admission note.   ALLERGIES: CONTRAST DYE, PENICILLIN, AND SHELLFISH.   ADMISSION PHYSICAL EXAMINATION:  VITAL SIGNS: Temperature 98, pulse 60, respirations 20, and blood pressure 92/51. O2 sat was 98% while on supplemental oxygen.  GENERAL: Exam as described by the admitting physician showed a chronically ill-appearing elderly black female.  LUNGS: Reported to be clear.  CARDIAC: Regular rhythm without murmur or gallop.  ABDOMEN: Soft and nontender.  EXTREMITIES: No edema.  NEUROLOGICAL: The patient was noted to have some facial droop. She was also noted to have a left hemiparesis.  LABORATORY, DIAGNOSTIC AND RADIOLOGIC DATA:  The patient's admission CBC showed a  hemoglobin of 7 with a hematocrit of 21.7. White count was 8700. Platelet count was 324,000. Admission comprehensive metabolic panel showed a random blood sugar of 137. BUN was 57 with a creatinine of 5.81. SGOT was 42. Estimated GFR was 7.0. Troponin was minimally elevated at 0.35. BNP was 30,203. Hepatitis B surface antigen was negative.   EKG showed a paced rhythm. Subsequent echocardiogram showed normal left ventricular function. There was left ventricular hypertrophy. There was severe tricuspid regurgitation with mild mitral regurgitation and mild aortic insufficiency. Admission chest x-ray showed a large left pleural effusion. There was evidence of congestive heart failure. CT of the head done in the Emergency Room showed diffuse cortical and cerebellar atrophy. No acute changes were noted. An MRI could not be done due to the patient having a defibrillator. Follow-up head CT after 24 hours showed increase in hypodensity in the gray-white junction of the right frontal lobe. It was felt to be an ischemic event. Ultrasound of the carotids suggested high-grade stenosis in the right internal carotid artery. Subsequent CTA of the carotids confirmed significant obstruction in the right proximal ICA approaching 75 to 85%.   HOSPITAL COURSE: The patient was admitted to the regular medical floor. She was started back on aspirin by the admitting physician. She did undergo her routine dialysis while she was in the hospital which helped with her congestive heart failure, although her pleural effusion remained unchanged. She was transfused with 1 unit of blood. Her hemoglobin at the time of discharge was 9.6. She was seen in consultation by her cardiologist, but no new recommendations were made. She was seen by the vascular surgeons who plan to bring her back in 2 weeks and put  a stent in the right carotid artery. The patient was also seen in consultation by Neurology, who recommended full-strength anticoagulation once  she had had her surgical procedure. She was also seen by the thoracic surgeons in contemplation that they might be able to drain her pleural effusion, but they did not feel she was a surgical candidate. The patient was also seen while she was in the hospital by Speech Therapy and Physical Therapy. She is being transferred at this time to rehab for further reconditioning prior to her proposed carotid artery stenting.   DISCHARGE DIAGNOSES: 1. Acute cerebrovascular accident of the right frontal lobe.  2. Chronic renal failure with dialysis on Tuesday, Thursday, Saturday.  3. Acute on chronic systolic congestive heart failure.  4. Hypertension.  5. History of right renal carcinoma.  6. Hyperlipidemia.  7. History of dilated cardiomyopathy with a present ejection fraction of 50%.  8. Peripheral vascular disease.  9. Hypothyroidism.   DISCHARGE MEDICATIONS: 1. Tylenol 650 mg every 4 hours as needed for pain.  2. Protonix 40 mg daily.  3. Zofran 4 mg every 6 hours p.r.n. nausea.  4. Amiodarone 200 mg daily.  5. Remeron 15 mg at bedtime.  6. Megace 400 mg b.i.d.  7. Renvela powder 0.8 grams t.i.d.  8. Synthroid 0.1 mg daily.  9. Multivitamin 1 daily.  10. Crestor 10 mg at bedtime.  11. Enteric-coated aspirin 81 mg daily.  12. Coreg 3.125 mg b.i.d.   DISCHARGE DIET: Mechanical soft diet with thin liquids.   OXYGEN:  She is to have oxygen at 2 L/min via nasal cannula on a p.r.n. basis.   FOLLOWUP: The patient will continue to receive physical therapy and occupational therapy while at rehab. It is anticipated that the patient will return in 2 weeks for stenting of the right carotid artery.   CODE STATUS:  The patient remains a FULL CODE.      ____________________________ Hewitt Blade. Sarina Ser, MD jbw:cb D: 08/09/2012 12:39:55 ET T: 08/09/2012 12:58:01 ET JOB#: [id number]  cc: Jenny Reichmann B. Sarina Ser, MD, <Dictator> Lottie Mussel III MD ELECTRONICALLY SIGNED 08/10/2012 7:27

## 2014-10-10 NOTE — Consult Note (Signed)
PATIENT NAME:  Patricia Woodard, PERAGINE MR#:  [id number] DATE OF BIRTH:  1931/12/30  DATE OF CONSULTATION:  08/08/2012  CONSULTING PHYSICIAN:  Anil Havard E. Floy Angert, MD  REASON FOR CONSULTATION:  Bilateral pleural effusions.   REQUESTING PHYSICIAN:  Dr. Lisette Grinder  I have personally seen and examined Patricia Woodard. I have independently reviewed her chest x-rays and CT scans.   HISTORY OF PRESENT ILLNESS: Patricia Woodard is an 81 year old African American female with a history of chronic renal failure on hemodialysis. She also has a history of heart disease and has a pacemaker defibrillator in place. She had a recent echocardiogram revealing normal ventricular systolic function. However, she does have significant pulmonary hypertension and severe tricuspid regurgitation and mild mitral regurgitation. She was admitted to the hospital several days ago after suffering a stroke involving her left side.   She is a frail-appearing, elderly woman in no distress. She is sitting comfortably in a chair. Her lungs showed diminished breath sounds throughout the left side.  Her heart is regular.  PMI is displaced laterally on the left. The right lung is clear.   ASSESSMENT AND PLAN:  I have independently reviewed the patient's x-rays. There is a pleural effusion, larger on the left than on the right. Given her recent stroke and her multiple medical problems, I would not recommend any surgical intervention. I believe if anything is done that an image-guided drain could be placed in her pleural space. I would be happy to assist in management, if needed, at that time. I would not recommend thoracoscopy or chest tube insertion at this point.   Thank you very much for allowing me to participate in her care today.    ____________________________ Lew Dawes. Genevive Bi, MD teo:ce D: 08/08/2012 17:43:02 ET T: 08/08/2012 18:01:07 ET JOB#: [id number]  cc: Christia Reading E. Genevive Bi, MD, <Dictator> Louis Matte MD ELECTRONICALLY SIGNED 08/13/2012 17:41

## 2014-10-10 NOTE — Discharge Summary (Signed)
PATIENT NAME:  Patricia Woodard, Patricia Woodard MR#:  [id number] DATE OF BIRTH:  January 19, 1932  DATE OF ADMISSION:  07/28/2012 DATE OF DISCHARGE:  08/03/2012   HISTORY OF PRESENT ILLNESS:  The patient is a 81 year old black lady with chronic renal failure on hemodialysis, who presented with increasing shortness of breath and poor p.o. intake. In the Emergency Room, she was found to have a white count of 21,000 and her chest x-ray showed florid congestive heart failure with a small left pleural effusion. Her clinical presentation; however, was more of an infectious disease. She had been recently treated for respiratory infection as an outpatient.   PAST MEDICAL HISTORY: Included:  End-stage renal disease, hypertension, coronary artery disease, history of a dilated cardiomyopathy with a previous ejection fraction of 15%, peripheral vascular disease, renal cell carcinoma and hypothyroidism.   PAST SURGICAL HISTORY: Included a previous appendectomy, cholecystectomy, right kidney biopsy and left arm AV shunt placement.   MEDICATIONS ON ADMISSION: Included clonidine 0.1 mg t.i.d., Imdur 30 mg daily, Nexium 20 mg daily, aspirin 81 mg daily, Rena-Vite 1 tablet daily, losartan 100 mg daily and amlodipine 10 mg daily.   The patient's admission vital signs showed a blood pressure of 140/75. Examination, as described by the admitting physician, was most notable for decreased breath sounds on the left. She had crackles midway up on the right. Cardiac exam revealed a grade 3/6 systolic murmur. PMI was laterally displaced. The patient had 2 to 3+ peripheral edema.   The patient's admission chest x-ray showed cardiomegaly with bilateral diffuse infiltrates. There was a left pleural effusion.   Admission CBC showed a hemoglobin of 8.7 with a hematocrit of 27. White count was 21,400. Platelet count was 341,000. Admission comprehensive metabolic panel showed a BUN of 41 with a creatinine of 5.78. Sodium was 132. CO2 was 20. Albumin was 2.7.  Estimated GFR was 7. Phosphorus is 5.7. Troponin was 0.07. PTH was 302.   Electrocardiogram showed atrial fibrillation with nonspecific ST-T wave changes. The patient was noted to be in a paced rhythm thereafter. CT of the abdomen and pelvis showed severe cardiomegaly with tiny pericardial effusion, which was stable. She had bilateral pleural effusions. There was a mass in the right kidney that was stable. There was a right lower lobe pneumonia. Blood cultures on admission eventually showed no growth.   HOSPITAL COURSE: The patient was admitted to the regular hospital floor where she was placed on telemetry. She remained in a paced rhythm throughout most of her hospitalization. Her congestive heart failure was treated with her routine dialysis. She was started on IV antibiotics for her right lower lobe pneumonia. The patient remained relatively hypoxic throughout her hospital stay despite clinical improvement. She was eventually shown to have an enlarging left pleural effusion. She was seen in consultation by pulmonary who thought it ought to be tapped. However, the patient was on aspirin, which they felt needed to be held for 5 days, so this is being postponed until the patient is clinically stable.   DISCHARGE DIAGNOSES: 1.  Acute on chronic systolic congestive heart failure.  2.  Right lower lobe pneumonia.  3.  Chronic renal failure.  4.  Left pleural effusion.  5.  Ischemic cardiomyopathy.   DISCHARGE MEDICATIONS: 1.  Coreg 6.25 mg b.i.d.  2.  Furosemide 80 mg daily.  3.  Hydralazine 100 mg b.i.d.  4.  Renvela  800 mg 3 times a day.  5.  Edarbi 80 mg daily.  6.  Remeron 15 mg at  bedtime.  7.  Pacerone 200 mg daily.  8.  Levoxyl 0.1 mg daily.  9.  Clonidine 0.1 mg t.i.d.  10.  Isosorbide mononitrate 30 mg daily.  11.  Nexium 20 mg daily.  12.  Rena-Vite 1 tablet on dialysis days.  13.  Phenergan 1 tablet every 6 hours as needed for nausea.  14.  Losartan 100 mg daily.  15.  Amlodipine  10 mg daily.  16.  Megace 40 mg/mL 10 mL twice a day.   The patient was advised to hold her aspirin until she can have her thoracentesis.   DISCHARGE DISPOSITION: The patient was discharged home with home health. She is to have home oxygen at 2 L/min. She was discharged on a renal failure diet. The patient is to follow up in 1 to 2 weeks with Dr. Raul Del in pulmonary in order to set up for outpatient thoracentesis.     ____________________________ Hewitt Blade Sarina Ser, MD jbw:cc D: 08/12/2012 12:11:03 ET T: 08/12/2012 17:21:10 ET JOB#: [id number]  cc: Jenny Reichmann B. Sarina Ser, MD, <Dictator> Lottie Mussel III MD ELECTRONICALLY SIGNED 08/27/2012 6:15

## 2014-10-10 NOTE — Discharge Summary (Signed)
PATIENT NAME:  Patricia Woodard, Patricia Woodard MR#:  [id number] DATE OF BIRTH:  05-09-1932  ADDENDUM  DATE OF ADMISSION:  08/21/2012 DATE OF DISCHARGE:  09/01/2012   The patient was to be discharged to a nursing facility on 08/31/2012; however, apparently the nursing facility was not happy with her paperwork. Therefore, the patient could not be discharged. She was re-evaluated in the morning 09/01/2012, and there were no changes in her  clinical status, no medical changes to be made.   DISCHARGE: Reordered on Saturday morning.     ____________________________ Adin Hector, MD bjk:ct D: 09/01/2012 09:41:18 ET T: 09/01/2012 10:18:45 ET JOB#: [id number]  cc: Tama High III, MD, <Dictator> Ramonita Lab MD ELECTRONICALLY SIGNED 09/06/2012 12:23

## 2014-10-10 NOTE — Op Note (Signed)
PATIENT NAME:  Patricia Woodard, Patricia Woodard MR#:  [id number] DATE OF BIRTH:  [birth year]  DATE OF PROCEDURE:  09/12/2012  PREOPERATIVE DIAGNOSES: 1.  Symptomatic right carotid artery stenosis with stroke approximately 6 weeks ago.  2.  End-stage renal disease.  3.  Hypertension.  4.  Coronary artery disease with history of cardiomyopathy.   POSTOPERATIVE DIAGNOSES:  1.  Symptomatic right carotid artery stenosis with stroke approximately 6 weeks ago.  2.  End-stage renal disease.  3.  Hypertension.  4.  Coronary artery disease with history of cardiomyopathy.   PROCEDURES PERFORMED: 1.  Catheter placement into right carotid artery from right femoral approach.  2.  Thoracic aortogram and selective cervical and cerebral right carotid arteriogram.  3.  Placement of an 10 x 8 tapered 4 cm long Xact stent to right carotid artery for use of embolic protection.  4.  StarClose closure device of right femoral artery.   SURGEON: Algernon Huxley, M.D.   ANESTHESIA: Local with moderate conscious sedation.   ESTIMATED BLOOD LOSS: Approximately 50 mL.  CONTRAST USED: Approximately 90 mL Visipaque.   INDICATION FOR PROCEDURE: This is a 81 year old African American female with multiple medical comorbidities. She has severe heart disease. She was admitted about 6 weeks ago with a severe stroke effecting her left side. She has regained partial function of the left arm and leg, but these are still weak and she still has some disability that is not insignificant. She is brought in today for treatment of her high-grade right carotid artery stenosis, which is likely a contributing factor to her stroke.  Risks and benefits of carotid intervention were discussed. 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   DESCRIPTION OF PROCEDURE: The patient is  brought to the vascular and interventional radiology suite. Groins are shaved and prepped and a sterile surgical field is created. The right femoral head was localized with fluoroscopy and the right femoral artery was accessed without difficulty with a micropuncture needle. Micropuncture wire and sheath were then placed and we upsized to a 6-French sheath, in the groin. The patient was systemically heparinized and throughout the procedure a total of 6000 units of intravenous heparin was given for systemic anticoagulation. Her ACT was found to be greater than 280 seconds. A pigtail catheter was then placed in the ascending aorta and LAO projection aortogram was performed. This showed what appeared to be normal arteries of the great vessels. I then cannulated the innominate artery and advanced to the right common carotid artery and there was some significant tortuosity within the carotid artery. I was able to gain access to the external carotid artery and advance the catheter.  I then placed the Amplatz superstiff wire. Over the Amplatz superstiff wire, the 6-French sheath was advanced into the common carotid artery. I was able to cross the lesion without difficulty with the NAV-6 embolic protection device.  The significant tortuosity made this challenging to track a stent and I predilated the lesion with a 4 and then a 5 mm diameter angioplasty balloon to help facilitate tracking of the stent. After performing this, the stent was able to track and cross the lesion. The embolic protection device remained in the distal internal carotid artery. Her stent was a 10 mm diameter proximal x 8 mm diameter distal 4 cm long stent and this was deployed encompassing the lesion traversing  into the internal carotid artery and the proximal portion being in the common carotid artery. This was deployed in excellent location and the filter was then retrieved. Completion angiogram showed the stent to be widely patent with no more than  10% residual stenosis present in the internal carotid artery. The intracerebral filling was brisk and there was good cross filling, right to left, seen as well. There were no defects seen on our imaging. Her neurologic status had not changed throughout the entirety of the procedure either.  At this point, I elected to terminate the procedure.  The sheath was pulled back to the ipsilateral external iliac artery and oblique arteriogram was performed. StarClose closure device was deployed in the usual fashion with excellent hemostatic result. The patient tolerated the procedure well and was taken to the recovery room in stable condition.  ____________________________ Algernon Huxley, MD jsd:sb D: 09/12/2012 12:56:14 ET    T: 09/12/2012 13:18:33 ET        JOB#: [id number] cc: Algernon Huxley, MD, <Dictator> Algernon Huxley MD ELECTRONICALLY SIGNED 09/12/2012 17:13

## 2014-10-13 LAB — SURGICAL PATHOLOGY

## 2014-10-14 ENCOUNTER — Other Ambulatory Visit: Payer: Self-pay | Admitting: Oncology

## 2014-10-14 ENCOUNTER — Ambulatory Visit
Admit: 2014-10-14 | Disposition: A | Discharge status: Home / Self Care | Payer: Self-pay | Attending: Oncology | Admitting: Oncology

## 2014-10-14 DIAGNOSIS — C50912 Malignant neoplasm of unspecified site of left female breast: Secondary | ICD-10-CM

## 2014-10-14 LAB — COMPREHENSIVE METABOLIC PANEL
ALBUMIN: 3.4 g/dL — AB
ANION GAP: 13 (ref 7–16)
Alkaline Phosphatase: 174 U/L — ABNORMAL HIGH
BUN: 33 mg/dL — ABNORMAL HIGH
Bilirubin,Total: 0.7 mg/dL
CALCIUM: 8.5 mg/dL — AB
CHLORIDE: 101 mmol/L
CO2: 25 mmol/L
Creatinine: 6.05 mg/dL — ABNORMAL HIGH
EGFR (Non-African Amer.): 6 — ABNORMAL LOW
GFR CALC AF AMER: 7 — AB
Glucose: 95 mg/dL
Potassium: 4.7 mmol/L
SGOT(AST): 20 U/L
SGPT (ALT): 12 U/L — ABNORMAL LOW
Sodium: 139 mmol/L
Total Protein: 8.7 g/dL — ABNORMAL HIGH

## 2014-10-15 LAB — CANCER ANTIGEN 27.29: CA 27.29: 75.9 U/mL — ABNORMAL HIGH (ref 0.0–38.6)

## 2014-10-22 ENCOUNTER — Other Ambulatory Visit: Payer: Self-pay | Admitting: Family Medicine

## 2014-10-22 DIAGNOSIS — C50212 Malignant neoplasm of upper-inner quadrant of left female breast: Secondary | ICD-10-CM

## 2014-10-23 ENCOUNTER — Other Ambulatory Visit: Payer: Self-pay | Admitting: Family Medicine

## 2014-10-23 ENCOUNTER — Ambulatory Visit
Admission: RE | Admit: 2014-10-23 | Discharge: 2014-10-23 | Disposition: A | Discharge status: Home / Self Care | Payer: Medicare Other | Source: Ambulatory Visit | Attending: Oncology | Admitting: Oncology

## 2014-10-23 DIAGNOSIS — C50212 Malignant neoplasm of upper-inner quadrant of left female breast: Secondary | ICD-10-CM

## 2014-10-23 DIAGNOSIS — C50912 Malignant neoplasm of unspecified site of left female breast: Secondary | ICD-10-CM

## 2014-10-23 HISTORY — DX: Disorder of kidney and ureter, unspecified: N28.9

## 2014-10-23 HISTORY — DX: Malignant (primary) neoplasm, unspecified: C80.1

## 2014-10-23 LAB — GLUCOSE, CAPILLARY: Glucose-Capillary: 87 mg/dL (ref 70–99)

## 2014-10-23 MED ORDER — FLUDEOXYGLUCOSE F - 18 (FDG) INJECTION
11.6900 | Freq: Once | INTRAVENOUS | Status: AC | PRN
  Administered 2014-10-23: 11.69 via INTRAVENOUS

## 2014-10-30 ENCOUNTER — Ambulatory Visit
Admission: RE | Admit: 2014-10-30 | Discharge: 2014-10-30 | Disposition: A | Discharge status: Home / Self Care | Payer: Medicare Other | Source: Ambulatory Visit | Attending: Cardiology | Admitting: Cardiology

## 2014-10-30 ENCOUNTER — Encounter
Admission: RE | Admit: 2014-10-30 | Discharge: 2014-10-30 | Disposition: A | Discharge status: Home / Self Care | Payer: Medicare Other | Source: Ambulatory Visit | Attending: Internal Medicine | Admitting: Internal Medicine

## 2014-10-30 DIAGNOSIS — Z01818 Encounter for other preprocedural examination: Secondary | ICD-10-CM | POA: Diagnosis present

## 2014-10-30 DIAGNOSIS — Z01812 Encounter for preprocedural laboratory examination: Secondary | ICD-10-CM | POA: Diagnosis present

## 2014-10-30 DIAGNOSIS — Z01810 Encounter for preprocedural cardiovascular examination: Secondary | ICD-10-CM | POA: Insufficient documentation

## 2014-10-30 DIAGNOSIS — Z950 Presence of cardiac pacemaker: Secondary | ICD-10-CM

## 2014-10-30 HISTORY — DX: Old myocardial infarction: I25.2

## 2014-10-30 HISTORY — DX: Cardiac arrhythmia, unspecified: I49.9

## 2014-10-30 HISTORY — DX: Infection and inflammatory reaction due to other cardiac and vascular devices, implants and grafts, initial encounter: T82.7XXA

## 2014-10-30 HISTORY — DX: Essential (primary) hypertension: I10

## 2014-10-30 LAB — PROTIME-INR
INR: 1.3
Prothrombin Time: 16.4 seconds — ABNORMAL HIGH (ref 11.4–15.0)

## 2014-10-30 LAB — BASIC METABOLIC PANEL
Anion gap: 15 (ref 5–15)
BUN: 36 mg/dL — ABNORMAL HIGH (ref 6–20)
CALCIUM: 7.9 mg/dL — AB (ref 8.9–10.3)
CO2: 22 mmol/L (ref 22–32)
Chloride: 100 mmol/L — ABNORMAL LOW (ref 101–111)
Creatinine, Ser: 6.6 mg/dL — ABNORMAL HIGH (ref 0.44–1.00)
GFR, EST AFRICAN AMERICAN: 6 mL/min — AB (ref >60)
GFR, EST NON AFRICAN AMERICAN: 5 mL/min — AB (ref >60)
Glucose, Bld: 244 mg/dL — ABNORMAL HIGH (ref 65–99)
Potassium: 4.7 mmol/L (ref 3.5–5.1)
SODIUM: 137 mmol/L (ref 135–145)

## 2014-10-30 LAB — CBC
HCT: 35.1 % (ref 35.0–47.0)
Hemoglobin: 11.2 g/dL — ABNORMAL LOW (ref 12.0–16.0)
MCH: 32.8 pg (ref 26.0–34.0)
MCHC: 31.9 g/dL — ABNORMAL LOW (ref 32.0–36.0)
MCV: 102.8 fL — AB (ref 80.0–100.0)
Platelets: 181 10*3/uL (ref 150–440)
RBC: 3.41 MIL/uL — AB (ref 3.80–5.20)
RDW: 15.6 % — AB (ref 11.5–14.5)
WBC: 2.7 10*3/uL — AB (ref 3.6–11.0)

## 2014-10-30 LAB — APTT: aPTT: 38 seconds — ABNORMAL HIGH (ref 24–36)

## 2014-10-30 LAB — DIFFERENTIAL
Basophils Absolute: 0 10*3/uL (ref 0–0.1)
Basophils Relative: 1 %
Eosinophils Absolute: 0 10*3/uL (ref 0–0.7)
Eosinophils Relative: 0 %
LYMPHS ABS: 0.3 10*3/uL — AB (ref 1.0–3.6)
LYMPHS PCT: 13 %
MONOS PCT: 2 %
Monocytes Absolute: 0.1 10*3/uL — ABNORMAL LOW (ref 0.2–0.9)
Neutro Abs: 2.3 10*3/uL (ref 1.4–6.5)
Neutrophils Relative %: 84 %

## 2014-10-30 NOTE — Patient Instructions (Signed)
  Your procedure is scheduled on: 11/04/14 Report to Day Surgery. To find out your arrival time please call [phone] between 1PM - 3PM on 11/03/14 Remember: Instructions that are not followed completely may result in serious medical risk, up to and including death, or upon the discretion of your surgeon and anesthesiologist your surgery may need to be rescheduled.    __x__ 1. Do not eat food or drink liquids after midnight. No gum chewing or hard candies.     ____ 2. No Alcohol for 24 hours before or after surgery.   ____ 3. Bring all medications with you on the day of surgery if instructed.    __x__ 4. Notify your doctor if there is any change in your medical condition     (cold, fever, infections).     Do not wear jewelry, make-up, hairpins, clips or nail polish.  Do not wear lotions, powders, or perfumes. You may wear deodorant.  Do not shave 48 hours prior to surgery. Men may shave face and neck.  Do not bring valuables to the hospital.    [facility] is not responsible for any belongings or valuables.               Contacts, dentures or bridgework may not be worn into surgery.  Leave your suitcase in the car. After surgery it may be brought to your room.  For patients admitted to the hospital, discharge time is determined by your                treatment team.   Patients discharged the day of surgery will not be allowed to drive home.   Please read over the following fact sheets that you were given:      ____ Take these medicines the morning of surgery with A SIP OF WATER:    1. amLODipine (NORVASC) 10 MG tablet 2. carvedilol (COREG) 6.25 MG tablet  3. cloNIDine (CATAPRES) 0.1 MG tablet  4.esomeprazole (NEXIUM) 20 MG capsule  5.hydrALAZINE (APRESOLINE) 100 MG tablet  6.isosorbide mononitrate (IMDUR) 30 MG 24 hr tablet  7.levothyroxine (SYNTHROID, LEVOTHROID) 100 MCG tablet   8.PACERONE 200 MG tablet   ____ Fleet Enema (as directed)   _x___ Use CHG Soap as  directed  ____ Use inhalers on the day of surgery  ____ Stop metformin 2 days prior to surgery    ____ Take 1/2 of usual insulin dose the night before surgery and none on the morning of surgery.   ____ Stop Coumadin/Plavix/aspirin on  Stop plavix and aspirin today  ____ Stop Anti-inflammatories on    ____ Stop supplements until after surgery.    ____ Bring C-Pap to the hospital.

## 2014-11-04 ENCOUNTER — Ambulatory Visit: Payer: Medicare Other | Admitting: Anesthesiology

## 2014-11-04 ENCOUNTER — Ambulatory Visit
Admission: RE | Admit: 2014-11-04 | Discharge: 2014-11-04 | Disposition: A | Discharge status: Home / Self Care | Payer: Medicare Other | Source: Ambulatory Visit | Attending: Cardiology | Admitting: Cardiology

## 2014-11-04 ENCOUNTER — Encounter: Payer: Self-pay | Admitting: *Deleted

## 2014-11-04 ENCOUNTER — Encounter
Admission: RE | Disposition: A | Discharge status: Home / Self Care | Payer: Self-pay | Source: Ambulatory Visit | Attending: Cardiology

## 2014-11-04 DIAGNOSIS — Z9049 Acquired absence of other specified parts of digestive tract: Secondary | ICD-10-CM | POA: Insufficient documentation

## 2014-11-04 DIAGNOSIS — I420 Dilated cardiomyopathy: Secondary | ICD-10-CM | POA: Insufficient documentation

## 2014-11-04 DIAGNOSIS — E039 Hypothyroidism, unspecified: Secondary | ICD-10-CM | POA: Insufficient documentation

## 2014-11-04 DIAGNOSIS — I2510 Atherosclerotic heart disease of native coronary artery without angina pectoris: Secondary | ICD-10-CM | POA: Insufficient documentation

## 2014-11-04 DIAGNOSIS — N189 Chronic kidney disease, unspecified: Secondary | ICD-10-CM | POA: Diagnosis not present

## 2014-11-04 DIAGNOSIS — I252 Old myocardial infarction: Secondary | ICD-10-CM | POA: Insufficient documentation

## 2014-11-04 DIAGNOSIS — Z9889 Other specified postprocedural states: Secondary | ICD-10-CM | POA: Diagnosis not present

## 2014-11-04 DIAGNOSIS — I495 Sick sinus syndrome: Secondary | ICD-10-CM | POA: Insufficient documentation

## 2014-11-04 DIAGNOSIS — E785 Hyperlipidemia, unspecified: Secondary | ICD-10-CM | POA: Diagnosis not present

## 2014-11-04 DIAGNOSIS — Z85528 Personal history of other malignant neoplasm of kidney: Secondary | ICD-10-CM | POA: Insufficient documentation

## 2014-11-04 DIAGNOSIS — I129 Hypertensive chronic kidney disease with stage 1 through stage 4 chronic kidney disease, or unspecified chronic kidney disease: Secondary | ICD-10-CM | POA: Diagnosis not present

## 2014-11-04 DIAGNOSIS — I5022 Chronic systolic (congestive) heart failure: Secondary | ICD-10-CM | POA: Insufficient documentation

## 2014-11-04 DIAGNOSIS — Z4502 Encounter for adjustment and management of automatic implantable cardiac defibrillator: Secondary | ICD-10-CM | POA: Diagnosis not present

## 2014-11-04 DIAGNOSIS — Z7902 Long term (current) use of antithrombotics/antiplatelets: Secondary | ICD-10-CM | POA: Insufficient documentation

## 2014-11-04 DIAGNOSIS — Z79899 Other long term (current) drug therapy: Secondary | ICD-10-CM | POA: Insufficient documentation

## 2014-11-04 DIAGNOSIS — I739 Peripheral vascular disease, unspecified: Secondary | ICD-10-CM | POA: Insufficient documentation

## 2014-11-04 DIAGNOSIS — Z87891 Personal history of nicotine dependence: Secondary | ICD-10-CM | POA: Insufficient documentation

## 2014-11-04 DIAGNOSIS — Z7982 Long term (current) use of aspirin: Secondary | ICD-10-CM | POA: Diagnosis not present

## 2014-11-04 DIAGNOSIS — I4891 Unspecified atrial fibrillation: Secondary | ICD-10-CM | POA: Diagnosis not present

## 2014-11-04 HISTORY — PX: ICD LEAD REMOVAL: SHX5855

## 2014-11-04 SURGERY — REMOVAL, ELECTRODE LEAD, ICD
Anesthesia: Monitor Anesthesia Care

## 2014-11-04 MED ORDER — LIDOCAINE 1 % OPTIME INJ - NO CHARGE
INTRAMUSCULAR | Status: DC | PRN
  Administered 2014-11-04: 27 mL

## 2014-11-04 MED ORDER — MIDAZOLAM HCL 2 MG/2ML IJ SOLN
INTRAMUSCULAR | Status: DC | PRN
  Administered 2014-11-04 (×2): 0.5 mg via INTRAVENOUS

## 2014-11-04 MED ORDER — CLARITHROMYCIN 250 MG PO TABS: 250.0000 mg | ORAL_TABLET | Freq: Two times a day (BID) | ORAL | Status: DC

## 2014-11-04 MED ORDER — PROPOFOL INFUSION 10 MG/ML OPTIME
INTRAVENOUS | Status: DC | PRN
  Administered 2014-11-04: 25 ug/kg/min via INTRAVENOUS

## 2014-11-04 MED ORDER — SODIUM CHLORIDE 0.9 % IR SOLN
Status: DC | PRN
  Administered 2014-11-04: 200 mL

## 2014-11-04 MED ORDER — SODIUM CHLORIDE 0.9 % IV SOLN
INTRAVENOUS | Status: DC
  Administered 2014-11-04: 12:00:00 via INTRAVENOUS

## 2014-11-04 MED ORDER — SODIUM CHLORIDE 0.9 % IR SOLN
Freq: Once | Status: DC
  Filled 2014-11-04: qty 2

## 2014-11-04 MED ORDER — FENTANYL CITRATE (PF) 100 MCG/2ML IJ SOLN: 25.0000 ug | INTRAMUSCULAR | Status: DC | PRN

## 2014-11-04 MED ORDER — PHENYLEPHRINE HCL 10 MG/ML IJ SOLN
INTRAMUSCULAR | Status: DC | PRN
  Administered 2014-11-04 (×3): 50 ug via INTRAVENOUS

## 2014-11-04 MED ORDER — GENTAMICIN SULFATE 40 MG/ML IJ SOLN
INTRAMUSCULAR | Status: AC
  Filled 2014-11-04: qty 2

## 2014-11-04 MED ORDER — VANCOMYCIN HCL IN DEXTROSE 1-5 GM/200ML-% IV SOLN
1000.0000 mg | Freq: Once | INTRAVENOUS | Status: AC
  Administered 2014-11-04: 1000 mg via INTRAVENOUS
  Filled 2014-11-04: qty 200

## 2014-11-04 MED ORDER — ACETAMINOPHEN 325 MG PO TABS: 325.0000 mg | ORAL_TABLET | ORAL | Status: DC | PRN

## 2014-11-04 MED ORDER — ONDANSETRON HCL 4 MG/2ML IJ SOLN: 4.0000 mg | Freq: Four times a day (QID) | INTRAMUSCULAR | Status: DC | PRN

## 2014-11-04 MED ORDER — FENTANYL CITRATE (PF) 100 MCG/2ML IJ SOLN
INTRAMUSCULAR | Status: DC | PRN
  Administered 2014-11-04 (×3): 25 ug via INTRAVENOUS

## 2014-11-04 MED ORDER — ONDANSETRON HCL 4 MG/2ML IJ SOLN
4.0000 mg | Freq: Once | INTRAMUSCULAR | Status: DC | PRN
Start: 2014-11-04 — End: 2014-11-04

## 2014-11-04 MED ORDER — PROPOFOL 10 MG/ML IV BOLUS
INTRAVENOUS | Status: DC | PRN
  Administered 2014-11-04: 20 mg via INTRAVENOUS

## 2014-11-04 SURGICAL SUPPLY — 46 items
BAG DECANTER STRL (MISCELLANEOUS) IMPLANT
BLADE SURG SZ10 CARB STEEL (BLADE) IMPLANT
CANISTER SUCT 1200ML W/VALVE (MISCELLANEOUS) ×3 IMPLANT
CHLORAPREP W/TINT 26ML (MISCELLANEOUS) ×3 IMPLANT
CLOSURE WOUND 1/2 X4 (GAUZE/BANDAGES/DRESSINGS) ×1
COVER LIGHT HANDLE STERIS (MISCELLANEOUS) ×6 IMPLANT
DEVICE DISSECT PLASMABLAD 3.0S (MISCELLANEOUS) ×1 IMPLANT
DRAPE C-ARM XRAY 36X54 (DRAPES) IMPLANT
DRAPE INCISE IOBAN 66X45 STRL (DRAPES) ×3 IMPLANT
DRAPE PED LAPAROTOMY (DRAPES) ×3 IMPLANT
DRESSING TELFA 4X3 1S ST N-ADH (GAUZE/BANDAGES/DRESSINGS) ×3 IMPLANT
DRSG TEGADERM 4X4.75 (GAUZE/BANDAGES/DRESSINGS) ×3 IMPLANT
DRSG TEGADERM 6X8 (GAUZE/BANDAGES/DRESSINGS) IMPLANT
EVERA XT DR DDBB1D1 ×3 IMPLANT
GLOVE BIO SURGEON STRL SZ8 (GLOVE) ×9 IMPLANT
GLOVE EXAM NITRILE PF MED BLUE (GLOVE) ×3 IMPLANT
GOWN STRL REUS W/ TWL LRG LVL3 (GOWN DISPOSABLE) ×1 IMPLANT
GOWN STRL REUS W/ TWL XL LVL3 (GOWN DISPOSABLE) ×1 IMPLANT
GOWN STRL REUS W/TWL LRG LVL3 (GOWN DISPOSABLE) ×2
GOWN STRL REUS W/TWL XL LVL3 (GOWN DISPOSABLE) ×2
GRADUATE 1200CC STRL 31836 (MISCELLANEOUS) IMPLANT
ICD EVERA XT DR DDBB1D1 (ICD Generator) ×3 IMPLANT
IV NS 1000ML (IV SOLUTION) ×2
IV NS 1000ML BAXH (IV SOLUTION) ×1 IMPLANT
KIT RM TURNOVER STRD PROC AR (KITS) ×3 IMPLANT
NDL SAFETY 25GX1.5 (NEEDLE) IMPLANT
NEEDLE FILTER BLUNT 18X 1/2SAF (NEEDLE) ×2
NEEDLE FILTER BLUNT 18X1 1/2 (NEEDLE) ×1 IMPLANT
NEEDLE SPNL 22GX3.5 QUINCKE BK (NEEDLE) IMPLANT
NS IRRIG 500ML POUR BTL (IV SOLUTION) ×3 IMPLANT
PACK BASIN MINOR ARMC (MISCELLANEOUS) IMPLANT
PACK PACE INSERTION (MISCELLANEOUS) ×3 IMPLANT
PAD GROUND ADULT SPLIT (MISCELLANEOUS) ×3 IMPLANT
PAD STATPAD (MISCELLANEOUS) ×3 IMPLANT
PLASMABLADE 3.0S (MISCELLANEOUS) ×3
STRAP SAFETY BODY (MISCELLANEOUS) ×3 IMPLANT
STRIP CLOSURE SKIN 1/2X4 (GAUZE/BANDAGES/DRESSINGS) ×2 IMPLANT
SUT SILK 2 0 SH (SUTURE) IMPLANT
SUT VIC AB 2-0 CT1 27 (SUTURE)
SUT VIC AB 2-0 CT1 TAPERPNT 27 (SUTURE) IMPLANT
SUT VIC AB 2-0 CT2 27 (SUTURE) IMPLANT
SUT VIC AB 3-0 PS2 18 (SUTURE) IMPLANT
SUT VIC AB 4-0 PS2 18 (SUTURE) ×3 IMPLANT
SYR BULB IRRIG 60ML STRL (SYRINGE) ×3 IMPLANT
SYR CONTROL 10ML (SYRINGE) IMPLANT
SYRINGE 10CC LL (SYRINGE) ×3 IMPLANT

## 2014-11-04 NOTE — Progress Notes (Signed)
Report given to Floyce Stakes RN by Phillips Grout, RN

## 2014-11-04 NOTE — Discharge Instructions (Addendum)
AMBULATORY SURGERY  DISCHARGE INSTRUCTIONS   1) The drugs that you were given will stay in your system until tomorrow so for the next 24 hours you should not:  A) Drive an automobile B) Make any legal decisions C) Drink any alcoholic beverage   2) You may resume regular meals tomorrow.  Today it is better to start with liquids and gradually work up to solid foods.  You may eat anything you prefer, but it is better to start with liquids, then soup and crackers, and gradually work up to solid foods.   3) Please notify your doctor immediately if you have any unusual bleeding, trouble breathing, redness and pain at the surgery site, drainage, fever, or pain not relieved by medication    Additional Instructions:Do not lay on operative side for 24 hours Bed Rest with Bathroom Privileges until AM May remove dressing in AM and wash over area   Pacemaker Battery Change A pacemaker battery usually lasts 4 to 12 years. Once or twice per year, you will be asked to visit your health care provider to have a full evaluation of your pacemaker. When a battery needs to be replaced, the entire pacemaker is replaced so that you can benefit from new circuitry and any new features that have been added to pacemakers. Most often, this procedure is very simple because the leads are already in place.  There are many things that affect how long a pacemaker battery will last, including:   The age of the pacemaker.   The number of leads (1, 2, or 3).   The pacemaker work load. If the pacemaker is helping the heart more often, the battery will not last as long as it would if the pacemaker did not need to help the heart.   Power (voltage) settings. LET [facility] CARE PROVIDER KNOW ABOUT:   Any allergies you have.   All medicines you are taking, including vitamins, herbs, eye drops, creams, and over-the-counter medicines.   Previous problems you or members of your family have had with the use of  anesthetics.   Any blood disorders you have.   Previous surgeries you have had, especially since your last pacemaker placement.   Medical conditions you have.   Possibility of pregnancy, if this applies.  Symptoms of chest pain, trouble breathing, palpitations, light-headedness, or feelings of an abnormal or irregular heartbeat. RISKS AND COMPLICATIONS  Generally, this is a safe procedure. However, as with any procedure, problems can occur and include:   Bleeding.   Bruising of the skin around where the incision was made.   Pain at the incision site.   Pulling apart of the skin at the incision site.   Infection.   Allergic reaction to anesthetics or other medicines used during the procedure.  People with diabetes may have a temporary increase in their blood sugar after any surgical procedure.  BEFORE THE PROCEDURE   Wash all of the skin around the area of the chest where the pacemaker is located.   Ask your health care provider for help with any medicine adjustments before the pacemaker is replaced.   Do not eat or drink anything after midnight on the night before the procedure or as directed by your health care provider.  Ask your health care provider if you can take a sip of water with any approved medicines the morning of the procedure. PROCEDURE   After giving medicine to numb the skin (local anesthetic), your health care provider will make a cut  to reopen the pocket holding the pacemaker.   The old pacemaker will be disconnected from its leads.   The leads will be tested.   If needed, the leads will be replaced. If the leads are functioning properly, the new pacemaker may be connected to the existing leads.  A heart monitor and the pacemaker programmer will be used to make sure that the new pacemaker is working properly.  The incision site will then be closed. A dressing will be placed over the pacemaker site. The dressing will be removed 24-48 hours  afterward. AFTER THE PROCEDURE   You will be taken to a recovery area after the new pacemaker implant is completed. Your vital signs such as blood pressure, heart rate, breathing, and oxygen levels will be monitored.  Your health care provider will tell you when you will need to next test your pacemaker or when to return to the office for follow-up for removal of stitches. Document Released: 09/14/2006 Document Revised: 10/21/2013 Document Reviewed: 12/19/2012 [facility] Patient Information 2015 [place], [place]. This information is not intended to replace advice given to you by your health care provider. Make sure you discuss any questions you have with your health care provider.     [facility] [place] [facility] [place]

## 2014-11-04 NOTE — Op Note (Signed)
[facility] Card[facility]   11/04/2014                     1:38 PM  PATIENT:  Patricia Woodard    PRE-OPERATIVE DIAGNOSIS:  sick sinus syndrome  POST-OPERATIVE DIAGNOSIS:  Same  PROCEDURE:  ICD generator change out  SURGEON:  Rosi Secrist, MD    ANESTHESIA:     PREOPERATIVE INDICATIONS:  Patricia Woodard is a  81 y.o. female with a diagnosis of cardiomyopathy, chronic systolic congestive heart failure and sick sinus syndrome who failed conservative measures and elected for surgical management.  Recent ICD interrogation has shown that the greater was at elective replacement indication.  The risks benefits and alternatives were discussed with the patient preoperatively including but not limited to the risks of infection, bleeding, cardiopulmonary complications, the need for revision surgery, among others, and the patient was willing to proceed.   OPERATIVE PROCEDURE: She was brought to the operating room the fasting state. The left pectoral region was prepped and draped in the usual sterile manner. A 6 cm incision was performed over the old ICD site. The ICD was retrieved to call and blunt dissection. The leads were disconnected and connected to a new number pacemaker defibrillator (Medtronic DDD B1 D1, Evera XT DR ). The ICD pocket was irrigated with gentamicin solution. The new ICD generator is positioned into the pocket and pocket was closed with 20 and 4-0 Vicryl, respectively. Steri-Strips and a pressure dressing were applied.

## 2014-11-04 NOTE — H&P (Signed)
Progress Notes   Patricia Woodard (MR# [id number])        Progress Notes Info     Author Note Status Last Update User Last Update Date/Time   Patricia Dibble, MD Signed Patricia Dibble, MD Thu Oct 23, 2014 12:42 PM    Progress Notes    Expand All Collapse All   Established Patient Visit   Chief Complaint: Chief Complaint  Patient presents with  . Follow-up    recheck   . Pacer-ICD    battery eri    Date of Service: 10/16/2014 Date of Birth: Jun 03, 1932 PCP: Patricia Brunner, MD  History of Present Illness: Patricia Woodard is a 81 y.o.female patient   Pacemaker/Defib interrogation The patient has had a pacemaker/Defib interrogation which has shown that the pacemaker battery has reached end of life. We have had further discussion of battery change out with all of the risks and benefits. Otherwise the patient has not had any significant side effects or symptoms of the pacemaker wires. Coronary artery disease The patient has coronary artery disease previously diagnosed by imaging study currently on appropriate medication management including ACE inhibitors and beta-blocker for risk factor reduction including age, HTN and Hyperlipidemia. The patient appears not to have any significant side effects of treatment at this time and appears stable at this time without evidence of clinical progression of symptoms  Stage C reduced ejection fraction heart failure The patient has chronicmoderate systolic heart failure secondary to coronary artery disease (CAD) and Congestive Heart Failure has been manifested by severe exercise intolerance with a [place] Functional Class of III.Marland KitchenPatient appears euvolemic. and other symptoms including chest pain stable. Current medications and therapy includes  beta-blocker, Ace inhibitor and furosemide and or Aldactone     Past Medical and Surgical History  Past Medical History Past Medical History  Diagnosis Date  . CKD (chronic  kidney disease)   . Hyperlipidemia   . CAD (coronary artery disease)   . Dilated cardiomyopathy   . Hypertension   . PAD (peripheral artery disease)   . Hypothyroid   . Renal cell carcinoma of right kidney     suspected in the lower pole evaluated by Dr. Yves Dill on 10/05/2006  . Congestive heart failure, unspecified   . Atrial fibrillation   . Sinoatrial node dysfunction     Past Surgical History She has past surgical history that includes Appendectomy; Cholecystectomy; removal ectopic pregnancy w/evacuation; pacemaker/defibrillator (04/2008); AV shunt; and radiation right kidney (11/05/08).   Medications and Allergies  Current Medications   Current Medications    Current Outpatient Prescriptions  Medication Sig Dispense Refill  . AMIOdarone (PACERONE) 200 MG tablet Take 1 tablet (200 mg total) by mouth once daily. 90 tablet 3  . amLODIPine (NORVASC) 10 MG tablet Take 10 mg by mouth once daily.    Marland Kitchen aspirin 81 MG EC tablet Take 81 mg by mouth once daily.    Marland Kitchen b complex multivitamin (RENA-VITE) 0.8 mg Take 1 tablet by mouth once daily.    . carvedilol (COREG) 6.25 MG tablet Take 1 tablet (6.25 mg total) by mouth 2 (two) times daily. 180 tablet 3  . cinacalcet (SENSIPAR) 30 MG tablet Take 1 tablet (30 mg total) by mouth once daily. On dialysis days 90 tablet 3  . cloNIDine HCl (CATAPRES) 0.1 MG tablet TAKE ONE TABLET BY MOUTH THREE TIMES DAILY AS DIRECTED 270 tablet 1  . clopidogrel (PLAVIX) 75 mg tablet Take 75 mg by mouth once daily.    Marland Kitchen  diphenhydrAMINE (BENADRYL) 25 mg capsule Take 25 mg by mouth every 6 (six) hours as needed for Itching.    Marland Kitchen EDARBI 80 mg Tab TAKE ONE TABLET BY MOUTH ONCE DAILY 90 tablet 0  . esomeprazole (NEXIUM) 20 MG DR capsule Take 1 capsule (20 mg total) by mouth once daily. 90 capsule 3  . fentaNYL (DURAGESIC) 25 mcg/hr patch Place 1 patch onto the skin every  third day.    . FUROsemide (LASIX) 80 MG tablet Take 1 tablet (80 mg total) by mouth once daily. 90 tablet 3  . hydrALAZINE (APRESOLINE) 100 MG tablet Take 1 tablet (100 mg total) by mouth 2 (two) times daily. 180 tablet 3  . isosorbide mononitrate (IMDUR) 30 MG ER tablet Take 30 mg by mouth once daily.    Marland Kitchen levothyroxine (SYNTHROID, LEVOTHROID) 100 MCG tablet Take 100 mcg by mouth once daily. Take on an empty stomach with a glass of water at least 30-60 minutes before breakfast.    . losartan (COZAAR) 100 MG tablet TAKE ONE TABLET BY MOUTH ONCE DAILY 30 tablet 5  . lovastatin (MEVACOR) 40 MG tablet Take 1 tablet (40 mg total) by mouth once daily. 90 tablet 3  . mirtazapine (REMERON) 15 MG tablet TAKE ONE TABLET BY MOUTH AT BEDTIME 90 tablet 0  . sevelamer carbonate (RENVELA) 800 mg tablet Take 2 tablets (1,600 mg total) by mouth 3 (three) times daily with meals. 360 tablet 3   No current facility-administered medications for this visit.      Allergies: Other; Penicillins; and Shellfish containing products  Social and Family History  Social History  reports that she has been smoking. She has never used smokeless tobacco. She reports that she does not drink alcohol.  Family History No family history on file.  Review of Systems   Review of Systems  Positive for none Negative for weight gain weight loss,weakness, vision change, hearing loss, cough, congestion, PND, orthopnea, heartburn, nausea, diaphoresis, vomiting, diarrhea, bloody stool, melena, stomach pain, extremity pain, leg weakness, buttock cramping, leg blood clots, headache, blackouts, nosebleed, trouble swallowing, mouth pain, urinary frequency, urination at night, muscle weakness, skin lesions, skin rashes, tingling , numbness, anxiety, and/or depression Physical Examination   Vitals:BP 110/60 mmHg  Pulse 62  Resp 14  Ht 170.2 cm (5\' 7" )  Wt 61.236 kg (135 lb)  BMI  21.14 kg/m2  SpO2 96% Ht:170.2 cm (5\' 7" ) Wt:61.236 kg (135 lb) EHU:DJSH surface area is 1.70 meters squared. Body mass index is 21.14 kg/(m^2). Appearance: well appearing in no acute distress HEENT: Pupils equally reactive to light and accomodation, no apparent xanthalasma  Neck: Supple, no apparent thyromegaly, and or masses Lungs: normal respiratory effort; no wheezes, no rhonchi, no crackles Heart: Regular rate and rhythm. Normal S1 S2 No gallops, murmur, no rub, PMI is normal. carotid upstroke normal without bruit. Jugular venous pressure is normal Abdomen: soft, nontender, with normal bowel sounds. No apparent hepatosplenomegally. Abdominal aorta is normal size without bruit Extremities: no edema, no ulcers, no clubbing, no cyanosis Peripheral Pulses: 2+ in upper extremities, 1+ femoral pulses bilaterally, 2+lower extremity  Musculoskeletal; Normal muscle tone without kyphosis Neurological: Cranial nerves intact, oriented to time, place, person  Assessment   81 y.o. female with  Encounter Diagnoses  Name Primary?  . Dilated cardiomyopathy Yes  . Coronary artery disease involving native coronary artery of native heart with other form of angina pectoris         Plan  -the patient will have a consultation  and or surgical treatment for pacemaker/defib battery end of life with battery change out. Patient understands all risks and benefits of battery change out. This includes the possibility of death, stroke, heart attack, infection, bleeding, blood clot, and or side effects of medication management for anesthesia and agrees to proceed -No further medical intervention of congestive heart failure which appears to be clinically stable at this time. The patient has been on appropriate medication management as well as using diet and exercise to improve quality of life. We have discussed moving forward that the patient would continue these measures. The patient  understands to call if any new significant symptoms occurred to reduce hospitalization. -the patient understands all risks of future cardiovascular disease process based on discussion today. We will continue all risk factor modification and prevention including lipid management, exercise and diet, blood pressure control, and anti-platelet medication management as tolerated.    No orders of the defined types were placed in this encounter.   No Follow-up on file.  Patricia Woodard, [facility]   [address] [place] [address]    Service Location    Name Address       [place] [address]      Department    Name Address Phone Fax   [facility] [place] [address] [phone]

## 2014-11-04 NOTE — Anesthesia Preprocedure Evaluation (Addendum)
Anesthesia Evaluation  Patient identified by MRN, date of birth, ID band  Reviewed: Allergy & Precautions, NPO status   History of Anesthesia Complications Negative for: history of anesthetic complications  Airway Mallampati: II       Dental  (+) Edentulous Upper, Partial Upper   Pulmonary former smoker,    + decreased breath sounds      Cardiovascular hypertension, + Past MI + dysrhythmias Rhythm:Irregular     Neuro/Psych Seizures -,  CVA negative psych ROS   GI/Hepatic negative GI ROS, Neg liver ROS,   Endo/Other    Renal/GU CRFRenal disease  negative genitourinary   Musculoskeletal negative musculoskeletal ROS (+)   Abdominal Normal abdominal exam  (+)   Peds negative pediatric ROS (+)  Hematology negative hematology ROS (+)   Anesthesia Other Findings   Reproductive/Obstetrics                            Anesthesia Physical Anesthesia Plan  ASA: III  Anesthesia Plan: MAC   Post-op Pain Management:    Induction: Intravenous  Airway Management Planned: Natural Airway  Additional Equipment:   Intra-op Plan:   Post-operative Plan:   Informed Consent: I have reviewed the patients History and Physical, chart, labs and discussed the procedure including the risks, benefits and alternatives for the proposed anesthesia with the patient or authorized representative who has indicated his/her understanding and acceptance.     Plan Discussed with: CRNA and Surgeon  Anesthesia Plan Comments:        Anesthesia Quick Evaluation

## 2014-11-04 NOTE — Transfer of Care (Signed)
Immediate Anesthesia Transfer of Care Note  Patient: Patricia Woodard  Procedure(s) Performed: Procedure(s): ICD LEAD REMOVAL (N/A)  Patient Location: PACU  Anesthesia Type:General  Level of Consciousness: awake, alert , oriented and patient cooperative  Airway & Oxygen Therapy: Patient Spontanous Breathing and Patient connected to face mask oxygen  Post-op Assessment: Report given to RN and Post -op Vital signs reviewed and stable  Post vital signs: Reviewed and stable  Last Vitals:  Filed Vitals:   11/04/14 1334  BP: 123/52  Pulse: 61  Temp: 36.1 C  Resp: 18    Complications: No apparent anesthesia complications

## 2014-11-05 ENCOUNTER — Encounter: Payer: Self-pay | Admitting: Cardiology

## 2014-11-05 NOTE — Anesthesia Postprocedure Evaluation (Signed)
  Anesthesia Post-op Note  Patient: Patricia Woodard  Procedure(s) Performed: Procedure(s): ICD LEAD REMOVAL (N/A)  Anesthesia type:MAC, General  Patient location: PACU  Post pain: Pain level controlled  Post assessment: Post-op Vital signs reviewed, Patient's Cardiovascular Status Stable, Respiratory Function Stable, Patent Airway and No signs of Nausea or vomiting  Post vital signs: Reviewed and stable  Last Vitals:  Filed Vitals:   11/04/14 1440  BP: 133/55  Pulse: 60  Temp: 36.2 C  Resp: 18    Level of consciousness: awake, alert  and patient cooperative  Complications: No apparent anesthesia complications

## 2014-11-07 ENCOUNTER — Telehealth: Payer: Self-pay | Admitting: *Deleted

## 2014-11-07 NOTE — Telephone Encounter (Signed)
I talked with the patients's daughter [name] today to f/u on her surgical consultation.  Patient states her mom is being referred to [facility] for surgery by Dr. Tamala Julian.  She states the pacemaker and dialysis fistula are in the surgical site, and her prefers to refer to [facility].  Asked if she could let me know when her moms surgery is so when can schedule a return visit with Dr. Oliva Bustard.  She is agreeable.

## 2014-11-10 ENCOUNTER — Other Ambulatory Visit: Payer: Self-pay | Admitting: Cardiovascular Disease

## 2016-04-21 ENCOUNTER — Ambulatory Visit
Admission: RE | Admit: 2016-04-21 | Discharge: 2016-04-21 | Disposition: A | Discharge status: Home / Self Care | Payer: Medicare Other | Source: Ambulatory Visit | Attending: Internal Medicine | Admitting: Internal Medicine

## 2016-04-21 ENCOUNTER — Other Ambulatory Visit: Payer: Self-pay | Admitting: Internal Medicine

## 2016-04-21 DIAGNOSIS — R059 Cough, unspecified: Secondary | ICD-10-CM

## 2016-04-21 DIAGNOSIS — J439 Emphysema, unspecified: Secondary | ICD-10-CM | POA: Insufficient documentation

## 2016-04-21 DIAGNOSIS — Z95810 Presence of automatic (implantable) cardiac defibrillator: Secondary | ICD-10-CM | POA: Diagnosis not present

## 2016-04-21 DIAGNOSIS — I700 Atherosclerosis of aorta: Secondary | ICD-10-CM | POA: Insufficient documentation

## 2016-04-21 DIAGNOSIS — R05 Cough: Secondary | ICD-10-CM

## 2016-04-21 DIAGNOSIS — Z992 Dependence on renal dialysis: Secondary | ICD-10-CM | POA: Diagnosis not present

## 2016-04-21 DIAGNOSIS — J9811 Atelectasis: Secondary | ICD-10-CM | POA: Insufficient documentation

## 2016-05-13 ENCOUNTER — Emergency Department: Payer: Medicare Other

## 2016-05-13 ENCOUNTER — Inpatient Hospital Stay
Admission: EM | Admit: 2016-05-13 | Discharge: 2016-05-17 | DRG: 480 | Disposition: A | Discharge status: Skilled Nursing Facility | Payer: Medicare Other | Attending: Internal Medicine | Admitting: Internal Medicine

## 2016-05-13 DIAGNOSIS — I482 Chronic atrial fibrillation, unspecified: Secondary | ICD-10-CM

## 2016-05-13 DIAGNOSIS — E871 Hypo-osmolality and hyponatremia: Secondary | ICD-10-CM | POA: Diagnosis present

## 2016-05-13 DIAGNOSIS — E876 Hypokalemia: Secondary | ICD-10-CM | POA: Diagnosis present

## 2016-05-13 DIAGNOSIS — I959 Hypotension, unspecified: Secondary | ICD-10-CM | POA: Diagnosis not present

## 2016-05-13 DIAGNOSIS — Z7982 Long term (current) use of aspirin: Secondary | ICD-10-CM

## 2016-05-13 DIAGNOSIS — Z8781 Personal history of (healed) traumatic fracture: Secondary | ICD-10-CM

## 2016-05-13 DIAGNOSIS — Z95810 Presence of automatic (implantable) cardiac defibrillator: Secondary | ICD-10-CM | POA: Diagnosis not present

## 2016-05-13 DIAGNOSIS — I420 Dilated cardiomyopathy: Secondary | ICD-10-CM | POA: Diagnosis present

## 2016-05-13 DIAGNOSIS — Z79811 Long term (current) use of aromatase inhibitors: Secondary | ICD-10-CM

## 2016-05-13 DIAGNOSIS — E785 Hyperlipidemia, unspecified: Secondary | ICD-10-CM | POA: Diagnosis present

## 2016-05-13 DIAGNOSIS — Z91041 Radiographic dye allergy status: Secondary | ICD-10-CM

## 2016-05-13 DIAGNOSIS — W010XXA Fall on same level from slipping, tripping and stumbling without subsequent striking against object, initial encounter: Secondary | ICD-10-CM | POA: Diagnosis present

## 2016-05-13 DIAGNOSIS — I739 Peripheral vascular disease, unspecified: Secondary | ICD-10-CM | POA: Diagnosis present

## 2016-05-13 DIAGNOSIS — S72141A Displaced intertrochanteric fracture of right femur, initial encounter for closed fracture: Secondary | ICD-10-CM | POA: Diagnosis present

## 2016-05-13 DIAGNOSIS — Z992 Dependence on renal dialysis: Secondary | ICD-10-CM | POA: Diagnosis not present

## 2016-05-13 DIAGNOSIS — J95821 Acute postprocedural respiratory failure: Secondary | ICD-10-CM | POA: Diagnosis not present

## 2016-05-13 DIAGNOSIS — R911 Solitary pulmonary nodule: Secondary | ICD-10-CM

## 2016-05-13 DIAGNOSIS — Z9861 Coronary angioplasty status: Secondary | ICD-10-CM

## 2016-05-13 DIAGNOSIS — Z85528 Personal history of other malignant neoplasm of kidney: Secondary | ICD-10-CM

## 2016-05-13 DIAGNOSIS — R918 Other nonspecific abnormal finding of lung field: Secondary | ICD-10-CM

## 2016-05-13 DIAGNOSIS — J9600 Acute respiratory failure, unspecified whether with hypoxia or hypercapnia: Secondary | ICD-10-CM | POA: Diagnosis present

## 2016-05-13 DIAGNOSIS — Z91013 Allergy to seafood: Secondary | ICD-10-CM

## 2016-05-13 DIAGNOSIS — I272 Pulmonary hypertension, unspecified: Secondary | ICD-10-CM | POA: Diagnosis present

## 2016-05-13 DIAGNOSIS — J449 Chronic obstructive pulmonary disease, unspecified: Secondary | ICD-10-CM | POA: Diagnosis present

## 2016-05-13 DIAGNOSIS — C50919 Malignant neoplasm of unspecified site of unspecified female breast: Secondary | ICD-10-CM | POA: Diagnosis present

## 2016-05-13 DIAGNOSIS — I5032 Chronic diastolic (congestive) heart failure: Secondary | ICD-10-CM | POA: Diagnosis present

## 2016-05-13 DIAGNOSIS — R262 Difficulty in walking, not elsewhere classified: Secondary | ICD-10-CM

## 2016-05-13 DIAGNOSIS — Y92012 Bathroom of single-family (private) house as the place of occurrence of the external cause: Secondary | ICD-10-CM

## 2016-05-13 DIAGNOSIS — I252 Old myocardial infarction: Secondary | ICD-10-CM | POA: Diagnosis not present

## 2016-05-13 DIAGNOSIS — Z7902 Long term (current) use of antithrombotics/antiplatelets: Secondary | ICD-10-CM | POA: Diagnosis not present

## 2016-05-13 DIAGNOSIS — I132 Hypertensive heart and chronic kidney disease with heart failure and with stage 5 chronic kidney disease, or end stage renal disease: Secondary | ICD-10-CM | POA: Diagnosis present

## 2016-05-13 DIAGNOSIS — Z9889 Other specified postprocedural states: Secondary | ICD-10-CM

## 2016-05-13 DIAGNOSIS — I2510 Atherosclerotic heart disease of native coronary artery without angina pectoris: Secondary | ICD-10-CM | POA: Diagnosis present

## 2016-05-13 DIAGNOSIS — S72001A Fracture of unspecified part of neck of right femur, initial encounter for closed fracture: Secondary | ICD-10-CM | POA: Diagnosis not present

## 2016-05-13 DIAGNOSIS — Z880 Allergy status to penicillin: Secondary | ICD-10-CM

## 2016-05-13 DIAGNOSIS — N186 End stage renal disease: Secondary | ICD-10-CM | POA: Diagnosis present

## 2016-05-13 DIAGNOSIS — M6281 Muscle weakness (generalized): Secondary | ICD-10-CM

## 2016-05-13 DIAGNOSIS — I10 Essential (primary) hypertension: Secondary | ICD-10-CM

## 2016-05-13 DIAGNOSIS — Z87891 Personal history of nicotine dependence: Secondary | ICD-10-CM

## 2016-05-13 DIAGNOSIS — I9581 Postprocedural hypotension: Secondary | ICD-10-CM | POA: Diagnosis not present

## 2016-05-13 DIAGNOSIS — M25551 Pain in right hip: Secondary | ICD-10-CM | POA: Diagnosis present

## 2016-05-13 DIAGNOSIS — M79604 Pain in right leg: Secondary | ICD-10-CM

## 2016-05-13 DIAGNOSIS — Z923 Personal history of irradiation: Secondary | ICD-10-CM

## 2016-05-13 DIAGNOSIS — J9601 Acute respiratory failure with hypoxia: Secondary | ICD-10-CM | POA: Diagnosis not present

## 2016-05-13 DIAGNOSIS — E8770 Fluid overload, unspecified: Secondary | ICD-10-CM | POA: Diagnosis not present

## 2016-05-13 LAB — APTT: APTT: 34 s (ref 24–36)

## 2016-05-13 LAB — CBC WITH DIFFERENTIAL/PLATELET
BASOS ABS: 0.1 10*3/uL (ref 0–0.1)
BASOS PCT: 1 %
Basophils Absolute: 0.1 10*3/uL (ref 0–0.1)
Basophils Relative: 1 %
EOS PCT: 1 %
Eosinophils Absolute: 0.3 10*3/uL (ref 0–0.7)
Eosinophils Absolute: 0.1 10*3/uL (ref 0–0.7)
Eosinophils Relative: 4 %
HCT: 40.5 % (ref 35.0–47.0)
HEMATOCRIT: 41.4 % (ref 35.0–47.0)
HEMOGLOBIN: 14 g/dL (ref 12.0–16.0)
Hemoglobin: 13.5 g/dL (ref 12.0–16.0)
LYMPHS ABS: 0.7 10*3/uL — AB (ref 1.0–3.6)
LYMPHS PCT: 10 %
Lymphocytes Relative: 21 %
Lymphs Abs: 1.5 10*3/uL (ref 1.0–3.6)
MCH: 34.9 pg — ABNORMAL HIGH (ref 26.0–34.0)
MCH: 34.4 pg — AB (ref 26.0–34.0)
MCHC: 33.7 g/dL (ref 32.0–36.0)
MCHC: 33.4 g/dL (ref 32.0–36.0)
MCV: 103.3 fL — ABNORMAL HIGH (ref 80.0–100.0)
MCV: 103.1 fL — AB (ref 80.0–100.0)
MONO ABS: 0.7 10*3/uL (ref 0.2–0.9)
MONOS PCT: 9 %
MONOS PCT: 10 %
Monocytes Absolute: 0.7 10*3/uL (ref 0.2–0.9)
NEUTROS ABS: 4.6 10*3/uL (ref 1.4–6.5)
NEUTROS PCT: 65 %
Neutro Abs: 5.6 10*3/uL (ref 1.4–6.5)
Neutrophils Relative %: 78 %
PLATELETS: 104 10*3/uL — AB (ref 150–440)
Platelets: 126 10*3/uL — ABNORMAL LOW (ref 150–440)
RBC: 4.01 MIL/uL (ref 3.80–5.20)
RBC: 3.92 MIL/uL (ref 3.80–5.20)
RDW: 16.3 % — ABNORMAL HIGH (ref 11.5–14.5)
RDW: 16.1 % — AB (ref 11.5–14.5)
WBC: 7.1 10*3/uL (ref 3.6–11.0)
WBC: 7.1 10*3/uL (ref 3.6–11.0)

## 2016-05-13 LAB — TYPE AND SCREEN
ABO/RH(D): O POS
Antibody Screen: NEGATIVE

## 2016-05-13 LAB — COMPREHENSIVE METABOLIC PANEL
ALT: 35 U/L (ref 14–54)
AST: 106 U/L — ABNORMAL HIGH (ref 15–41)
Albumin: 3.3 g/dL — ABNORMAL LOW (ref 3.5–5.0)
Alkaline Phosphatase: 211 U/L — ABNORMAL HIGH (ref 38–126)
Anion gap: 13 (ref 5–15)
BUN: 34 mg/dL — ABNORMAL HIGH (ref 6–20)
CHLORIDE: 92 mmol/L — AB (ref 101–111)
CO2: 26 mmol/L (ref 22–32)
CREATININE: 5.27 mg/dL — AB (ref 0.44–1.00)
Calcium: 8.6 mg/dL — ABNORMAL LOW (ref 8.9–10.3)
GFR, EST AFRICAN AMERICAN: 8 mL/min — AB (ref >60)
GFR, EST NON AFRICAN AMERICAN: 7 mL/min — AB (ref >60)
Glucose, Bld: 130 mg/dL — ABNORMAL HIGH (ref 65–99)
Potassium: 3.6 mmol/L (ref 3.5–5.1)
Sodium: 131 mmol/L — ABNORMAL LOW (ref 135–145)
Total Bilirubin: 1.2 mg/dL (ref 0.3–1.2)
Total Protein: 7.9 g/dL (ref 6.5–8.1)

## 2016-05-13 LAB — PROTIME-INR
INR: 1.1
Prothrombin Time: 14.2 seconds (ref 11.4–15.2)

## 2016-05-13 LAB — TROPONIN I

## 2016-05-13 MED ORDER — SODIUM CHLORIDE 0.9 % IV SOLN: 1000.0000 mL | Freq: Once | INTRAVENOUS | Status: DC

## 2016-05-13 MED ORDER — CARVEDILOL 6.25 MG PO TABS
6.2500 mg | ORAL_TABLET | Freq: Two times a day (BID) | ORAL | Status: DC
  Administered 2016-05-13 – 2016-05-14 (×2): 6.25 mg via ORAL
  Filled 2016-05-13 (×3): qty 1

## 2016-05-13 MED ORDER — ACETAMINOPHEN 650 MG RE SUPP: 650.0000 mg | Freq: Four times a day (QID) | RECTAL | Status: DC | PRN

## 2016-05-13 MED ORDER — CEFAZOLIN SODIUM-DEXTROSE 2-4 GM/100ML-% IV SOLN
2.0000 g | Freq: Once | INTRAVENOUS | Status: DC
  Filled 2016-05-13: qty 100

## 2016-05-13 MED ORDER — POTASSIUM CHLORIDE CRYS ER 20 MEQ PO TBCR
40.0000 meq | EXTENDED_RELEASE_TABLET | Freq: Two times a day (BID) | ORAL | Status: DC
  Administered 2016-05-13: 40 meq via ORAL
  Filled 2016-05-13: qty 2

## 2016-05-13 MED ORDER — PRAVASTATIN SODIUM 40 MG PO TABS
40.0000 mg | ORAL_TABLET | Freq: Every day | ORAL | Status: DC
  Administered 2016-05-13: 40 mg via ORAL
  Filled 2016-05-13 (×2): qty 1

## 2016-05-13 MED ORDER — AMIODARONE HCL 200 MG PO TABS
200.0000 mg | ORAL_TABLET | Freq: Every day | ORAL | Status: DC
  Administered 2016-05-13 – 2016-05-17 (×3): 200 mg via ORAL
  Filled 2016-05-13 (×3): qty 1

## 2016-05-13 MED ORDER — ONDANSETRON HCL 4 MG/2ML IJ SOLN
INTRAMUSCULAR | Status: AC
  Filled 2016-05-13: qty 2

## 2016-05-13 MED ORDER — SODIUM CHLORIDE 0.9 % IV SOLN
INTRAVENOUS | Status: DC
  Administered 2016-05-13: 19:00:00 via INTRAVENOUS

## 2016-05-13 MED ORDER — CLARITHROMYCIN 500 MG PO TABS
250.0000 mg | ORAL_TABLET | Freq: Two times a day (BID) | ORAL | Status: DC
  Administered 2016-05-13: 250 mg via ORAL
  Filled 2016-05-13 (×2): qty 0.5
  Filled 2016-05-13: qty 1

## 2016-05-13 MED ORDER — ONDANSETRON HCL 4 MG PO TABS: 4.0000 mg | ORAL_TABLET | Freq: Four times a day (QID) | ORAL | Status: DC | PRN

## 2016-05-13 MED ORDER — MORPHINE SULFATE (PF) 4 MG/ML IV SOLN
4.0000 mg | Freq: Once | INTRAVENOUS | Status: AC
  Administered 2016-05-13: 4 mg via INTRAVENOUS

## 2016-05-13 MED ORDER — SEVELAMER CARBONATE 800 MG PO TABS: 800.0000 mg | ORAL_TABLET | Freq: Three times a day (TID) | ORAL | Status: DC

## 2016-05-13 MED ORDER — PANTOPRAZOLE SODIUM 40 MG PO TBEC
40.0000 mg | DELAYED_RELEASE_TABLET | Freq: Every day | ORAL | Status: DC
  Administered 2016-05-13 – 2016-05-16 (×2): 40 mg via ORAL
  Filled 2016-05-13 (×2): qty 1

## 2016-05-13 MED ORDER — ACETAMINOPHEN 325 MG PO TABS
650.0000 mg | ORAL_TABLET | Freq: Four times a day (QID) | ORAL | Status: DC | PRN
  Administered 2016-05-17: 650 mg via ORAL
  Filled 2016-05-13: qty 2

## 2016-05-13 MED ORDER — ONDANSETRON HCL 4 MG/2ML IJ SOLN
4.0000 mg | Freq: Once | INTRAMUSCULAR | Status: AC
  Administered 2016-05-13: 4 mg via INTRAVENOUS

## 2016-05-13 MED ORDER — ONDANSETRON HCL 4 MG/2ML IJ SOLN
4.0000 mg | Freq: Four times a day (QID) | INTRAMUSCULAR | Status: DC | PRN
  Administered 2016-05-14: 4 mg via INTRAVENOUS
  Filled 2016-05-13: qty 2

## 2016-05-13 MED ORDER — RENA-VITE PO TABS
1.0000 | ORAL_TABLET | Freq: Every day | ORAL | Status: DC
  Administered 2016-05-13 – 2016-05-17 (×3): 1 via ORAL
  Filled 2016-05-13 (×3): qty 1

## 2016-05-13 MED ORDER — SENNA 8.6 MG PO TABS
1.0000 | ORAL_TABLET | Freq: Two times a day (BID) | ORAL | Status: DC
  Administered 2016-05-13 – 2016-05-17 (×4): 8.6 mg via ORAL
  Filled 2016-05-13 (×4): qty 1

## 2016-05-13 MED ORDER — ALBUTEROL SULFATE (2.5 MG/3ML) 0.083% IN NEBU: 2.5000 mg | INHALATION_SOLUTION | RESPIRATORY_TRACT | Status: DC | PRN

## 2016-05-13 MED ORDER — AMLODIPINE BESYLATE 10 MG PO TABS
10.0000 mg | ORAL_TABLET | Freq: Every day | ORAL | Status: DC
  Administered 2016-05-13: 10 mg via ORAL
  Filled 2016-05-13: qty 2

## 2016-05-13 MED ORDER — ZOLPIDEM TARTRATE 5 MG PO TABS: 5.0000 mg | ORAL_TABLET | Freq: Every evening | ORAL | Status: DC | PRN

## 2016-05-13 MED ORDER — METHOCARBAMOL 500 MG PO TABS: 500.0000 mg | ORAL_TABLET | Freq: Four times a day (QID) | ORAL | Status: DC | PRN

## 2016-05-13 MED ORDER — ISOSORBIDE MONONITRATE ER 30 MG PO TB24
30.0000 mg | ORAL_TABLET | Freq: Every day | ORAL | Status: DC
  Administered 2016-05-13: 30 mg via ORAL
  Filled 2016-05-13: qty 1

## 2016-05-13 MED ORDER — HYDROCODONE-ACETAMINOPHEN 5-325 MG PO TABS
1.0000 | ORAL_TABLET | Freq: Four times a day (QID) | ORAL | Status: DC | PRN
  Administered 2016-05-13: 1 via ORAL
  Administered 2016-05-14: 2 via ORAL
  Administered 2016-05-16: 1 via ORAL
  Filled 2016-05-13: qty 2
  Filled 2016-05-13 (×2): qty 1

## 2016-05-13 MED ORDER — MORPHINE SULFATE (PF) 4 MG/ML IV SOLN
INTRAVENOUS | Status: AC
  Filled 2016-05-13: qty 1

## 2016-05-13 MED ORDER — SODIUM CHLORIDE 0.9 % IV SOLN: INTRAVENOUS | Status: DC

## 2016-05-13 MED ORDER — MORPHINE SULFATE (PF) 4 MG/ML IV SOLN
1.0000 mg | INTRAVENOUS | Status: DC | PRN
  Administered 2016-05-13 – 2016-05-14 (×5): 1 mg via INTRAVENOUS
  Filled 2016-05-13 (×5): qty 1

## 2016-05-13 MED ORDER — CLINDAMYCIN PHOSPHATE 600 MG/50ML IV SOLN
600.0000 mg | Freq: Once | INTRAVENOUS | Status: AC
  Administered 2016-05-14: 600 mg via INTRAVENOUS
  Filled 2016-05-13: qty 50

## 2016-05-13 MED ORDER — HYDRALAZINE HCL 50 MG PO TABS
100.0000 mg | ORAL_TABLET | Freq: Three times a day (TID) | ORAL | Status: DC
  Administered 2016-05-13: 100 mg via ORAL
  Filled 2016-05-13: qty 2

## 2016-05-13 MED ORDER — CLONIDINE HCL 0.1 MG PO TABS
0.1000 mg | ORAL_TABLET | Freq: Three times a day (TID) | ORAL | Status: DC
  Administered 2016-05-13: 0.1 mg via ORAL
  Filled 2016-05-13: qty 1

## 2016-05-13 MED ORDER — LOSARTAN POTASSIUM 25 MG PO TABS
50.0000 mg | ORAL_TABLET | Freq: Every day | ORAL | Status: DC
  Administered 2016-05-13: 50 mg via ORAL
  Filled 2016-05-13: qty 1

## 2016-05-13 MED ORDER — FUROSEMIDE 80 MG PO TABS
80.0000 mg | ORAL_TABLET | Freq: Every day | ORAL | Status: DC
  Administered 2016-05-13: 80 mg via ORAL
  Filled 2016-05-13: qty 2

## 2016-05-13 MED ORDER — CINACALCET HCL 30 MG PO TABS: 30.0000 mg | ORAL_TABLET | Freq: Every day | ORAL | Status: DC

## 2016-05-13 MED ORDER — LEVOTHYROXINE SODIUM 100 MCG PO TABS
100.0000 ug | ORAL_TABLET | Freq: Every day | ORAL | Status: DC
  Administered 2016-05-13 – 2016-05-16 (×2): 100 ug via ORAL
  Filled 2016-05-13 (×2): qty 2

## 2016-05-13 MED ORDER — DIPHENHYDRAMINE HCL 25 MG PO TABS
25.0000 mg | ORAL_TABLET | Freq: Four times a day (QID) | ORAL | Status: DC | PRN
  Filled 2016-05-13: qty 1

## 2016-05-13 MED ORDER — METHOCARBAMOL 1000 MG/10ML IJ SOLN
500.0000 mg | Freq: Four times a day (QID) | INTRAVENOUS | Status: DC | PRN
  Filled 2016-05-13: qty 5

## 2016-05-13 MED ORDER — ANASTROZOLE 1 MG PO TABS
1.0000 mg | ORAL_TABLET | Freq: Every day | ORAL | Status: DC
  Administered 2016-05-13 – 2016-05-17 (×3): 1 mg via ORAL
  Filled 2016-05-13 (×3): qty 1

## 2016-05-13 MED ORDER — MIRTAZAPINE 15 MG PO TABS
15.0000 mg | ORAL_TABLET | Freq: Every day | ORAL | Status: DC
  Administered 2016-05-13 – 2016-05-16 (×2): 15 mg via ORAL
  Filled 2016-05-13 (×2): qty 1

## 2016-05-13 NOTE — H&P (Signed)
[facility] NAME: Patricia Woodard    MR#:  [id number]  DATE OF BIRTH:  05/12/1932  DATE OF ADMISSION:  05/13/2016  PRIMARY CARE PHYSICIAN: Madelyn Brunner, MD   REQUESTING/REFERRING PHYSICIAN: Nena Polio, MD  CHIEF COMPLAINT:   Chief Complaint  Patient presents with  . Fall  . Hip Pain    HISTORY OF PRESENT ILLNESS:  Patricia Woodard  is a 82 y.o. female with a known history of Arrhythmia, status post pacemaker placement, CAD, ESRD on hemodialysis, breast cancer and hypertension. And fell back stent at home today. She injured her right hip and has hip pain. She denies any dizziness, weakness or loss of consciousness. X-ray show right hip closed fracture. Dr. Sabra Heck plan to do surgery tomorrow.  PAST MEDICAL HISTORY:   Past Medical History:  Diagnosis Date  . Arrhythmia   . Cancer ([place])    breast ca - left, kidney  . Dialysis AV fistula infection ([place])    Left arm Mon, Wed, Fri dialysis  . Hypertension   . MI, old   . Renal insufficiency    dialysis    PAST SURGICAL HISTORY:   Past Surgical History:  Procedure Laterality Date  . APPENDECTOMY    . AV FISTULA PLACEMENT Left   . CHOLECYSTECTOMY    . HIP FRACTURE SURGERY Left   . ICD LEAD REMOVAL N/A 11/04/2014   Procedure: ICD LEAD REMOVAL;  Surgeon: Isaias Cowman, MD;  Location: [facility] ORS;  Service: Cardiovascular;  Laterality: N/A;  . INSERT / REPLACE / REMOVE PACEMAKER    . kidney tumor Right     SOCIAL HISTORY:   Social History  Substance Use Topics  . Smoking status: Former Smoker    Quit date: 10/17/2014  . Smokeless tobacco: Current User  . Alcohol use No    FAMILY HISTORY:  No family history on file.  DRUG ALLERGIES:   Allergies  Allergen Reactions  . Penicillins Itching  . Ivp Dye [Iodinated Diagnostic Agents] Rash  . Shellfish Allergy Rash    REVIEW OF SYSTEMS:   Review of Systems  Constitutional: Negative for chills and fever.    HENT: Negative for congestion.   Eyes: Negative for blurred vision and double vision.  Respiratory: Negative for cough, shortness of breath and stridor.   Cardiovascular: Negative for chest pain and leg swelling.  Gastrointestinal: Negative for abdominal pain, blood in stool, diarrhea, melena, nausea and vomiting.  Genitourinary: Negative for dysuria and hematuria.  Musculoskeletal: Positive for joint pain.  Skin: Negative for rash.  Neurological: Negative for dizziness, focal weakness and loss of consciousness.  Psychiatric/Behavioral: Negative for depression. The patient is not nervous/anxious.     MEDICATIONS AT HOME:   Prior to Admission medications   Medication Sig Start Date End Date Taking? Authorizing Provider  anastrozole (ARIMIDEX) 1 MG tablet Take 1 mg by mouth daily.   Yes Historical Provider, MD  aspirin EC 81 MG tablet Take 81 mg by mouth daily.   Yes Historical Provider, MD  Azilsartan Medoxomil 80 MG TABS Take 1 tablet by mouth daily.   Yes Historical Provider, MD  carvedilol (COREG) 6.25 MG tablet TAKE ONE TABLET BY MOUTH TWICE DAILY 07/13/11  Yes Jackolyn Confer, MD  clopidogrel (PLAVIX) 75 MG tablet Take 75 mg by mouth daily.   Yes Historical Provider, MD  esomeprazole (NEXIUM) 20 MG capsule Take 20 mg by mouth daily at 12 noon.   Yes Historical  Provider, MD  furosemide (LASIX) 80 MG tablet TAKE ONE TABLET BY MOUTH EVERY DAY 12/04/11  Yes Jackolyn Confer, MD  hydrALAZINE (APRESOLINE) 100 MG tablet TAKE ONE TABLET BY MOUTH THREE TIMES DAILY Patient taking differently: TAKE ONE TABLET BY MOUTH TWO TIMES DAILY 07/17/12  Yes Jackolyn Confer, MD  isosorbide mononitrate (IMDUR) 30 MG 24 hr tablet Take 30 mg by mouth daily.   Yes Historical Provider, MD  levothyroxine (SYNTHROID, LEVOTHROID) 100 MCG tablet Take 100 mcg by mouth daily before breakfast.   Yes Historical Provider, MD  lovastatin (MEVACOR) 40 MG tablet TAKE ONE TABLET BY MOUTH AT BEDTIME 05/29/11  Yes Jackolyn Confer, MD  mirtazapine (REMERON) 15 MG tablet Take 15 mg by mouth at bedtime.   Yes Historical Provider, MD  multivitamin (RENA-VIT) TABS tablet Take 1 tablet by mouth daily. 0.8mg    Yes Historical Provider, MD  PACERONE 200 MG tablet TAKE ONE TABLET BY MOUTH EVERY DAY 05/29/11  Yes Jackolyn Confer, MD  SENSIPAR 30 MG tablet TAKE ONE TABLET BY MOUTH AT DINNER. Patient taking differently: TAKE ONE TABLET BY MOUTH ON DIALYSIS DAYS. 02/27/11  Yes Jackolyn Confer, MD  sevelamer carbonate (RENVELA) 800 MG tablet Take 800 mg by mouth 3 (three) times daily with meals.   Yes Historical Provider, MD  diphenhydrAMINE (BENADRYL) 25 MG tablet Take 25 mg by mouth every 6 (six) hours as needed.    Historical Provider, MD      VITAL SIGNS:  Blood pressure (!) 153/64, pulse (!) 59, resp. rate 20, height 5\' 7"  (1.702 m), weight 120 lb (54.4 kg), SpO2 98 %.  PHYSICAL EXAMINATION:  Physical Exam  GENERAL:  82 y.o.-year-old patient lying in the bed with no acute distress.  EYES: Pupils equal, round, reactive to light and accommodation. No scleral icterus. Extraocular muscles intact.  HEENT: Head atraumatic, normocephalic. Oropharynx and nasopharynx clear.  NECK:  Supple, no jugular venous distention. No thyroid enlargement, no tenderness.  LUNGS: Normal breath sounds bilaterally, no wheezing, rales,rhonchi or crepitation. No use of accessory muscles of respiration.  CARDIOVASCULAR: S1, S2 normal. No murmurs, rubs, or gallops.  ABDOMEN: Soft, nontender, nondistended. Bowel sounds present. No organomegaly or mass.  EXTREMITIES: No pedal edema, cyanosis, or clubbing.  NEUROLOGIC: Cranial nerves II through XII are intact. Muscle strength 54/5 in all extremities except right leg in left rotation abnormality. Sensation intact. Gait not checked.  PSYCHIATRIC: The patient is alert and oriented x 2.  SKIN: No obvious rash, lesion, or ulcer.   LABORATORY PANEL:   CBC  Recent Labs Lab 05/13/16 1736  WBC 7.1   HGB 14.0  HCT 41.4  PLT 126*   ------------------------------------------------------------------------------------------------------------------  Chemistries   Recent Labs Lab 05/13/16 1736  NA 130*  K 3.3*  CL 91*  CO2 25  GLUCOSE 122*  BUN 31*  CREATININE 5.01*  CALCIUM 8.6*  AST 37  ALT 19  ALKPHOS 183*  BILITOT <0.1*   ------------------------------------------------------------------------------------------------------------------  Cardiac Enzymes  Recent Labs Lab 05/13/16 1736  TROPONINI <0.03   ------------------------------------------------------------------------------------------------------------------  RADIOLOGY:  Ct Head Wo Contrast  Result Date: 05/13/2016 CLINICAL DATA:  Fall with head and right hip injury 2-3 hours prior. EXAM: CT HEAD WITHOUT CONTRAST CT CERVICAL SPINE WITHOUT CONTRAST TECHNIQUE: Multidetector CT imaging of the head and cervical spine was performed following the standard protocol without intravenous contrast. Multiplanar CT image reconstructions of the cervical spine were also generated. COMPARISON:  12/01/2012 head and cervical spine CT. FINDINGS: CT HEAD FINDINGS  Brain: No evidence of parenchymal hemorrhage or extra-axial fluid collection. No mass lesion, mass effect, or midline shift. No CT evidence of acute infarction. Intracranial atherosclerosis. Nonspecific moderate subcortical and periventricular white matter hypodensity, most in keeping with chronic small vessel ischemic change. No ventriculomegaly. Vascular: No hyperdense vessel or unexpected calcification. Skull: No evidence of calvarial fracture. Sinuses/Orbits: The visualized paranasal sinuses are essentially clear. Other:  The mastoid air cells are unopacified. CT CERVICAL SPINE FINDINGS Alignment: Reversal of the normal cervical lordosis. No subluxation. Dens is well positioned between the lateral masses of C1. Skull base and vertebrae: No acute fracture. No primary bone  lesion or focal pathologic process. Soft tissues and spinal canal: No prevertebral fluid or swelling. No visible canal hematoma. Disc levels: Mild-to-moderate spondylosis in the lower cervical spine, most prominent at C6-7, not appreciably changed. Mild bilateral facet arthropathy. No significant bony foraminal stenosis. Upper chest: Partially visualized irregular apical left upper lobe pulmonary nodule measuring at least 2.1 cm (series 6/ image 75), not appreciated on 10/23/2014 PET-CT study. Apical 0.7 cm right upper lobe pulmonary nodule (series 6/ image 67), not definitely seen on 10/23/2014 PET-CT. Centrilobular emphysema at the lung apices. Partially visualized left subclavian pacer leads. Other: Visualized mastoid air cells appear clear. No discrete thyroid nodules. No pathologically enlarged cervical nodes. Extracranial right carotid artery stent. IMPRESSION: 1. No evidence of acute intracranial abnormality. No evidence of calvarial fracture. 2. Moderate chronic small vessel ischemia. 3. No cervical spine fracture or subluxation. 4. Mild degenerative changes in the cervical spine as described . 5. **An incidental finding of potential clinical significance has been found. Partially visualized irregular apical left upper lobe pulmonary nodule measuring at least 2.1 cm. Apical 0.7 cm right upper lobe pulmonary nodule. These nodules were not definitely seen on 10/23/2014 PET-CT study, and primary bronchogenic carcinoma cannot be excluded. Dedicated chest CT is recommended for further evaluation.** Electronically Signed   By: Ilona Sorrel M.D.   On: 05/13/2016 18:18   Ct Cervical Spine Wo Contrast  Result Date: 05/13/2016 CLINICAL DATA:  Fall with head and right hip injury 2-3 hours prior. EXAM: CT HEAD WITHOUT CONTRAST CT CERVICAL SPINE WITHOUT CONTRAST TECHNIQUE: Multidetector CT imaging of the head and cervical spine was performed following the standard protocol without intravenous contrast. Multiplanar  CT image reconstructions of the cervical spine were also generated. COMPARISON:  12/01/2012 head and cervical spine CT. FINDINGS: CT HEAD FINDINGS Brain: No evidence of parenchymal hemorrhage or extra-axial fluid collection. No mass lesion, mass effect, or midline shift. No CT evidence of acute infarction. Intracranial atherosclerosis. Nonspecific moderate subcortical and periventricular white matter hypodensity, most in keeping with chronic small vessel ischemic change. No ventriculomegaly. Vascular: No hyperdense vessel or unexpected calcification. Skull: No evidence of calvarial fracture. Sinuses/Orbits: The visualized paranasal sinuses are essentially clear. Other:  The mastoid air cells are unopacified. CT CERVICAL SPINE FINDINGS Alignment: Reversal of the normal cervical lordosis. No subluxation. Dens is well positioned between the lateral masses of C1. Skull base and vertebrae: No acute fracture. No primary bone lesion or focal pathologic process. Soft tissues and spinal canal: No prevertebral fluid or swelling. No visible canal hematoma. Disc levels: Mild-to-moderate spondylosis in the lower cervical spine, most prominent at C6-7, not appreciably changed. Mild bilateral facet arthropathy. No significant bony foraminal stenosis. Upper chest: Partially visualized irregular apical left upper lobe pulmonary nodule measuring at least 2.1 cm (series 6/ image 75), not appreciated on 10/23/2014 PET-CT study. Apical 0.7 cm right upper lobe pulmonary nodule (  series 6/ image 67), not definitely seen on 10/23/2014 PET-CT. Centrilobular emphysema at the lung apices. Partially visualized left subclavian pacer leads. Other: Visualized mastoid air cells appear clear. No discrete thyroid nodules. No pathologically enlarged cervical nodes. Extracranial right carotid artery stent. IMPRESSION: 1. No evidence of acute intracranial abnormality. No evidence of calvarial fracture. 2. Moderate chronic small vessel ischemia. 3. No  cervical spine fracture or subluxation. 4. Mild degenerative changes in the cervical spine as described . 5. **An incidental finding of potential clinical significance has been found. Partially visualized irregular apical left upper lobe pulmonary nodule measuring at least 2.1 cm. Apical 0.7 cm right upper lobe pulmonary nodule. These nodules were not definitely seen on 10/23/2014 PET-CT study, and primary bronchogenic carcinoma cannot be excluded. Dedicated chest CT is recommended for further evaluation.** Electronically Signed   By: Ilona Sorrel M.D.   On: 05/13/2016 18:18   Dg Chest Portable 1 View  Result Date: 05/13/2016 CLINICAL DATA:  Fall. EXAM: PORTABLE CHEST 1 VIEW COMPARISON:  Radiographs of April 21, 2016. FINDINGS: Stable cardiomegaly. Atherosclerosis of thoracic aorta is noted. Left-sided pacemaker is unchanged in position. No pneumothorax or pleural effusion is noted. No acute pulmonary disease is noted. Bony thorax is unremarkable. IMPRESSION: Aortic atherosclerosis.  No acute cardiopulmonary abnormality seen. Electronically Signed   By: Marijo Conception, M.D.   On: 05/13/2016 18:34   Dg Hip Unilat W Or Wo Pelvis 2-3 Views Right  Result Date: 05/13/2016 CLINICAL DATA:  Right hip pain after fall. EXAM: DG HIP (WITH OR WITHOUT PELVIS) 2-3V RIGHT COMPARISON:  None. FINDINGS: Moderately displaced fracture is seen involving intertrochanteric region of proximal right femur. Vascular calcifications are noted IMPRESSION: Moderately displaced intertrochanteric fracture of proximal right femur. Electronically Signed   By: Marijo Conception, M.D.   On: 05/13/2016 18:32      IMPRESSION AND PLAN:   Left hip fracture The patient will be admitted to medical floor.  She has moderate to high risk of surgery. I will get cardiology consult from Dr. Nehemiah Massed for cough clearance. Follow-up Dr. Sabra Heck for right hip surgery. Hold aspirin and Plavix. Pain control, PT and DVT prophylaxis after  surgery.  Hyponatremia and hypokalemia. Adjust during hemodialysis.  ESRD. Nephrology consult for hemodialysis. Hypertension continue home hypertension medication.  CAD. Hold aspirin and Plavix before surgery, continue statin.  All the records are reviewed and case discussed with ED provider. Management plans discussed with the patient, Her daughter and they are in agreement.  CODE STATUS: Full code  TOTAL TIME TAKING CARE OF THIS PATIENT:57 minutes.    Demetrios Loll M.D on 05/13/2016 at 8:55 PM  Between 7am to 6pm - Pager - [phone]  After 6pm go to [email or web address] - Proofreader  [facility]  Office  [phone]  CC: Primary care physician; Madelyn Brunner, MD   Note: This dictation was prepared with Dragon dictation along with smaller phrase technology. Any transcriptional errors that result from this process are unintentional.

## 2016-05-13 NOTE — ED Notes (Signed)
Spoke to Dr Cinda Quest about placing indwelling cath instead of performing in and out for urine sample due to ? Hip fracture - per MD to place indwelling cath

## 2016-05-13 NOTE — ED Notes (Addendum)
48F foley cath inserted by Amber RN - this nurse was present with her - pt tolerated procedure well - pt had immediate flow of urine into tube but not enough to obtain urine sample - urine appeared thick and pink tinged - pt is dialysis pt but still makes small amounts of urine per pt and family

## 2016-05-13 NOTE — ED Provider Notes (Addendum)
[facility] Emergency Department Provider Note   ____________________________________________   First MD Initiated Contact with Patient 05/13/16 1734     (approximate)  I have reviewed the triage vital signs and the nursing notes.   HISTORY  Chief Complaint Fall and Hip Pain    HPI Patricia Woodard is a 82 y.o. female patient reportedly went to dialysis today and began having nausea and vomiting since then. She went to the bathroom 2-3 hours ago and fell. She complains of pain in her right hip. She told me she did not hit her head she told EMS she did hit her head she does have a bruise on no regular nose. He has a history of dementia. Patient only complains of pain in her hip at present she does not have or admit to any headache neck pain chest pain shortness of breath abdominal pain or anything else. The hip pain is actually worse in the hip on the right.   Past Medical History:  Diagnosis Date  . Arrhythmia   . Cancer ([place])    breast ca - left, kidney  . Dialysis AV fistula infection ([place])    Left arm Mon, Wed, Fri dialysis  . Hypertension   . MI, old   . Renal insufficiency    dialysis    There are no active problems to display for this patient.   Past Surgical History:  Procedure Laterality Date  . APPENDECTOMY    . AV FISTULA PLACEMENT Left   . CHOLECYSTECTOMY    . HIP FRACTURE SURGERY Left   . ICD LEAD REMOVAL N/A 11/04/2014   Procedure: ICD LEAD REMOVAL;  Surgeon: Isaias Cowman, MD;  Location: [facility] ORS;  Service: Cardiovascular;  Laterality: N/A;  . INSERT / REPLACE / REMOVE PACEMAKER    . kidney tumor Right     Prior to Admission medications   Medication Sig Start Date End Date Taking? Authorizing Provider  amLODipine (NORVASC) 10 MG tablet Take 10 mg by mouth daily.    Historical Provider, MD  aspirin EC 81 MG tablet Take 81 mg by mouth daily.    Historical Provider, MD  Azilsartan Medoxomil 80 MG TABS Take 1 tablet by mouth  daily.    Historical Provider, MD  carvedilol (COREG) 6.25 MG tablet TAKE ONE TABLET BY MOUTH TWICE DAILY 07/13/11   Jackolyn Confer, MD  clarithromycin (BIAXIN) 250 MG tablet Take 1 tablet (250 mg total) by mouth 2 (two) times daily. 11/04/14   Isaias Cowman, MD  cloNIDine (CATAPRES) 0.1 MG tablet Take 0.1 mg by mouth 3 (three) times daily.    Historical Provider, MD  clopidogrel (PLAVIX) 75 MG tablet Take 75 mg by mouth daily.    Historical Provider, MD  diphenhydrAMINE (BENADRYL) 25 MG tablet Take 25 mg by mouth every 6 (six) hours as needed.    Historical Provider, MD  esomeprazole (NEXIUM) 20 MG capsule Take 20 mg by mouth daily at 12 noon.    Historical Provider, MD  Furosemide (LASIX PO) Take 120 mg by mouth 4 (four) times a week.    Historical Provider, MD  furosemide (LASIX) 80 MG tablet TAKE ONE TABLET BY MOUTH EVERY DAY 12/04/11   Jackolyn Confer, MD  hydrALAZINE (APRESOLINE) 100 MG tablet TAKE ONE TABLET BY MOUTH THREE TIMES DAILY 07/17/12   Jackolyn Confer, MD  isosorbide mononitrate (IMDUR) 30 MG 24 hr tablet Take 30 mg by mouth daily.    Historical Provider, MD  levothyroxine (SYNTHROID,  LEVOTHROID) 100 MCG tablet Take 100 mcg by mouth daily before breakfast.    Historical Provider, MD  losartan (COZAAR) 50 MG tablet Take 50 mg by mouth at bedtime.    Historical Provider, MD  lovastatin (MEVACOR) 40 MG tablet TAKE ONE TABLET BY MOUTH AT BEDTIME 05/29/11   Jackolyn Confer, MD  mirtazapine (REMERON) 15 MG tablet Take 15 mg by mouth at bedtime.    Historical Provider, MD  multivitamin (RENA-VIT) TABS tablet Take 1 tablet by mouth daily. 0.8mg     Historical Provider, MD  PACERONE 200 MG tablet TAKE ONE TABLET BY MOUTH EVERY DAY 05/29/11   Jackolyn Confer, MD  SENSIPAR 30 MG tablet TAKE ONE TABLET BY MOUTH AT DINNER. 02/27/11   Jackolyn Confer, MD  sevelamer carbonate (RENVELA) 800 MG tablet Take 800 mg by mouth 3 (three) times daily with meals.    Historical Provider, MD     Allergies Penicillins; Ivp dye [iodinated diagnostic agents]; and Shellfish allergy  No family history on file.  Social History Social History  Substance Use Topics  . Smoking status: Former Smoker    Quit date: 10/17/2014  . Smokeless tobacco: Current User  . Alcohol use No    Review of Systems     Constitutional: No fever/chills Eyes: No visual changes. ENT: No sore throat. Cardiovascular: Denies chest pain. Respiratory: Denies shortness of breath. Gastrointestinal: No abdominal pain.   nausea,  vomiting.  No diarrhea.  No constipation. Genitourinary: Negative for dysuria. Musculoskeletal: Negative for back pain. Skin: Negative for rash. Neurological: Negative for headaches, focal weakness or numbness.  10-point ROS otherwise negative.  ____________________________________________   PHYSICAL EXAM:  VITAL SIGNS: ED Triage Vitals  Enc Vitals Group     BP 05/13/16 1734 (!) 154/82     Pulse Rate 05/13/16 1734 62     Resp 05/13/16 1734 16     Temp --      Temp src --      SpO2 05/13/16 1734 98 %     Weight 05/13/16 1735 120 lb (54.4 kg)     Height 05/13/16 1735 5\' 7"  (1.702 m)     Head Circumference --      Peak Flow --      Pain Score 05/13/16 1735 10     Pain Loc --      Pain Edu? --      Excl. in [place]? --     Constitutional: Alert and orientedTo person and hospital. Well appearing and in no acute distress. Eyes: Conjunctivae are normal. PERRL. EOMI. Head: Atraumatic. Except for small bruise on the bridge of her nose Nose: No congestion/rhinnorhea. And see head above Mouth/Throat: Mucous membranes are moist.  Oropharynx non-erythematous. Neck: No stridor. No cervical spine tenderness to palpation. Cardiovascular: Normal rate, regular rhythm. Grossly normal heart sounds.  Good peripheral circulation. Respiratory: Normal respiratory effort.  No retractions. Lungs CTAB. Gastrointestinal: Soft and nontender. No distention. No abdominal bruits. No CVA  tenderness. Musculoskeletal: Tenderness in the right hip no edema.  No joint effusions.  ____________________________________________   LABS (all labs ordered are listed, but only abnormal results are displayed)  Labs Reviewed  COMPREHENSIVE METABOLIC PANEL - Abnormal; Notable for the following:       Result Value   Sodium 130 (*)    Potassium 3.3 (*)    Chloride 91 (*)    Glucose, Bld 122 (*)    BUN 31 (*)    Creatinine, Ser 5.01 (*)  Calcium 8.6 (*)    Albumin 3.1 (*)    Alkaline Phosphatase 183 (*)    Total Bilirubin <0.1 (*)    GFR calc non Af Amer 7 (*)    GFR calc Af Amer 8 (*)    All other components within normal limits  CBC WITH DIFFERENTIAL/PLATELET - Abnormal; Notable for the following:    MCV 103.3 (*)    MCH 34.9 (*)    RDW 16.3 (*)    Platelets 126 (*)    All other components within normal limits  TROPONIN I  URINALYSIS COMPLETEWITH MICROSCOPIC ([facility] ONLY)   ____________________________________________  EKG  EKG read and interpreted by me shows what appears to be in atrial paced rhythm irregular baseline T waves are at least flat in the lateral chest leads and almost all of the limb leads computer is reading a prolonged QT interval QTC is calculated by the computer at 530 ms ____________________________________________  RADIOLOGY Study Result   CLINICAL DATA:  Fall with head and right hip injury 2-3 hours prior.  EXAM: CT HEAD WITHOUT CONTRAST  CT CERVICAL SPINE WITHOUT CONTRAST  TECHNIQUE: Multidetector CT imaging of the head and cervical spine was performed following the standard protocol without intravenous contrast. Multiplanar CT image reconstructions of the cervical spine were also generated.  COMPARISON:  12/01/2012 head and cervical spine CT.  FINDINGS: CT HEAD FINDINGS  Brain: No evidence of parenchymal hemorrhage or extra-axial fluid collection. No mass lesion, mass effect, or midline shift. No CT evidence of acute  infarction. Intracranial atherosclerosis. Nonspecific moderate subcortical and periventricular white matter hypodensity, most in keeping with chronic small vessel ischemic change. No ventriculomegaly.  Vascular: No hyperdense vessel or unexpected calcification.  Skull: No evidence of calvarial fracture.  Sinuses/Orbits: The visualized paranasal sinuses are essentially clear.  Other:  The mastoid air cells are unopacified.  CT CERVICAL SPINE FINDINGS  Alignment: Reversal of the normal cervical lordosis. No subluxation. Dens is well positioned between the lateral masses of C1.  Skull base and vertebrae: No acute fracture. No primary bone lesion or focal pathologic process.  Soft tissues and spinal canal: No prevertebral fluid or swelling. No visible canal hematoma.  Disc levels: Mild-to-moderate spondylosis in the lower cervical spine, most prominent at C6-7, not appreciably changed. Mild bilateral facet arthropathy. No significant bony foraminal stenosis.  Upper chest: Partially visualized irregular apical left upper lobe pulmonary nodule measuring at least 2.1 cm (series 6/ image 75), not appreciated on 10/23/2014 PET-CT study. Apical 0.7 cm right upper lobe pulmonary nodule (series 6/ image 67), not definitely seen on 10/23/2014 PET-CT. Centrilobular emphysema at the lung apices. Partially visualized left subclavian pacer leads.  Other: Visualized mastoid air cells appear clear. No discrete thyroid nodules. No pathologically enlarged cervical nodes. Extracranial right carotid artery stent.  IMPRESSION: 1. No evidence of acute intracranial abnormality. No evidence of calvarial fracture. 2. Moderate chronic small vessel ischemia. 3. No cervical spine fracture or subluxation. 4. Mild degenerative changes in the cervical spine as described . 5. **An incidental finding of potential clinical significance has been found. Partially visualized irregular apical left  upper lobe pulmonary nodule measuring at least 2.1 cm. Apical 0.7 cm right upper lobe pulmonary nodule. These nodules were not definitely seen on 10/23/2014 PET-CT study, and primary bronchogenic carcinoma cannot be excluded. Dedicated chest CT is recommended for further evaluation.**   Electronically Signed   By: Ilona Sorrel M.D.   On: 05/13/2016 18:18   Study Result   CLINICAL DATA:  Fall.  EXAM: PORTABLE CHEST 1 VIEW  COMPARISON:  Radiographs of April 21, 2016.  FINDINGS: Stable cardiomegaly. Atherosclerosis of thoracic aorta is noted. Left-sided pacemaker is unchanged in position. No pneumothorax or pleural effusion is noted. No acute pulmonary disease is noted. Bony thorax is unremarkable.  IMPRESSION: Aortic atherosclerosis.  No acute cardiopulmonary abnormality seen.   Electronically Signed   By: Marijo Conception, M.D.   On: 05/13/2016 18:34     Study Result   CLINICAL DATA:  Right hip pain after fall.  EXAM: DG HIP (WITH OR WITHOUT PELVIS) 2-3V RIGHT  COMPARISON:  None.  FINDINGS: Moderately displaced fracture is seen involving intertrochanteric region of proximal right femur. Vascular calcifications are noted  IMPRESSION: Moderately displaced intertrochanteric fracture of proximal right femur.   Electronically Signed   By: Marijo Conception, M.D.   On: 05/13/2016 18:32    ____________________________________________   PROCEDURES  Procedure(s) performed:   Procedures  Critical Care performed:  ____________________________________________   INITIAL IMPRESSION / ASSESSMENT AND PLAN / ED COURSE  Pertinent labs & imaging results that were available during my care of the patient were reviewed by me and considered in my medical decision making (see chart for details).    Clinical Course      ____________________________________________   FINAL CLINICAL IMPRESSION(S) / ED DIAGNOSES  Final diagnoses:  Closed  fracture of right hip, initial encounter ([place])      NEW MEDICATIONS STARTED DURING THIS VISIT:  New Prescriptions   No medications on file     Note:  This document was prepared using Dragon voice recognition software and may include unintentional dictation errors.    Nena Polio, MD 05/13/16 1839  Discussed with Dr. Sabra Heck orthopedics he wishes internal medicine to admit and stabilize the patient with her I couldn't hypokalemia hyponatremia etc. he will plan on doing surgery tomorrow also needs renal to evaluate her.    Nena Polio, MD 05/13/16 [phone]

## 2016-05-13 NOTE — ED Notes (Signed)
Pt arrived via ems for c/o fall - pt fell in bathroom approx 2-3 hours ago - c/o right hip pain - obvious shortening and rotation of right leg - pt has been vomiting since dialysis this am - goes to dialysis M-W-F - pt has fistula in left arm and pt has a pacemaker with defibrillator - pt reported to ems that she hit her head when she fell - no LOC reported - pt does have history of dementia - pt has equal pedal pulses

## 2016-05-13 NOTE — ED Triage Notes (Signed)
Pt arrived via ems for c/o fall - pt fell in bathroom approx 2-3 hours ago - c/o right hip pain - obvious shortening and rotation of right leg - pt has been vomiting since dialysis this am - goes to dialysis M-W-F - pt has fistula in left arm and pt has a pacemaker with defibrillator - pt reported to ems that she hit her head when she fell - no LOC reported - pt does have history of dementia

## 2016-05-14 ENCOUNTER — Inpatient Hospital Stay: Payer: Medicare Other | Admitting: Anesthesiology

## 2016-05-14 ENCOUNTER — Inpatient Hospital Stay: Payer: Medicare Other

## 2016-05-14 ENCOUNTER — Encounter
Admission: EM | Disposition: A | Discharge status: Skilled Nursing Facility | Payer: Self-pay | Source: Home / Self Care | Attending: Internal Medicine

## 2016-05-14 DIAGNOSIS — J9600 Acute respiratory failure, unspecified whether with hypoxia or hypercapnia: Secondary | ICD-10-CM | POA: Diagnosis present

## 2016-05-14 DIAGNOSIS — I9581 Postprocedural hypotension: Secondary | ICD-10-CM | POA: Diagnosis not present

## 2016-05-14 DIAGNOSIS — S72001A Fracture of unspecified part of neck of right femur, initial encounter for closed fracture: Secondary | ICD-10-CM

## 2016-05-14 DIAGNOSIS — I959 Hypotension, unspecified: Secondary | ICD-10-CM

## 2016-05-14 DIAGNOSIS — I272 Pulmonary hypertension, unspecified: Secondary | ICD-10-CM

## 2016-05-14 DIAGNOSIS — J9601 Acute respiratory failure with hypoxia: Secondary | ICD-10-CM

## 2016-05-14 HISTORY — PX: ORIF HIP FRACTURE: SHX2125

## 2016-05-14 LAB — CBC
HCT: 36.5 % (ref 35.0–47.0)
HCT: 33.4 % — ABNORMAL LOW (ref 35.0–47.0)
HEMOGLOBIN: 12.4 g/dL (ref 12.0–16.0)
HEMOGLOBIN: 10.5 g/dL — AB (ref 12.0–16.0)
MCH: 35.1 pg — AB (ref 26.0–34.0)
MCH: 34.6 pg — AB (ref 26.0–34.0)
MCHC: 33.9 g/dL (ref 32.0–36.0)
MCHC: 31.5 g/dL — ABNORMAL LOW (ref 32.0–36.0)
MCV: 103.6 fL — ABNORMAL HIGH (ref 80.0–100.0)
MCV: 109.7 fL — ABNORMAL HIGH (ref 80.0–100.0)
PLATELETS: 106 10*3/uL — AB (ref 150–440)
PLATELETS: 78 10*3/uL — AB (ref 150–440)
RBC: 3.52 MIL/uL — ABNORMAL LOW (ref 3.80–5.20)
RBC: 3.05 MIL/uL — AB (ref 3.80–5.20)
RDW: 16.5 % — AB (ref 11.5–14.5)
RDW: 18 % — ABNORMAL HIGH (ref 11.5–14.5)
WBC: 5.1 10*3/uL (ref 3.6–11.0)
WBC: 6.5 10*3/uL (ref 3.6–11.0)

## 2016-05-14 LAB — BLOOD GAS, ARTERIAL
Acid-base deficit: 4 mmol/L — ABNORMAL HIGH (ref 0.0–2.0)
BICARBONATE: 22.6 mmol/L (ref 20.0–28.0)
FIO2: 0.28
MECHANICAL RATE: 12
O2 SAT: 95.4 %
PATIENT TEMPERATURE: 37
PCO2 ART: 47 mmHg (ref 32.0–48.0)
PEEP: 5 cmH2O
PO2 ART: 87 mmHg (ref 83.0–108.0)
VT: 500 mL
pH, Arterial: 7.29 — ABNORMAL LOW (ref 7.350–7.450)

## 2016-05-14 LAB — BASIC METABOLIC PANEL
Anion gap: 10 (ref 5–15)
BUN: 35 mg/dL — AB (ref 6–20)
CALCIUM: 8.4 mg/dL — AB (ref 8.9–10.3)
CO2: 27 mmol/L (ref 22–32)
CREATININE: 5.72 mg/dL — AB (ref 0.44–1.00)
Chloride: 95 mmol/L — ABNORMAL LOW (ref 101–111)
GFR, EST AFRICAN AMERICAN: 7 mL/min — AB (ref >60)
GFR, EST NON AFRICAN AMERICAN: 6 mL/min — AB (ref >60)
Glucose, Bld: 85 mg/dL (ref 65–99)
Potassium: 4.5 mmol/L (ref 3.5–5.1)
SODIUM: 132 mmol/L — AB (ref 135–145)

## 2016-05-14 LAB — TROPONIN I: Troponin I: <0.03 ng/mL

## 2016-05-14 LAB — BASIC METABOLIC PANEL WITH GFR
Anion gap: 14 (ref 5–15)
BUN: 38 mg/dL — ABNORMAL HIGH (ref 6–20)
CO2: 19 mmol/L — ABNORMAL LOW (ref 22–32)
Calcium: 7.8 mg/dL — ABNORMAL LOW (ref 8.9–10.3)
Chloride: 98 mmol/L — ABNORMAL LOW (ref 101–111)
Creatinine, Ser: 5.91 mg/dL — ABNORMAL HIGH (ref 0.44–1.00)
GFR calc Af Amer: 7 mL/min — ABNORMAL LOW
GFR calc non Af Amer: 6 mL/min — ABNORMAL LOW
Glucose, Bld: 142 mg/dL — ABNORMAL HIGH (ref 65–99)
Potassium: 5.1 mmol/L (ref 3.5–5.1)
Sodium: 131 mmol/L — ABNORMAL LOW (ref 135–145)

## 2016-05-14 LAB — MAGNESIUM: Magnesium: 2 mg/dL (ref 1.7–2.4)

## 2016-05-14 LAB — GLUCOSE, CAPILLARY: Glucose-Capillary: 123 mg/dL — ABNORMAL HIGH (ref 65–99)

## 2016-05-14 LAB — SURGICAL PCR SCREEN
MRSA, PCR: NEGATIVE
Staphylococcus aureus: NEGATIVE

## 2016-05-14 SURGERY — OPEN REDUCTION INTERNAL FIXATION HIP
Anesthesia: General | Laterality: Right

## 2016-05-14 MED ORDER — CEFAZOLIN SODIUM-DEXTROSE 2-3 GM-% IV SOLR
INTRAVENOUS | Status: DC | PRN
  Administered 2016-05-14: 2 g via INTRAVENOUS

## 2016-05-14 MED ORDER — POTASSIUM CHLORIDE IN NACL 20-0.9 MEQ/L-% IV SOLN
INTRAVENOUS | Status: AC
  Filled 2016-05-14: qty 1000

## 2016-05-14 MED ORDER — GLYCOPYRROLATE 0.2 MG/ML IJ SOLN
INTRAMUSCULAR | Status: DC | PRN
  Administered 2016-05-14: 0.4 mg via INTRAVENOUS

## 2016-05-14 MED ORDER — ALUM & MAG HYDROXIDE-SIMETH 200-200-20 MG/5ML PO SUSP: 30.0000 mL | ORAL | Status: DC | PRN

## 2016-05-14 MED ORDER — CHLORHEXIDINE GLUCONATE 0.12% ORAL RINSE (MEDLINE KIT)
15.0000 mL | Freq: Two times a day (BID) | OROMUCOSAL | Status: DC
  Administered 2016-05-14 – 2016-05-16 (×3): 15 mL via OROMUCOSAL

## 2016-05-14 MED ORDER — FLEET ENEMA 7-19 GM/118ML RE ENEM: 1.0000 | ENEMA | Freq: Once | RECTAL | Status: DC | PRN

## 2016-05-14 MED ORDER — ROCURONIUM BROMIDE 100 MG/10ML IV SOLN
INTRAVENOUS | Status: DC | PRN
  Administered 2016-05-14: 20 mg via INTRAVENOUS

## 2016-05-14 MED ORDER — FENTANYL CITRATE (PF) 100 MCG/2ML IJ SOLN: 25.0000 ug | INTRAMUSCULAR | Status: DC | PRN

## 2016-05-14 MED ORDER — MENTHOL 3 MG MT LOZG
1.0000 | LOZENGE | OROMUCOSAL | Status: DC | PRN
  Filled 2016-05-14: qty 9

## 2016-05-14 MED ORDER — SODIUM CHLORIDE 0.9 % IV SOLN
INTRAVENOUS | Status: DC | PRN
  Administered 2016-05-14: 35 ug/min via INTRAVENOUS

## 2016-05-14 MED ORDER — NEOMYCIN-POLYMYXIN B GU 40-200000 IR SOLN
Status: DC | PRN
  Administered 2016-05-14: 2 mL

## 2016-05-14 MED ORDER — PROMETHAZINE HCL 25 MG/ML IJ SOLN: 6.2500 mg | INTRAMUSCULAR | Status: DC | PRN

## 2016-05-14 MED ORDER — CEFAZOLIN SODIUM-DEXTROSE 2-4 GM/100ML-% IV SOLN
2.0000 g | Freq: Four times a day (QID) | INTRAVENOUS | Status: AC
  Administered 2016-05-14 – 2016-05-15 (×3): 2 g via INTRAVENOUS
  Filled 2016-05-14 (×3): qty 100

## 2016-05-14 MED ORDER — NEOSTIGMINE METHYLSULFATE 10 MG/10ML IV SOLN
INTRAVENOUS | Status: DC | PRN
  Administered 2016-05-14: 2 mg via INTRAVENOUS
  Administered 2016-05-14: 3 mg via INTRAVENOUS
  Administered 2016-05-14 (×2): 1 mg via INTRAVENOUS

## 2016-05-14 MED ORDER — FENTANYL CITRATE (PF) 100 MCG/2ML IJ SOLN
25.0000 ug | INTRAMUSCULAR | Status: DC | PRN
  Administered 2016-05-14 – 2016-05-15 (×2): 50 ug via INTRAVENOUS
  Administered 2016-05-15: 25 ug via INTRAVENOUS
  Administered 2016-05-15: 50 ug via INTRAVENOUS
  Administered 2016-05-15: 25 ug via INTRAVENOUS
  Administered 2016-05-15 – 2016-05-16 (×2): 50 ug via INTRAVENOUS
  Administered 2016-05-16: 25 ug via INTRAVENOUS
  Filled 2016-05-14 (×7): qty 2

## 2016-05-14 MED ORDER — BISACODYL 10 MG RE SUPP: 10.0000 mg | Freq: Every day | RECTAL | Status: DC | PRN

## 2016-05-14 MED ORDER — OXYCODONE HCL 5 MG/5ML PO SOLN: 5.0000 mg | Freq: Once | ORAL | Status: DC | PRN

## 2016-05-14 MED ORDER — ONDANSETRON HCL 4 MG/2ML IJ SOLN
INTRAMUSCULAR | Status: AC
  Filled 2016-05-14: qty 2

## 2016-05-14 MED ORDER — METOCLOPRAMIDE HCL 10 MG PO TABS: 5.0000 mg | ORAL_TABLET | Freq: Three times a day (TID) | ORAL | Status: DC | PRN

## 2016-05-14 MED ORDER — BUPIVACAINE-EPINEPHRINE 0.5% -1:200000 IJ SOLN
INTRAMUSCULAR | Status: DC | PRN
  Administered 2016-05-14: 26 mL

## 2016-05-14 MED ORDER — PHENYLEPHRINE HCL 10 MG/ML IJ SOLN
0.0000 ug/min | INTRAVENOUS | Status: DC
  Administered 2016-05-14: 40 ug/min via INTRAVENOUS
  Administered 2016-05-14: 90 ug/min via INTRAVENOUS
  Administered 2016-05-14: 50 ug/min via INTRAVENOUS
  Administered 2016-05-14: 20 ug/min via INTRAVENOUS
  Administered 2016-05-14: 80 ug/min via INTRAVENOUS
  Administered 2016-05-14: 70 ug/min via INTRAVENOUS
  Administered 2016-05-14: 60 ug/min via INTRAVENOUS
  Administered 2016-05-14: 100 ug/min via INTRAVENOUS
  Administered 2016-05-14: 90 ug/min via INTRAVENOUS
  Administered 2016-05-14: 80 ug/min via INTRAVENOUS
  Administered 2016-05-14: 100 ug/min via INTRAVENOUS
  Administered 2016-05-15: 90 ug/min via INTRAVENOUS
  Administered 2016-05-15: 70 ug/min via INTRAVENOUS
  Administered 2016-05-15: 80 ug/min via INTRAVENOUS
  Administered 2016-05-15: 30 ug/min via INTRAVENOUS
  Administered 2016-05-15: 80 ug/min via INTRAVENOUS
  Administered 2016-05-15: 30 ug/min via INTRAVENOUS
  Administered 2016-05-15 (×2): 60 ug/min via INTRAVENOUS
  Filled 2016-05-14 (×13): qty 1

## 2016-05-14 MED ORDER — FERROUS SULFATE 325 (65 FE) MG PO TABS
325.0000 mg | ORAL_TABLET | Freq: Every day | ORAL | Status: DC
  Administered 2016-05-16 – 2016-05-17 (×2): 325 mg via ORAL
  Filled 2016-05-14 (×2): qty 1

## 2016-05-14 MED ORDER — ONDANSETRON HCL 4 MG/2ML IJ SOLN: 4.0000 mg | Freq: Once | INTRAMUSCULAR | Status: DC

## 2016-05-14 MED ORDER — SODIUM CHLORIDE 0.45 % IV SOLN
INTRAVENOUS | Status: DC
  Administered 2016-05-14: 16:00:00 via INTRAVENOUS

## 2016-05-14 MED ORDER — FENTANYL CITRATE (PF) 100 MCG/2ML IJ SOLN
INTRAMUSCULAR | Status: DC | PRN
  Administered 2016-05-14: 25 ug via INTRAVENOUS

## 2016-05-14 MED ORDER — SODIUM CHLORIDE 0.9 % IV SOLN
INTRAVENOUS | Status: DC | PRN
  Administered 2016-05-14: 10:00:00 via INTRAVENOUS

## 2016-05-14 MED ORDER — ORAL CARE MOUTH RINSE
15.0000 mL | Freq: Four times a day (QID) | OROMUCOSAL | Status: DC
  Administered 2016-05-14 – 2016-05-17 (×8): 15 mL via OROMUCOSAL

## 2016-05-14 MED ORDER — ACETAMINOPHEN 500 MG PO TABS: 1000.0000 mg | ORAL_TABLET | Freq: Four times a day (QID) | ORAL | Status: AC

## 2016-05-14 MED ORDER — MIDAZOLAM HCL 2 MG/2ML IJ SOLN
INTRAMUSCULAR | Status: DC | PRN
  Administered 2016-05-14: 0.5 mg via INTRAVENOUS

## 2016-05-14 MED ORDER — NEOMYCIN-POLYMYXIN B GU 40-200000 IR SOLN
Status: AC
  Filled 2016-05-14: qty 2

## 2016-05-14 MED ORDER — METOCLOPRAMIDE HCL 5 MG/ML IJ SOLN
5.0000 mg | Freq: Three times a day (TID) | INTRAMUSCULAR | Status: DC | PRN
Start: 2016-05-14 — End: 2016-05-17

## 2016-05-14 MED ORDER — CLINDAMYCIN PHOSPHATE 600 MG/50ML IV SOLN
600.0000 mg | Freq: Three times a day (TID) | INTRAVENOUS | Status: AC
  Administered 2016-05-14 – 2016-05-15 (×3): 600 mg via INTRAVENOUS
  Filled 2016-05-14 (×3): qty 50

## 2016-05-14 MED ORDER — BUPIVACAINE-EPINEPHRINE (PF) 0.5% -1:200000 IJ SOLN
INTRAMUSCULAR | Status: AC
  Filled 2016-05-14: qty 30

## 2016-05-14 MED ORDER — VASOPRESSIN 20 UNIT/ML IV SOLN
INTRAVENOUS | Status: DC | PRN
  Administered 2016-05-14: 1 [IU] via INTRAVENOUS
  Administered 2016-05-14: 2 [IU] via INTRAVENOUS
  Administered 2016-05-14: 1 [IU] via INTRAVENOUS

## 2016-05-14 MED ORDER — OXYCODONE HCL 5 MG PO TABS: 5.0000 mg | ORAL_TABLET | Freq: Once | ORAL | Status: DC | PRN

## 2016-05-14 MED ORDER — HEPARIN SODIUM (PORCINE) 5000 UNIT/ML IJ SOLN
5000.0000 [IU] | Freq: Two times a day (BID) | INTRAMUSCULAR | Status: DC
  Administered 2016-05-14 – 2016-05-17 (×6): 5000 [IU] via SUBCUTANEOUS
  Filled 2016-05-14 (×6): qty 1

## 2016-05-14 MED ORDER — SODIUM CHLORIDE 0.9 % IV SOLN: 250.0000 mL | INTRAVENOUS | Status: DC | PRN

## 2016-05-14 MED ORDER — MEPERIDINE HCL 25 MG/ML IJ SOLN: 6.2500 mg | INTRAMUSCULAR | Status: DC | PRN

## 2016-05-14 MED ORDER — POTASSIUM CHLORIDE IN NACL 20-0.9 MEQ/L-% IV SOLN
INTRAVENOUS | Status: DC
  Administered 2016-05-14: 50 mL/h via INTRAVENOUS
  Filled 2016-05-14 (×2): qty 1000

## 2016-05-14 MED ORDER — ASPIRIN EC 325 MG PO TBEC
325.0000 mg | DELAYED_RELEASE_TABLET | Freq: Every day | ORAL | Status: DC
  Administered 2016-05-16: 325 mg via ORAL
  Filled 2016-05-14: qty 1

## 2016-05-14 MED ORDER — NEOSTIGMINE METHYLSULFATE 10 MG/10ML IV SOLN
INTRAVENOUS | Status: AC
  Filled 2016-05-14: qty 1

## 2016-05-14 MED ORDER — PHENYLEPHRINE HCL 10 MG/ML IJ SOLN
INTRAMUSCULAR | Status: DC | PRN
  Administered 2016-05-14 (×3): 100 ug via INTRAVENOUS

## 2016-05-14 MED ORDER — MAGNESIUM HYDROXIDE 400 MG/5ML PO SUSP: 30.0000 mL | Freq: Every day | ORAL | Status: DC | PRN

## 2016-05-14 MED ORDER — SUCCINYLCHOLINE CHLORIDE 20 MG/ML IJ SOLN
INTRAMUSCULAR | Status: DC | PRN
  Administered 2016-05-14: 80 mg via INTRAVENOUS

## 2016-05-14 MED ORDER — PROPOFOL 10 MG/ML IV BOLUS
INTRAVENOUS | Status: DC | PRN
  Administered 2016-05-14: 100 mg via INTRAVENOUS

## 2016-05-14 MED ORDER — PHENOL 1.4 % MT LIQD
1.0000 | OROMUCOSAL | Status: DC | PRN
  Filled 2016-05-14: qty 177

## 2016-05-14 SURGICAL SUPPLY — 37 items
BIT DRILL SPINE 4.0MMX260 (BIT) ×1
BIT DRILL SPINE 4.0X260 (BIT) ×2 IMPLANT
BLADE INTRAMED NAIL 11X85 (Orthopedic Implant) ×2 IMPLANT
BLADE INTRAMED NAIL 11X85MML (Orthopedic Implant) ×1 IMPLANT
BNDG COHESIVE 4X5 TAN STRL (GAUZE/BANDAGES/DRESSINGS) ×3 IMPLANT
CANISTER SUCT 1200ML W/VALVE (MISCELLANEOUS) ×3 IMPLANT
CHLORAPREP W/TINT 26ML (MISCELLANEOUS) ×3 IMPLANT
DRAPE C-ARMOR (DRAPES) ×3 IMPLANT
DRAPE INCISE 23X17 IOBAN STRL (DRAPES) ×2
DRAPE INCISE IOBAN 23X17 STRL (DRAPES) ×1 IMPLANT
DRSG AQUACEL AG ADV 3.5X10 (GAUZE/BANDAGES/DRESSINGS) IMPLANT
DRSG AQUACEL AG ADV 3.5X14 (GAUZE/BANDAGES/DRESSINGS) ×3 IMPLANT
ELECT REM PT RETURN 9FT ADLT (ELECTROSURGICAL) ×3
ELECTRODE REM PT RTRN 9FT ADLT (ELECTROSURGICAL) ×1 IMPLANT
GAUZE PETRO XEROFOAM 1X8 (MISCELLANEOUS) IMPLANT
GAUZE SPONGE 4X4 12PLY STRL (GAUZE/BANDAGES/DRESSINGS) ×3 IMPLANT
GLOVE INDICATOR 8.0 STRL GRN (GLOVE) ×3 IMPLANT
GLOVE SURG ORTHO 8.5 STRL (GLOVE) ×3 IMPLANT
GOWN STRL REUS W/ TWL LRG LVL3 (GOWN DISPOSABLE) ×1 IMPLANT
GOWN STRL REUS W/TWL LRG LVL3 (GOWN DISPOSABLE) ×2
GOWN STRL REUS W/TWL LRG LVL4 (GOWN DISPOSABLE) ×3 IMPLANT
GUIDEWIRE 3.2X400 (WIRE) ×3 IMPLANT
KIT RM TURNOVER STRD PROC AR (KITS) ×3 IMPLANT
MAT BLUE FLOOR 46X72 FLO (MISCELLANEOUS) ×3 IMPLANT
NAIL TROCH FX 11/130D 170-S (Nail) ×3 IMPLANT
NEEDLE SPNL 18GX3.5 QUINCKE PK (NEEDLE) ×3 IMPLANT
NS IRRIG 500ML POUR BTL (IV SOLUTION) ×3 IMPLANT
PACK HIP COMPR (MISCELLANEOUS) ×3 IMPLANT
REAMER ROD DEEP FLUTE 2.5X950 (INSTRUMENTS) IMPLANT
SCREW LOCK TI 5.0X38 F/IM NAIL (Screw) ×3 IMPLANT
STAPLER SKIN PROX 35W (STAPLE) ×3 IMPLANT
SUCTION FRAZIER HANDLE 10FR (MISCELLANEOUS) ×2
SUCTION TUBE FRAZIER 10FR DISP (MISCELLANEOUS) ×1 IMPLANT
SUT VIC AB 0 CT1 36 (SUTURE) ×3 IMPLANT
SUT VIC AB 2-0 CT1 27 (SUTURE) ×2
SUT VIC AB 2-0 CT1 TAPERPNT 27 (SUTURE) ×1 IMPLANT
SYR 30ML LL (SYRINGE) ×3 IMPLANT

## 2016-05-14 NOTE — Progress Notes (Signed)
[facility] NAME: Patricia Woodard    MR#:  [id number]  DATE OF BIRTH:  15-Dec-1931  SUBJECTIVE:  CHIEF COMPLAINT:   Chief Complaint  Patient presents with  . Fall  . Hip Pain   - Admitted with fall and right hip fracture. -Patient had dialysis yesterday per schedule. Denies any chest pain or dyspnea at this time. -Scheduled for surgery this morning.  REVIEW OF SYSTEMS:  Review of Systems  Constitutional: Negative for chills, fever and malaise/fatigue.  HENT: Negative for congestion, ear discharge, hearing loss and nosebleeds.   Eyes: Negative for blurred vision and double vision.  Respiratory: Negative for cough, shortness of breath and wheezing.   Cardiovascular: Negative for chest pain, palpitations and leg swelling.  Gastrointestinal: Negative for abdominal pain, constipation, diarrhea, nausea and vomiting.  Genitourinary: Negative for dysuria.  Musculoskeletal: Positive for falls, joint pain and myalgias.  Neurological: Negative for dizziness, sensory change, speech change, focal weakness, seizures and headaches.  Psychiatric/Behavioral: Negative for depression.    DRUG ALLERGIES:   Allergies  Allergen Reactions  . Penicillins Itching  . Ivp Dye [Iodinated Diagnostic Agents] Rash  . Shellfish Allergy Rash    VITALS:  Blood pressure (!) 125/45, pulse 60, temperature 98.9 F (37.2 C), temperature source Oral, resp. rate 16, height 5\' 7"  (1.702 m), weight 58.1 kg (128 lb), SpO2 91 %.  PHYSICAL EXAMINATION:  Physical Exam  GENERAL:  82 y.o.-year-old Elderly patient lying in the bed with no acute distress.  EYES: Pupils equal, round, reactive to light and accommodation. No scleral icterus. Extraocular muscles intact.  HEENT: Head atraumatic, normocephalic. Oropharynx and nasopharynx clear.  NECK:  Supple, no jugular venous distention. No thyroid enlargement, no tenderness.  LUNGS: Normal breath sounds bilaterally, no  wheezing, rales,rhonchi or crepitation. No use of accessory muscles of respiration. Diminished bibasilar breath sounds. CARDIOVASCULAR: S1, S2 normal. No  rubs, or gallops. 2/6 systolic murmur is present ABDOMEN: Soft, nontender, nondistended. Bowel sounds present. No organomegaly or mass.  EXTREMITIES: No pedal edema, cyanosis, or clubbing. Right leg in traction. Left forearm AV fistula with good thrill noted NEUROLOGIC: Cranial nerves II through XII are intact. Muscle strength 5/5 in all extremities except right leg movement due to fracture. Sensation intact. Gait not checked.  PSYCHIATRIC: The patient is alert and oriented x 3.  SKIN: No obvious rash, lesion, or ulcer.    LABORATORY PANEL:   CBC  Recent Labs Lab 05/14/16 0648  WBC 5.1  HGB 12.4  HCT 36.5  PLT 106*   ------------------------------------------------------------------------------------------------------------------  Chemistries   Recent Labs Lab 05/13/16 2116 05/14/16 0648  NA 131* 132*  K 3.6 4.5  CL 92* 95*  CO2 26 27  GLUCOSE 130* 85  BUN 34* 35*  CREATININE 5.27* 5.72*  CALCIUM 8.6* 8.4*  AST 106*  --   ALT 35  --   ALKPHOS 211*  --   BILITOT 1.2  --    ------------------------------------------------------------------------------------------------------------------  Cardiac Enzymes  Recent Labs Lab 05/13/16 1736  TROPONINI <0.03   ------------------------------------------------------------------------------------------------------------------  RADIOLOGY:  Ct Head Wo Contrast  Result Date: 05/13/2016 CLINICAL DATA:  Fall with head and right hip injury 2-3 hours prior. EXAM: CT HEAD WITHOUT CONTRAST CT CERVICAL SPINE WITHOUT CONTRAST TECHNIQUE: Multidetector CT imaging of the head and cervical spine was performed following the standard protocol without intravenous contrast. Multiplanar CT image reconstructions of the cervical spine were also generated. COMPARISON:  12/01/2012 head and  cervical spine CT.  FINDINGS: CT HEAD FINDINGS Brain: No evidence of parenchymal hemorrhage or extra-axial fluid collection. No mass lesion, mass effect, or midline shift. No CT evidence of acute infarction. Intracranial atherosclerosis. Nonspecific moderate subcortical and periventricular white matter hypodensity, most in keeping with chronic small vessel ischemic change. No ventriculomegaly. Vascular: No hyperdense vessel or unexpected calcification. Skull: No evidence of calvarial fracture. Sinuses/Orbits: The visualized paranasal sinuses are essentially clear. Other:  The mastoid air cells are unopacified. CT CERVICAL SPINE FINDINGS Alignment: Reversal of the normal cervical lordosis. No subluxation. Dens is well positioned between the lateral masses of C1. Skull base and vertebrae: No acute fracture. No primary bone lesion or focal pathologic process. Soft tissues and spinal canal: No prevertebral fluid or swelling. No visible canal hematoma. Disc levels: Mild-to-moderate spondylosis in the lower cervical spine, most prominent at C6-7, not appreciably changed. Mild bilateral facet arthropathy. No significant bony foraminal stenosis. Upper chest: Partially visualized irregular apical left upper lobe pulmonary nodule measuring at least 2.1 cm (series 6/ image 75), not appreciated on 10/23/2014 PET-CT study. Apical 0.7 cm right upper lobe pulmonary nodule (series 6/ image 67), not definitely seen on 10/23/2014 PET-CT. Centrilobular emphysema at the lung apices. Partially visualized left subclavian pacer leads. Other: Visualized mastoid air cells appear clear. No discrete thyroid nodules. No pathologically enlarged cervical nodes. Extracranial right carotid artery stent. IMPRESSION: 1. No evidence of acute intracranial abnormality. No evidence of calvarial fracture. 2. Moderate chronic small vessel ischemia. 3. No cervical spine fracture or subluxation. 4. Mild degenerative changes in the cervical spine as described  . 5. **An incidental finding of potential clinical significance has been found. Partially visualized irregular apical left upper lobe pulmonary nodule measuring at least 2.1 cm. Apical 0.7 cm right upper lobe pulmonary nodule. These nodules were not definitely seen on 10/23/2014 PET-CT study, and primary bronchogenic carcinoma cannot be excluded. Dedicated chest CT is recommended for further evaluation.** Electronically Signed   By: Ilona Sorrel M.D.   On: 05/13/2016 18:18   Ct Cervical Spine Wo Contrast  Result Date: 05/13/2016 CLINICAL DATA:  Fall with head and right hip injury 2-3 hours prior. EXAM: CT HEAD WITHOUT CONTRAST CT CERVICAL SPINE WITHOUT CONTRAST TECHNIQUE: Multidetector CT imaging of the head and cervical spine was performed following the standard protocol without intravenous contrast. Multiplanar CT image reconstructions of the cervical spine were also generated. COMPARISON:  12/01/2012 head and cervical spine CT. FINDINGS: CT HEAD FINDINGS Brain: No evidence of parenchymal hemorrhage or extra-axial fluid collection. No mass lesion, mass effect, or midline shift. No CT evidence of acute infarction. Intracranial atherosclerosis. Nonspecific moderate subcortical and periventricular white matter hypodensity, most in keeping with chronic small vessel ischemic change. No ventriculomegaly. Vascular: No hyperdense vessel or unexpected calcification. Skull: No evidence of calvarial fracture. Sinuses/Orbits: The visualized paranasal sinuses are essentially clear. Other:  The mastoid air cells are unopacified. CT CERVICAL SPINE FINDINGS Alignment: Reversal of the normal cervical lordosis. No subluxation. Dens is well positioned between the lateral masses of C1. Skull base and vertebrae: No acute fracture. No primary bone lesion or focal pathologic process. Soft tissues and spinal canal: No prevertebral fluid or swelling. No visible canal hematoma. Disc levels: Mild-to-moderate spondylosis in the lower  cervical spine, most prominent at C6-7, not appreciably changed. Mild bilateral facet arthropathy. No significant bony foraminal stenosis. Upper chest: Partially visualized irregular apical left upper lobe pulmonary nodule measuring at least 2.1 cm (series 6/ image 75), not appreciated on 10/23/2014 PET-CT study. Apical 0.7 cm right  upper lobe pulmonary nodule (series 6/ image 67), not definitely seen on 10/23/2014 PET-CT. Centrilobular emphysema at the lung apices. Partially visualized left subclavian pacer leads. Other: Visualized mastoid air cells appear clear. No discrete thyroid nodules. No pathologically enlarged cervical nodes. Extracranial right carotid artery stent. IMPRESSION: 1. No evidence of acute intracranial abnormality. No evidence of calvarial fracture. 2. Moderate chronic small vessel ischemia. 3. No cervical spine fracture or subluxation. 4. Mild degenerative changes in the cervical spine as described . 5. **An incidental finding of potential clinical significance has been found. Partially visualized irregular apical left upper lobe pulmonary nodule measuring at least 2.1 cm. Apical 0.7 cm right upper lobe pulmonary nodule. These nodules were not definitely seen on 10/23/2014 PET-CT study, and primary bronchogenic carcinoma cannot be excluded. Dedicated chest CT is recommended for further evaluation.** Electronically Signed   By: Ilona Sorrel M.D.   On: 05/13/2016 18:18   Dg Chest Portable 1 View  Result Date: 05/13/2016 CLINICAL DATA:  Fall. EXAM: PORTABLE CHEST 1 VIEW COMPARISON:  Radiographs of April 21, 2016. FINDINGS: Stable cardiomegaly. Atherosclerosis of thoracic aorta is noted. Left-sided pacemaker is unchanged in position. No pneumothorax or pleural effusion is noted. No acute pulmonary disease is noted. Bony thorax is unremarkable. IMPRESSION: Aortic atherosclerosis.  No acute cardiopulmonary abnormality seen. Electronically Signed   By: Marijo Conception, M.D.   On: 05/13/2016  18:34   Dg Hip Unilat W Or Wo Pelvis 2-3 Views Right  Result Date: 05/13/2016 CLINICAL DATA:  Right hip pain after fall. EXAM: DG HIP (WITH OR WITHOUT PELVIS) 2-3V RIGHT COMPARISON:  None. FINDINGS: Moderately displaced fracture is seen involving intertrochanteric region of proximal right femur. Vascular calcifications are noted IMPRESSION: Moderately displaced intertrochanteric fracture of proximal right femur. Electronically Signed   By: Marijo Conception, M.D.   On: 05/13/2016 18:32    EKG:   Orders placed or performed during the hospital encounter of 05/13/16  . ED EKG  . ED EKG  . EKG 12-Lead  . EKG 12-Lead    ASSESSMENT AND PLAN:   82 year old female with past medical history significant for chronic atrial fibrillation and arrhythmia status post ICD and pacemaker, CAD status post PCI in remote past, end-stage renal disease on Monday Wednesday Friday hemodialysis, history of breast cancer, hypertension and diastolic heart failure presents to hospital after fall and right hip fracture.  #1 right femur intertrochanteric fracture-secondary to mechanical fall. -Patient is moderate risk for noncardiac surgery. She has stable angina with no active symptoms at this time. -She has chronic diastolic heart failure for which she is on Lasix. Well compensated at this time. -Recently seen by her cardiologist and has been doing well. -Last echocardiogram from last year showing tricuspid valve insufficiency but diastolic dysfunction and EF of 55% -Continue all her cardiac medications except aspirin and Plavix prior to surgery. - On Pain medications. physical therapy and DVT prophylaxis to be started after surgery -Appreciate orthopedics consult  #2 end-stage renal disease on hemodialysis-on Monday Wednesday Friday dialysis. -Has left forearm AV fistula. Nephrology consulted for the same. -Discontinue her potassium supplements  #3 history of atrial fibrillation-rate controlled at this time.  Continue Coreg. Also on amiodarone. -Status post ICD and pacemaker for history of tachyarrhythmias - Not on chronic anticoagulation discussed by cardiology. Only on aspirin and Plavix as outpatient which are held for surgery at this time  #4 chronic diastolic CHF-well compensated at this time. Continue Lasix and also dialysis per schedule -Continue cardiac medications including  losartan, Imdur, hydralazine and Coreg and statin.  #5 hypertension-on Norvasc, Coreg, clonidine, hydralazine, Lasix, Imdur and losartan  #6 DVT prophylaxis-will be started after surgery  Physical therapy consult after surgery.    All the records are reviewed and case discussed with Care Management/Social Workerr. Management plans discussed with the patient, family and they are in agreement.  CODE STATUS: Full code  TOTAL TIME TAKING CARE OF THIS PATIENT: 38 minutes.   POSSIBLE D/C IN 2-3 DAYS, DEPENDING ON CLINICAL CONDITION.   Gladstone Lighter M.D on 05/14/2016 at 8:54 AM  Between 7am to 6pm - Pager - [phone]  After 6pm go to [email or web address] - password [email or web address] [facility]  Office  [phone]  CC: Primary care physician; Madelyn Brunner, MD

## 2016-05-14 NOTE — H&P (Signed)
[facility] Consultation     ASSESSMENT/PLAN   A 82-year-old female status post right intertrochanteric fracture, right hip pinning, with postoperative respiratory failure with reintubation, postoperative hypotension, history of pulmonary hypertension.  PULMONARY A: Acute respiratory failure, possibly secondary to atelectasis, as well as pulmonary hypertension and volume overload P:   -Continue hemodialysis per nephrology. -Maintain on ventilator support tonight, we will reevaluate for possible extubation in the a.m. -We'll start IV phenylephrine titrated as needed for hypotension, likely secondary to postoperative period  CARDIOVASCULAR A: Postoperative hypotension. -Coronary artery disease. P:  -Titrate phenylephrine as needed.  RENAL A:  End-stage renal disease on hemodialysis. P:   Nephrology consulted.  GASTROINTESTINAL A:  - P:   -  HEMATOLOGIC A:  - P:  -  INFECTIOUS A:  -- P:    Micro/culture results:  BCx2 -- UC -- Sputum--  Antibiotics:   ENDOCRINE A:  --   P:   --  NEUROLOGIC A:  -- P:   RASS goal: -- --   MAJOR EVENTS/TEST RESULTS:   Best Practices  DVT Prophylaxis: SubQ heparin.  GI Prophylaxis: --   ---------------------------------------  ---------------------------------------   Name: BRIENA MALTBIE MRN: [id number] DOB: 02-Nov-1931    ADMISSION DATE:  05/13/2016  CHIEF COMPLAINT:  Dyspnea.    HISTORY OF PRESENT ILLNESS:    The patient is an 82 year old female status post right femur intercurrent trochanteric fracture. She has a history of arrhythmia, status post pacemaker placement, CAD, end-stage renal disease on hemodialysis, essential hypertension. The patient underwent pin today, 11/25. She was previously on aspirin and Plavix, which were held. Previous echocardiogram in 10/08/14, EF 55%, severe tricuspid valve insufficiency with moderate LV enlargement, appears consistent with pulmonary  hypertension.  The patient's procedure was uneventful, however, upon extubation, the patient had difficulty breathing with small breaths. Positive airway pressure breathing device were not available in the PACU area. Therefore, the patient had to be acutely intubated, she is now being transferred to the intensive care unit for further monitoring. The patient was hypotensive in the PACU area, therefore she was given intermittent dosing of phenylephrine IV push.  Review of lab studies is unremarkable for any acute changes. Review of chest x-ray images reviewed, shows right sided atelectasis. Patient is currently intubated on a ventilator, she is awake and follows some basic commands, she does not appear to be uncomfortable.    PAST MEDICAL HISTORY :  Past Medical History:  Diagnosis Date  . Arrhythmia   . Cancer ([place])    breast ca - left, kidney  . Dialysis AV fistula infection ([place])    Left arm Mon, Wed, Fri dialysis  . Hypertension   . MI, old   . Renal insufficiency    dialysis   Past Surgical History:  Procedure Laterality Date  . APPENDECTOMY    . AV FISTULA PLACEMENT Left   . CHOLECYSTECTOMY    . HIP FRACTURE SURGERY Left   . ICD LEAD REMOVAL N/A 11/04/2014   Procedure: ICD LEAD REMOVAL;  Surgeon: Isaias Cowman, MD;  Location: [facility] ORS;  Service: Cardiovascular;  Laterality: N/A;  . INSERT / REPLACE / REMOVE PACEMAKER    . kidney tumor Right    Prior to Admission medications   Medication Sig Start Date End Date Taking? Authorizing Provider  anastrozole (ARIMIDEX) 1 MG tablet Take 1 mg by mouth daily.   Yes Historical Provider, MD  aspirin EC 81 MG tablet Take 81 mg by mouth daily.   Yes  Historical Provider, MD  Azilsartan Medoxomil 80 MG TABS Take 1 tablet by mouth daily.   Yes Historical Provider, MD  carvedilol (COREG) 6.25 MG tablet TAKE ONE TABLET BY MOUTH TWICE DAILY 07/13/11  Yes Jackolyn Confer, MD  clopidogrel (PLAVIX) 75 MG tablet Take 75 mg by mouth daily.    Yes Historical Provider, MD  esomeprazole (NEXIUM) 20 MG capsule Take 20 mg by mouth daily at 12 noon.   Yes Historical Provider, MD  furosemide (LASIX) 80 MG tablet TAKE ONE TABLET BY MOUTH EVERY DAY 12/04/11  Yes Jackolyn Confer, MD  hydrALAZINE (APRESOLINE) 100 MG tablet TAKE ONE TABLET BY MOUTH THREE TIMES DAILY Patient taking differently: TAKE ONE TABLET BY MOUTH TWO TIMES DAILY 07/17/12  Yes Jackolyn Confer, MD  isosorbide mononitrate (IMDUR) 30 MG 24 hr tablet Take 30 mg by mouth daily.   Yes Historical Provider, MD  levothyroxine (SYNTHROID, LEVOTHROID) 100 MCG tablet Take 100 mcg by mouth daily before breakfast.   Yes Historical Provider, MD  lovastatin (MEVACOR) 40 MG tablet TAKE ONE TABLET BY MOUTH AT BEDTIME 05/29/11  Yes Jackolyn Confer, MD  mirtazapine (REMERON) 15 MG tablet Take 15 mg by mouth at bedtime.   Yes Historical Provider, MD  multivitamin (RENA-VIT) TABS tablet Take 1 tablet by mouth daily. 0.8mg    Yes Historical Provider, MD  PACERONE 200 MG tablet TAKE ONE TABLET BY MOUTH EVERY DAY 05/29/11  Yes Jackolyn Confer, MD  SENSIPAR 30 MG tablet TAKE ONE TABLET BY MOUTH AT DINNER. Patient taking differently: TAKE ONE TABLET BY MOUTH ON DIALYSIS DAYS. 02/27/11  Yes Jackolyn Confer, MD  sevelamer carbonate (RENVELA) 800 MG tablet Take 800 mg by mouth 3 (three) times daily with meals.   Yes Historical Provider, MD  diphenhydrAMINE (BENADRYL) 25 MG tablet Take 25 mg by mouth every 6 (six) hours as needed.    Historical Provider, MD   Allergies  Allergen Reactions  . Penicillins Itching  . Ivp Dye [Iodinated Diagnostic Agents] Rash  . Shellfish Allergy Rash    FAMILY HISTORY:  No family history on file. SOCIAL HISTORY:  reports that she quit smoking about 18 months ago. She uses smokeless tobacco. She reports that she does not drink alcohol or use drugs.  REVIEW OF SYSTEMS:   Could not be obtained as patient is on the ventilator.  VITAL SIGNS: Temp:  [96.1 F (35.6  C)-98.9 F (37.2 C)] 97 F (36.1 C) (11/25 1510) Pulse Rate:  [58-62] 60 (11/25 0750) Resp:  [16-20] 16 (11/25 0750) BP: (121-154)/(45-82) 125/45 (11/25 0750) SpO2:  [91 %-99 %] 91 % (11/25 0750) FiO2 (%):  [35 %] 35 % (11/25 1226) Weight:  [54.4 kg (120 lb)-58.4 kg (128 lb 11.2 oz)] 58.1 kg (128 lb) (11/25 0655) HEMODYNAMICS:   VENTILATOR SETTINGS: Vent Mode: CPAP;PSV FiO2 (%):  [35 %] 35 % INTAKE / OUTPUT:  Intake/Output Summary (Last 24 hours) at 05/14/16 1525 Last data filed at 05/14/16 1400  Gross per 24 hour  Intake          1849.17 ml  Output               25 ml  Net          1824.17 ml    Physical Examination:   VS: BP (!) 125/45 (BP Location: Left Leg)   Pulse 60   Temp 97 F (36.1 C)   Resp 16   Ht 5\' 7"  (1.702 m)   Wt 58.1  kg (128 lb)   SpO2 91%   BMI 20.05 kg/m   General Appearance: No distress  Neuro:without focal findings, mental status, speech normal,. HEENT: PERRLA, EOM intact, no ptosis, no other lesions noticed;  Pulmonary: normal breath sounds., diaphragmatic excursion normal. CardiovascularNormal S1,S2.  No m/r/g.    Abdomen: Benign, Soft, non-tender, No masses, hepatosplenomegaly, No lymphadenopathy Renal:  No costovertebral tenderness  GU:  Not performed at this time. Endoc: No evident thyromegaly, no signs of acromegaly. Skin:   warm, no rashes, no ecchymosis  Extremities: normal, no cyanosis, clubbing, no edema, warm with normal capillary refill.    LABS: Reviewed   LABORATORY PANEL:   CBC  Recent Labs Lab 05/14/16 0648  WBC 5.1  HGB 12.4  HCT 36.5  PLT 106*    Chemistries   Recent Labs Lab 05/13/16 2116 05/14/16 0648  NA 131* 132*  K 3.6 4.5  CL 92* 95*  CO2 26 27  GLUCOSE 130* 85  BUN 34* 35*  CREATININE 5.27* 5.72*  CALCIUM 8.6* 8.4*  AST 106*  --   ALT 35  --   ALKPHOS 211*  --   BILITOT 1.2  --     No results for input(s): GLUCAP in the last 168 hours. No results for input(s): PHART, PCO2ART, PO2ART  in the last 168 hours.  Recent Labs Lab 05/13/16 1736 05/13/16 2116  AST 37 106*  ALT 19 35  ALKPHOS 183* 211*  BILITOT <0.1* 1.2  ALBUMIN 3.1* 3.3*    Cardiac Enzymes  Recent Labs Lab 05/13/16 1736  TROPONINI <0.03    RADIOLOGY:  Ct Head Wo Contrast  Result Date: 05/13/2016 CLINICAL DATA:  Fall with head and right hip injury 2-3 hours prior. EXAM: CT HEAD WITHOUT CONTRAST CT CERVICAL SPINE WITHOUT CONTRAST TECHNIQUE: Multidetector CT imaging of the head and cervical spine was performed following the standard protocol without intravenous contrast. Multiplanar CT image reconstructions of the cervical spine were also generated. COMPARISON:  12/01/2012 head and cervical spine CT. FINDINGS: CT HEAD FINDINGS Brain: No evidence of parenchymal hemorrhage or extra-axial fluid collection. No mass lesion, mass effect, or midline shift. No CT evidence of acute infarction. Intracranial atherosclerosis. Nonspecific moderate subcortical and periventricular white matter hypodensity, most in keeping with chronic small vessel ischemic change. No ventriculomegaly. Vascular: No hyperdense vessel or unexpected calcification. Skull: No evidence of calvarial fracture. Sinuses/Orbits: The visualized paranasal sinuses are essentially clear. Other:  The mastoid air cells are unopacified. CT CERVICAL SPINE FINDINGS Alignment: Reversal of the normal cervical lordosis. No subluxation. Dens is well positioned between the lateral masses of C1. Skull base and vertebrae: No acute fracture. No primary bone lesion or focal pathologic process. Soft tissues and spinal canal: No prevertebral fluid or swelling. No visible canal hematoma. Disc levels: Mild-to-moderate spondylosis in the lower cervical spine, most prominent at C6-7, not appreciably changed. Mild bilateral facet arthropathy. No significant bony foraminal stenosis. Upper chest: Partially visualized irregular apical left upper lobe pulmonary nodule measuring at least  2.1 cm (series 6/ image 75), not appreciated on 10/23/2014 PET-CT study. Apical 0.7 cm right upper lobe pulmonary nodule (series 6/ image 67), not definitely seen on 10/23/2014 PET-CT. Centrilobular emphysema at the lung apices. Partially visualized left subclavian pacer leads. Other: Visualized mastoid air cells appear clear. No discrete thyroid nodules. No pathologically enlarged cervical nodes. Extracranial right carotid artery stent. IMPRESSION: 1. No evidence of acute intracranial abnormality. No evidence of calvarial fracture. 2. Moderate chronic small vessel ischemia. 3. No cervical spine  fracture or subluxation. 4. Mild degenerative changes in the cervical spine as described . 5. **An incidental finding of potential clinical significance has been found. Partially visualized irregular apical left upper lobe pulmonary nodule measuring at least 2.1 cm. Apical 0.7 cm right upper lobe pulmonary nodule. These nodules were not definitely seen on 10/23/2014 PET-CT study, and primary bronchogenic carcinoma cannot be excluded. Dedicated chest CT is recommended for further evaluation.** Electronically Signed   By: Ilona Sorrel M.D.   On: 05/13/2016 18:18   Ct Cervical Spine Wo Contrast  Result Date: 05/13/2016 CLINICAL DATA:  Fall with head and right hip injury 2-3 hours prior. EXAM: CT HEAD WITHOUT CONTRAST CT CERVICAL SPINE WITHOUT CONTRAST TECHNIQUE: Multidetector CT imaging of the head and cervical spine was performed following the standard protocol without intravenous contrast. Multiplanar CT image reconstructions of the cervical spine were also generated. COMPARISON:  12/01/2012 head and cervical spine CT. FINDINGS: CT HEAD FINDINGS Brain: No evidence of parenchymal hemorrhage or extra-axial fluid collection. No mass lesion, mass effect, or midline shift. No CT evidence of acute infarction. Intracranial atherosclerosis. Nonspecific moderate subcortical and periventricular white matter hypodensity, most in  keeping with chronic small vessel ischemic change. No ventriculomegaly. Vascular: No hyperdense vessel or unexpected calcification. Skull: No evidence of calvarial fracture. Sinuses/Orbits: The visualized paranasal sinuses are essentially clear. Other:  The mastoid air cells are unopacified. CT CERVICAL SPINE FINDINGS Alignment: Reversal of the normal cervical lordosis. No subluxation. Dens is well positioned between the lateral masses of C1. Skull base and vertebrae: No acute fracture. No primary bone lesion or focal pathologic process. Soft tissues and spinal canal: No prevertebral fluid or swelling. No visible canal hematoma. Disc levels: Mild-to-moderate spondylosis in the lower cervical spine, most prominent at C6-7, not appreciably changed. Mild bilateral facet arthropathy. No significant bony foraminal stenosis. Upper chest: Partially visualized irregular apical left upper lobe pulmonary nodule measuring at least 2.1 cm (series 6/ image 75), not appreciated on 10/23/2014 PET-CT study. Apical 0.7 cm right upper lobe pulmonary nodule (series 6/ image 67), not definitely seen on 10/23/2014 PET-CT. Centrilobular emphysema at the lung apices. Partially visualized left subclavian pacer leads. Other: Visualized mastoid air cells appear clear. No discrete thyroid nodules. No pathologically enlarged cervical nodes. Extracranial right carotid artery stent. IMPRESSION: 1. No evidence of acute intracranial abnormality. No evidence of calvarial fracture. 2. Moderate chronic small vessel ischemia. 3. No cervical spine fracture or subluxation. 4. Mild degenerative changes in the cervical spine as described . 5. **An incidental finding of potential clinical significance has been found. Partially visualized irregular apical left upper lobe pulmonary nodule measuring at least 2.1 cm. Apical 0.7 cm right upper lobe pulmonary nodule. These nodules were not definitely seen on 10/23/2014 PET-CT study, and primary bronchogenic  carcinoma cannot be excluded. Dedicated chest CT is recommended for further evaluation.** Electronically Signed   By: Ilona Sorrel M.D.   On: 05/13/2016 18:18   Dg Chest Port 1 View  Result Date: 05/14/2016 CLINICAL DATA:  Respiratory distress, status post intubation EXAM: PORTABLE CHEST 1 VIEW COMPARISON:  05/13/2016 FINDINGS: Cardiac shadow is again enlarged. Defibrillator is again seen. Endotracheal tube is now seen in satisfactory position at the level of the aortic knob. Stenting in the left innominate vein extending into the SVC is seen and stable. Mild bibasilar atelectasis is noted. No effusion or pneumothorax is noted. IMPRESSION: Endotracheal tube in satisfactory position. Bibasilar atelectasis. Electronically Signed   By: Inez Catalina M.D.   On: 05/14/2016 14:20  Dg Chest Portable 1 View  Result Date: 05/13/2016 CLINICAL DATA:  Fall. EXAM: PORTABLE CHEST 1 VIEW COMPARISON:  Radiographs of April 21, 2016. FINDINGS: Stable cardiomegaly. Atherosclerosis of thoracic aorta is noted. Left-sided pacemaker is unchanged in position. No pneumothorax or pleural effusion is noted. No acute pulmonary disease is noted. Bony thorax is unremarkable. IMPRESSION: Aortic atherosclerosis.  No acute cardiopulmonary abnormality seen. Electronically Signed   By: Marijo Conception, M.D.   On: 05/13/2016 18:34   Dg Hip Operative Unilat W Or W/o Pelvis Right  Result Date: 05/14/2016 CLINICAL DATA:  Right hip fracture EXAM: OPERATIVE RIGHT HIP WITH PELVIS COMPARISON:  05/13/2016 FLUOROSCOPY TIME:  Radiation Exposure Index (as provided by the fluoroscopic device): Not available If the device does not provide the exposure index: Fluoroscopy Time:  47 seconds Number of Acquired Images:  3 FINDINGS: Femoral medullary rod with fixation screw proximally is noted. The fracture fragments are in near anatomic alignment. IMPRESSION: Status post ORIF of proximal right femoral fracture. Electronically Signed   By: Inez Catalina  M.D.   On: 05/14/2016 12:00   Dg Hip Unilat W Or Wo Pelvis 2-3 Views Right  Result Date: 05/13/2016 CLINICAL DATA:  Right hip pain after fall. EXAM: DG HIP (WITH OR WITHOUT PELVIS) 2-3V RIGHT COMPARISON:  None. FINDINGS: Moderately displaced fracture is seen involving intertrochanteric region of proximal right femur. Vascular calcifications are noted IMPRESSION: Moderately displaced intertrochanteric fracture of proximal right femur. Electronically Signed   By: Marijo Conception, M.D.   On: 05/13/2016 18:32       --Marda Stalker, MD.  Board Certified in Internal Medicine, Pulmonary Medicine, [place], and Sleep Medicine.  ICU Pager [phone] [facility] Office Number: [phone]  Patricia Pesa, M.D.  Vilinda Boehringer, M.D.  Merton Border, M.D   05/14/2016, 3:25 PM  [place].  I have personally obtained a history, examined the patient, evaluated laboratory and imaging results, formulated the assessment and plan and placed orders. The Patient requires high complexity decision making for assessment and support, frequent evaluation and titration of therapies, application of advanced monitoring technologies and extensive interpretation of multiple databases. The patient has critical illness that could lead imminently to failure of 1 or more organ systems and requires the highest level of physician preparedness to intervene.  Critical Care Time devoted to patient care services described in this note is 45 minutes and is exclusive of time spent in procedures.

## 2016-05-14 NOTE — Transfer of Care (Signed)
Immediate Anesthesia Transfer of Care Note  Patient: Patricia Woodard  Procedure(s) Performed: Procedure(s): OPEN REDUCTION INTERNAL FIXATION HIP (Right)  Patient Location: PACU  Anesthesia Type:General  Level of Consciousness: awake, alert , oriented and patient cooperative  Airway & Oxygen Therapy: Patient Spontanous Breathing and Patient placed on Ventilator (see vital sign flow sheet for setting)  Post-op Assessment: Report given to RN and Post -op Vital signs reviewed and stable  Post vital signs: Reviewed and stable  Last Vitals:  Vitals:   05/14/16 0409 05/14/16 0750  BP: (!) 121/49 (!) 125/45  Pulse: 60 60  Resp: 18 16  Temp: 36.9 C 37.2 C    Last Pain:  Vitals:   05/14/16 0822  TempSrc:   PainSc: 9          Complications: respiratory complications

## 2016-05-14 NOTE — H&P (Signed)
THE PATIENT WAS SEEN PRIOR TO SURGERY TODAY.  HISTORY, ALLERGIES, HOME MEDICATIONS AND OPERATIVE PROCEDURE WERE REVIEWED. RISKS AND BENEFITS OF SURGERY DISCUSSED WITH PATIENT AGAIN.  NO CHANGES FROM INITIAL HISTORY AND PHYSICAL NOTED.    

## 2016-05-14 NOTE — Progress Notes (Signed)
Trial extubation attempted after achieving adequate TV with 5/5 pressure support and following commands. After extubation patient had weak cough and respirations shallow. Unable to maintain sats without support. Bag mask ventilation support performed and patient reintubated. Will consult intensive care physician for admission to ICU. Concern for residual neuromuscular blockade in the setting of renal failure despite multiple doses of reversal. CXR obtained after reintubation with clear lung fields.

## 2016-05-14 NOTE — OR Nursing (Signed)
Moving to ICU

## 2016-05-14 NOTE — Consult Note (Signed)
[facility] Cardiology  CARDIOLOGY CONSULT NOTE  Patient ID: Patricia Woodard MRN: [id number] DOB/AGE: 03/18/1932 82 y.o.  Admit date: 05/13/2016 Referring Physician Tressia Miners Primary Physician [facility] Primary Cardiologist Nehemiah Massed Reason for Consultation preoperative  Cardiovascular evaluation  HPI: 82 year old female referred for preoperative cardiovascular evaluation. The patient was in health until yesterday when she tripped and fell and fractured her right hip, awaiting surgery.the patient has known chronic atrial fibrillation, on aspirin.-year-old for stroke prevention and amiodarone for rhythm control. She has known sick sinus syndrome, status post dual-chamber pacemaker 2016 .The patient also has end-stage renal disease on chronic hemodialysis. The patient denies chest pain, shortness of breath, palpitations, heart racing, or periphera  Review of systems complete and found to be negative unless listed above     Past Medical History:  Diagnosis Date  . Arrhythmia   . Cancer ([place])    breast ca - left, kidney  . Dialysis AV fistula infection ([place])    Left arm Mon, Wed, Fri dialysis  . Hypertension   . MI, old   . Renal insufficiency    dialysis    Past Surgical History:  Procedure Laterality Date  . APPENDECTOMY    . AV FISTULA PLACEMENT Left   . CHOLECYSTECTOMY    . HIP FRACTURE SURGERY Left   . ICD LEAD REMOVAL N/A 11/04/2014   Procedure: ICD LEAD REMOVAL;  Surgeon: Isaias Cowman, MD;  Location: [facility] ORS;  Service: Cardiovascular;  Laterality: N/A;  . INSERT / REPLACE / REMOVE PACEMAKER    . kidney tumor Right     Prescriptions Prior to Admission  Medication Sig Dispense Refill Last Dose  . anastrozole (ARIMIDEX) 1 MG tablet Take 1 mg by mouth daily.   05/12/2016 at Unknown time  . aspirin EC 81 MG tablet Take 81 mg by mouth daily.   05/12/2016 at Unknown time  . Azilsartan Medoxomil 80 MG TABS Take 1 tablet by mouth daily.   05/12/2016 at Unknown time  . carvedilol  (COREG) 6.25 MG tablet TAKE ONE TABLET BY MOUTH TWICE DAILY 180 tablet 3 05/12/2016 at Unknown time  . clopidogrel (PLAVIX) 75 MG tablet Take 75 mg by mouth daily.   05/12/2016 at Unknown time  . esomeprazole (NEXIUM) 20 MG capsule Take 20 mg by mouth daily at 12 noon.   05/12/2016 at Unknown time  . furosemide (LASIX) 80 MG tablet TAKE ONE TABLET BY MOUTH EVERY DAY 90 tablet 3 05/12/2016 at Unknown time  . hydrALAZINE (APRESOLINE) 100 MG tablet TAKE ONE TABLET BY MOUTH THREE TIMES DAILY (Patient taking differently: TAKE ONE TABLET BY MOUTH TWO TIMES DAILY) 270 tablet 2 05/12/2016 at Unknown time  . isosorbide mononitrate (IMDUR) 30 MG 24 hr tablet Take 30 mg by mouth daily.   05/12/2016 at Unknown time  . levothyroxine (SYNTHROID, LEVOTHROID) 100 MCG tablet Take 100 mcg by mouth daily before breakfast.   05/12/2016 at Unknown time  . lovastatin (MEVACOR) 40 MG tablet TAKE ONE TABLET BY MOUTH AT BEDTIME 90 tablet 3 05/12/2016 at Unknown time  . mirtazapine (REMERON) 15 MG tablet Take 15 mg by mouth at bedtime.   05/12/2016 at Unknown time  . multivitamin (RENA-VIT) TABS tablet Take 1 tablet by mouth daily. 0.8mg    05/12/2016 at Unknown time  . PACERONE 200 MG tablet TAKE ONE TABLET BY MOUTH EVERY DAY 90 each 0 05/12/2016 at Unknown time  . SENSIPAR 30 MG tablet TAKE ONE TABLET BY MOUTH AT DINNER. (Patient taking differently: TAKE ONE TABLET BY MOUTH ON  DIALYSIS DAYS.) 30 each 1 05/12/2016 at Unknown time  . sevelamer carbonate (RENVELA) 800 MG tablet Take 800 mg by mouth 3 (three) times daily with meals.   05/12/2016 at Unknown time  . diphenhydrAMINE (BENADRYL) 25 MG tablet Take 25 mg by mouth every 6 (six) hours as needed.   PRN at PRN   Social History   Social History  . Marital status: Married    Spouse name: N/A  . Number of children: N/A  . Years of education: N/A   Occupational History  . Not on file.   Social History Main Topics  . Smoking status: Former Smoker    Quit date:  10/17/2014  . Smokeless tobacco: Current User  . Alcohol use No  . Drug use: No  . Sexual activity: Not on file   Other Topics Concern  . Not on file   Social History Narrative  . No narrative on file    No family history on file.    Review of systems complete and found to be negative unless listed above      PHYSICAL EXAM  General: Well developed, well nourished, in no acute distress HEENT:  Normocephalic and atramatic Neck:  No JVD.  Lungs: Clear bilaterally to auscultation and percussion. Heart: HRRR . Normal S1 and S2 without gallops or murmurs.  Abdomen: Bowel sounds are positive, abdomen soft and non-tender  Msk:  Back normal, normal gait. Normal strength and tone for age. Extremities: No clubbing, cyanosis or edema.   Neuro: Alert and oriented X 3. Psych:  Good affect, responds appropriately  Labs:   Lab Results  Component Value Date   WBC 5.1 05/14/2016   HGB 12.4 05/14/2016   HCT 36.5 05/14/2016   MCV 103.6 (H) 05/14/2016   PLT 106 (L) 05/14/2016    Recent Labs Lab 05/13/16 2116 05/14/16 0648  NA 131* 132*  K 3.6 4.5  CL 92* 95*  CO2 26 27  BUN 34* 35*  CREATININE 5.27* 5.72*  CALCIUM 8.6* 8.4*  PROT 7.9  --   BILITOT 1.2  --   ALKPHOS 211*  --   ALT 35  --   AST 106*  --   GLUCOSE 130* 85   Lab Results  Component Value Date   CKTOTAL 56 08/22/2012   CKMB 2.7 08/22/2012   TROPONINI <0.03 05/13/2016    Lab Results  Component Value Date   CHOL 97 08/06/2012   Lab Results  Component Value Date   HDL 26 (L) 08/06/2012   Lab Results  Component Value Date   LDLCALC 56 08/06/2012   Lab Results  Component Value Date   TRIG 74 08/06/2012   No results found for: CHOLHDL No results found for: LDLDIRECT    Radiology: Dg Chest 2 View  Result Date: 04/21/2016 CLINICAL DATA:  Crackling in lungs when at dialysis today, smoker, cough, cold mile EXAM: CHEST  2 VIEW COMPARISON:  10/30/2014 FINDINGS: LEFT subclavian AICD leads project at  RIGHT atrium and RIGHT ventricle. Brachiocephalic vein stent again noted. Enlargement of cardiac silhouette with minimal pulmonary vascular congestion. Atherosclerotic calcification and tortuosity of thoracic aorta. Chronic interstitial changes with scarring at RIGHT base. Question subtle nodular density in the mid RIGHT lung likely RIGHT upper lobe. Minimal atelectasis LEFT base. No definite acute infiltrate, pleural effusion or pneumothorax. Diffuse osseous demineralization. IMPRESSION: Enlargement of cardiac silhouette post AICD. Aortic atherosclerosis. Emphysematous and chronic interstitial changes with RIGHT basilar scarring and LEFT basilar atelectasis. **An incidental finding of potential  clinical significance has been found. Question subtle nodular density RIGHT upper lobe adjacent to minor fissure; noncontrast CT chest recommended to exclude pulmonary nodule.** Electronically Signed   By: Lavonia Dana M.D.   On: 04/21/2016 14:15   Ct Head Wo Contrast  Result Date: 05/13/2016 CLINICAL DATA:  Fall with head and right hip injury 2-3 hours prior. EXAM: CT HEAD WITHOUT CONTRAST CT CERVICAL SPINE WITHOUT CONTRAST TECHNIQUE: Multidetector CT imaging of the head and cervical spine was performed following the standard protocol without intravenous contrast. Multiplanar CT image reconstructions of the cervical spine were also generated. COMPARISON:  12/01/2012 head and cervical spine CT. FINDINGS: CT HEAD FINDINGS Brain: No evidence of parenchymal hemorrhage or extra-axial fluid collection. No mass lesion, mass effect, or midline shift. No CT evidence of acute infarction. Intracranial atherosclerosis. Nonspecific moderate subcortical and periventricular white matter hypodensity, most in keeping with chronic small vessel ischemic change. No ventriculomegaly. Vascular: No hyperdense vessel or unexpected calcification. Skull: No evidence of calvarial fracture. Sinuses/Orbits: The visualized paranasal sinuses are  essentially clear. Other:  The mastoid air cells are unopacified. CT CERVICAL SPINE FINDINGS Alignment: Reversal of the normal cervical lordosis. No subluxation. Dens is well positioned between the lateral masses of C1. Skull base and vertebrae: No acute fracture. No primary bone lesion or focal pathologic process. Soft tissues and spinal canal: No prevertebral fluid or swelling. No visible canal hematoma. Disc levels: Mild-to-moderate spondylosis in the lower cervical spine, most prominent at C6-7, not appreciably changed. Mild bilateral facet arthropathy. No significant bony foraminal stenosis. Upper chest: Partially visualized irregular apical left upper lobe pulmonary nodule measuring at least 2.1 cm (series 6/ image 75), not appreciated on 10/23/2014 PET-CT study. Apical 0.7 cm right upper lobe pulmonary nodule (series 6/ image 67), not definitely seen on 10/23/2014 PET-CT. Centrilobular emphysema at the lung apices. Partially visualized left subclavian pacer leads. Other: Visualized mastoid air cells appear clear. No discrete thyroid nodules. No pathologically enlarged cervical nodes. Extracranial right carotid artery stent. IMPRESSION: 1. No evidence of acute intracranial abnormality. No evidence of calvarial fracture. 2. Moderate chronic small vessel ischemia. 3. No cervical spine fracture or subluxation. 4. Mild degenerative changes in the cervical spine as described . 5. **An incidental finding of potential clinical significance has been found. Partially visualized irregular apical left upper lobe pulmonary nodule measuring at least 2.1 cm. Apical 0.7 cm right upper lobe pulmonary nodule. These nodules were not definitely seen on 10/23/2014 PET-CT study, and primary bronchogenic carcinoma cannot be excluded. Dedicated chest CT is recommended for further evaluation.** Electronically Signed   By: Ilona Sorrel M.D.   On: 05/13/2016 18:18   Ct Cervical Spine Wo Contrast  Result Date: 05/13/2016 CLINICAL  DATA:  Fall with head and right hip injury 2-3 hours prior. EXAM: CT HEAD WITHOUT CONTRAST CT CERVICAL SPINE WITHOUT CONTRAST TECHNIQUE: Multidetector CT imaging of the head and cervical spine was performed following the standard protocol without intravenous contrast. Multiplanar CT image reconstructions of the cervical spine were also generated. COMPARISON:  12/01/2012 head and cervical spine CT. FINDINGS: CT HEAD FINDINGS Brain: No evidence of parenchymal hemorrhage or extra-axial fluid collection. No mass lesion, mass effect, or midline shift. No CT evidence of acute infarction. Intracranial atherosclerosis. Nonspecific moderate subcortical and periventricular white matter hypodensity, most in keeping with chronic small vessel ischemic change. No ventriculomegaly. Vascular: No hyperdense vessel or unexpected calcification. Skull: No evidence of calvarial fracture. Sinuses/Orbits: The visualized paranasal sinuses are essentially clear. Other:  The mastoid air cells  are unopacified. CT CERVICAL SPINE FINDINGS Alignment: Reversal of the normal cervical lordosis. No subluxation. Dens is well positioned between the lateral masses of C1. Skull base and vertebrae: No acute fracture. No primary bone lesion or focal pathologic process. Soft tissues and spinal canal: No prevertebral fluid or swelling. No visible canal hematoma. Disc levels: Mild-to-moderate spondylosis in the lower cervical spine, most prominent at C6-7, not appreciably changed. Mild bilateral facet arthropathy. No significant bony foraminal stenosis. Upper chest: Partially visualized irregular apical left upper lobe pulmonary nodule measuring at least 2.1 cm (series 6/ image 75), not appreciated on 10/23/2014 PET-CT study. Apical 0.7 cm right upper lobe pulmonary nodule (series 6/ image 67), not definitely seen on 10/23/2014 PET-CT. Centrilobular emphysema at the lung apices. Partially visualized left subclavian pacer leads. Other: Visualized mastoid air  cells appear clear. No discrete thyroid nodules. No pathologically enlarged cervical nodes. Extracranial right carotid artery stent. IMPRESSION: 1. No evidence of acute intracranial abnormality. No evidence of calvarial fracture. 2. Moderate chronic small vessel ischemia. 3. No cervical spine fracture or subluxation. 4. Mild degenerative changes in the cervical spine as described . 5. **An incidental finding of potential clinical significance has been found. Partially visualized irregular apical left upper lobe pulmonary nodule measuring at least 2.1 cm. Apical 0.7 cm right upper lobe pulmonary nodule. These nodules were not definitely seen on 10/23/2014 PET-CT study, and primary bronchogenic carcinoma cannot be excluded. Dedicated chest CT is recommended for further evaluation.** Electronically Signed   By: Ilona Sorrel M.D.   On: 05/13/2016 18:18   Dg Chest Portable 1 View  Result Date: 05/13/2016 CLINICAL DATA:  Fall. EXAM: PORTABLE CHEST 1 VIEW COMPARISON:  Radiographs of April 21, 2016. FINDINGS: Stable cardiomegaly. Atherosclerosis of thoracic aorta is noted. Left-sided pacemaker is unchanged in position. No pneumothorax or pleural effusion is noted. No acute pulmonary disease is noted. Bony thorax is unremarkable. IMPRESSION: Aortic atherosclerosis.  No acute cardiopulmonary abnormality seen. Electronically Signed   By: Marijo Conception, M.D.   On: 05/13/2016 18:34   Dg Hip Unilat W Or Wo Pelvis 2-3 Views Right  Result Date: 05/13/2016 CLINICAL DATA:  Right hip pain after fall. EXAM: DG HIP (WITH OR WITHOUT PELVIS) 2-3V RIGHT COMPARISON:  None. FINDINGS: Moderately displaced fracture is seen involving intertrochanteric region of proximal right femur. Vascular calcifications are noted IMPRESSION: Moderately displaced intertrochanteric fracture of proximal right femur. Electronically Signed   By: Marijo Conception, M.D.   On: 05/13/2016 18:32    EKG: atrial paced  rhythm  ASSESSMENT AND PLAN:    1. Status post right hip fracture, awaiting surgery. The patient denies chest pain or shortness of breath. She has known chronic atrial fibrillation, on amiodarone for rhythm control. ECG reveals atrial paced rhythm. The patient should be acceptable risk for surgery.  Recommendations  1. Continue current medications 2. Proceed  with hip surgery as planned 3. No further cardiac diagnostics at this time  Signed: Isaias Cowman MD,PhD, [facility] 05/14/2016, 9:48 AM

## 2016-05-14 NOTE — Consult Note (Signed)
ORTHOPAEDIC CONSULTATION  REQUESTING PHYSICIAN: Gladstone Lighter, MD  Chief Complaint: Right hip pain  HPI: Patricia Woodard is a 82 y.o. female who complains of  right hip pain due to a fall at home yesterday.  The patient returned from dialysis and was nauseated and fell.  She was brought to the emergency room where exam and x-rays reveal a intertrochanteric fracture of the right hip at the base of the neck.  She was admitted for medical evaluation and surgery.  I have spoken to the patient and 3 daughters about the injury.  She had a similar injury several years ago treated at [facility].  They understand the severity of the injury and the need for surgery.  The risks and benefits and postop protocol were discussed with them.  They understand that she is a very high risk patient due to her multiple foam multiple medical problems and including end-stage renal disease with dialysis, cardiac arrhythmias with pacemaker, hypertension, history of breast  cancer and previous heart attack.  They all wish to proceed with surgery today.  She has been cleared by the medical service and seen by the cardiologist as well.   Past Medical History:  Diagnosis Date  . Arrhythmia   . Cancer ([place])    breast ca - left, kidney  . Dialysis AV fistula infection ([place])    Left arm Mon, Wed, Fri dialysis  . Hypertension   . MI, old   . Renal insufficiency    dialysis   Past Surgical History:  Procedure Laterality Date  . APPENDECTOMY    . AV FISTULA PLACEMENT Left   . CHOLECYSTECTOMY    . HIP FRACTURE SURGERY Left   . ICD LEAD REMOVAL N/A 11/04/2014   Procedure: ICD LEAD REMOVAL;  Surgeon: Isaias Cowman, MD;  Location: [facility] ORS;  Service: Cardiovascular;  Laterality: N/A;  . INSERT / REPLACE / REMOVE PACEMAKER    . kidney tumor Right    Social History   Social History  . Marital status: Married    Spouse name: N/A  . Number of children: N/A  . Years of education: N/A   Social History Main Topics   . Smoking status: Former Smoker    Quit date: 10/17/2014  . Smokeless tobacco: Current User  . Alcohol use No  . Drug use: No  . Sexual activity: Not Asked   Other Topics Concern  . None   Social History Narrative  . None   No family history on file. Allergies  Allergen Reactions  . Penicillins Itching  . Ivp Dye [Iodinated Diagnostic Agents] Rash  . Shellfish Allergy Rash   Prior to Admission medications   Medication Sig Start Date End Date Taking? Authorizing Provider  anastrozole (ARIMIDEX) 1 MG tablet Take 1 mg by mouth daily.   Yes Historical Provider, MD  aspirin EC 81 MG tablet Take 81 mg by mouth daily.   Yes Historical Provider, MD  Azilsartan Medoxomil 80 MG TABS Take 1 tablet by mouth daily.   Yes Historical Provider, MD  carvedilol (COREG) 6.25 MG tablet TAKE ONE TABLET BY MOUTH TWICE DAILY 07/13/11  Yes Jackolyn Confer, MD  clopidogrel (PLAVIX) 75 MG tablet Take 75 mg by mouth daily.   Yes Historical Provider, MD  esomeprazole (NEXIUM) 20 MG capsule Take 20 mg by mouth daily at 12 noon.   Yes Historical Provider, MD  furosemide (LASIX) 80 MG tablet TAKE ONE TABLET BY MOUTH EVERY DAY 12/04/11  Yes Jackolyn Confer, MD  hydrALAZINE (APRESOLINE) 100 MG tablet TAKE ONE TABLET BY MOUTH THREE TIMES DAILY Patient taking differently: TAKE ONE TABLET BY MOUTH TWO TIMES DAILY 07/17/12  Yes Jackolyn Confer, MD  isosorbide mononitrate (IMDUR) 30 MG 24 hr tablet Take 30 mg by mouth daily.   Yes Historical Provider, MD  levothyroxine (SYNTHROID, LEVOTHROID) 100 MCG tablet Take 100 mcg by mouth daily before breakfast.   Yes Historical Provider, MD  lovastatin (MEVACOR) 40 MG tablet TAKE ONE TABLET BY MOUTH AT BEDTIME 05/29/11  Yes Jackolyn Confer, MD  mirtazapine (REMERON) 15 MG tablet Take 15 mg by mouth at bedtime.   Yes Historical Provider, MD  multivitamin (RENA-VIT) TABS tablet Take 1 tablet by mouth daily. 0.8mg    Yes Historical Provider, MD  PACERONE 200 MG tablet TAKE  ONE TABLET BY MOUTH EVERY DAY 05/29/11  Yes Jackolyn Confer, MD  SENSIPAR 30 MG tablet TAKE ONE TABLET BY MOUTH AT DINNER. Patient taking differently: TAKE ONE TABLET BY MOUTH ON DIALYSIS DAYS. 02/27/11  Yes Jackolyn Confer, MD  sevelamer carbonate (RENVELA) 800 MG tablet Take 800 mg by mouth 3 (three) times daily with meals.   Yes Historical Provider, MD  diphenhydrAMINE (BENADRYL) 25 MG tablet Take 25 mg by mouth every 6 (six) hours as needed.    Historical Provider, MD   Ct Head Wo Contrast  Result Date: 05/13/2016 CLINICAL DATA:  Fall with head and right hip injury 2-3 hours prior. EXAM: CT HEAD WITHOUT CONTRAST CT CERVICAL SPINE WITHOUT CONTRAST TECHNIQUE: Multidetector CT imaging of the head and cervical spine was performed following the standard protocol without intravenous contrast. Multiplanar CT image reconstructions of the cervical spine were also generated. COMPARISON:  12/01/2012 head and cervical spine CT. FINDINGS: CT HEAD FINDINGS Brain: No evidence of parenchymal hemorrhage or extra-axial fluid collection. No mass lesion, mass effect, or midline shift. No CT evidence of acute infarction. Intracranial atherosclerosis. Nonspecific moderate subcortical and periventricular white matter hypodensity, most in keeping with chronic small vessel ischemic change. No ventriculomegaly. Vascular: No hyperdense vessel or unexpected calcification. Skull: No evidence of calvarial fracture. Sinuses/Orbits: The visualized paranasal sinuses are essentially clear. Other:  The mastoid air cells are unopacified. CT CERVICAL SPINE FINDINGS Alignment: Reversal of the normal cervical lordosis. No subluxation. Dens is well positioned between the lateral masses of C1. Skull base and vertebrae: No acute fracture. No primary bone lesion or focal pathologic process. Soft tissues and spinal canal: No prevertebral fluid or swelling. No visible canal hematoma. Disc levels: Mild-to-moderate spondylosis in the lower cervical  spine, most prominent at C6-7, not appreciably changed. Mild bilateral facet arthropathy. No significant bony foraminal stenosis. Upper chest: Partially visualized irregular apical left upper lobe pulmonary nodule measuring at least 2.1 cm (series 6/ image 75), not appreciated on 10/23/2014 PET-CT study. Apical 0.7 cm right upper lobe pulmonary nodule (series 6/ image 67), not definitely seen on 10/23/2014 PET-CT. Centrilobular emphysema at the lung apices. Partially visualized left subclavian pacer leads. Other: Visualized mastoid air cells appear clear. No discrete thyroid nodules. No pathologically enlarged cervical nodes. Extracranial right carotid artery stent. IMPRESSION: 1. No evidence of acute intracranial abnormality. No evidence of calvarial fracture. 2. Moderate chronic small vessel ischemia. 3. No cervical spine fracture or subluxation. 4. Mild degenerative changes in the cervical spine as described . 5. **An incidental finding of potential clinical significance has been found. Partially visualized irregular apical left upper lobe pulmonary nodule measuring at least 2.1 cm. Apical 0.7 cm right upper lobe pulmonary  nodule. These nodules were not definitely seen on 10/23/2014 PET-CT study, and primary bronchogenic carcinoma cannot be excluded. Dedicated chest CT is recommended for further evaluation.** Electronically Signed   By: Ilona Sorrel M.D.   On: 05/13/2016 18:18   Ct Cervical Spine Wo Contrast  Result Date: 05/13/2016 CLINICAL DATA:  Fall with head and right hip injury 2-3 hours prior. EXAM: CT HEAD WITHOUT CONTRAST CT CERVICAL SPINE WITHOUT CONTRAST TECHNIQUE: Multidetector CT imaging of the head and cervical spine was performed following the standard protocol without intravenous contrast. Multiplanar CT image reconstructions of the cervical spine were also generated. COMPARISON:  12/01/2012 head and cervical spine CT. FINDINGS: CT HEAD FINDINGS Brain: No evidence of parenchymal hemorrhage or  extra-axial fluid collection. No mass lesion, mass effect, or midline shift. No CT evidence of acute infarction. Intracranial atherosclerosis. Nonspecific moderate subcortical and periventricular white matter hypodensity, most in keeping with chronic small vessel ischemic change. No ventriculomegaly. Vascular: No hyperdense vessel or unexpected calcification. Skull: No evidence of calvarial fracture. Sinuses/Orbits: The visualized paranasal sinuses are essentially clear. Other:  The mastoid air cells are unopacified. CT CERVICAL SPINE FINDINGS Alignment: Reversal of the normal cervical lordosis. No subluxation. Dens is well positioned between the lateral masses of C1. Skull base and vertebrae: No acute fracture. No primary bone lesion or focal pathologic process. Soft tissues and spinal canal: No prevertebral fluid or swelling. No visible canal hematoma. Disc levels: Mild-to-moderate spondylosis in the lower cervical spine, most prominent at C6-7, not appreciably changed. Mild bilateral facet arthropathy. No significant bony foraminal stenosis. Upper chest: Partially visualized irregular apical left upper lobe pulmonary nodule measuring at least 2.1 cm (series 6/ image 75), not appreciated on 10/23/2014 PET-CT study. Apical 0.7 cm right upper lobe pulmonary nodule (series 6/ image 67), not definitely seen on 10/23/2014 PET-CT. Centrilobular emphysema at the lung apices. Partially visualized left subclavian pacer leads. Other: Visualized mastoid air cells appear clear. No discrete thyroid nodules. No pathologically enlarged cervical nodes. Extracranial right carotid artery stent. IMPRESSION: 1. No evidence of acute intracranial abnormality. No evidence of calvarial fracture. 2. Moderate chronic small vessel ischemia. 3. No cervical spine fracture or subluxation. 4. Mild degenerative changes in the cervical spine as described . 5. **An incidental finding of potential clinical significance has been found. Partially  visualized irregular apical left upper lobe pulmonary nodule measuring at least 2.1 cm. Apical 0.7 cm right upper lobe pulmonary nodule. These nodules were not definitely seen on 10/23/2014 PET-CT study, and primary bronchogenic carcinoma cannot be excluded. Dedicated chest CT is recommended for further evaluation.** Electronically Signed   By: Ilona Sorrel M.D.   On: 05/13/2016 18:18   Dg Chest Portable 1 View  Result Date: 05/13/2016 CLINICAL DATA:  Fall. EXAM: PORTABLE CHEST 1 VIEW COMPARISON:  Radiographs of April 21, 2016. FINDINGS: Stable cardiomegaly. Atherosclerosis of thoracic aorta is noted. Left-sided pacemaker is unchanged in position. No pneumothorax or pleural effusion is noted. No acute pulmonary disease is noted. Bony thorax is unremarkable. IMPRESSION: Aortic atherosclerosis.  No acute cardiopulmonary abnormality seen. Electronically Signed   By: Marijo Conception, M.D.   On: 05/13/2016 18:34   Dg Hip Unilat W Or Wo Pelvis 2-3 Views Right  Result Date: 05/13/2016 CLINICAL DATA:  Right hip pain after fall. EXAM: DG HIP (WITH OR WITHOUT PELVIS) 2-3V RIGHT COMPARISON:  None. FINDINGS: Moderately displaced fracture is seen involving intertrochanteric region of proximal right femur. Vascular calcifications are noted IMPRESSION: Moderately displaced intertrochanteric fracture of proximal right femur.  Electronically Signed   By: Marijo Conception, M.D.   On: 05/13/2016 18:32    Positive ROS: All other systems have been reviewed and were otherwise negative with the exception of those mentioned in the HPI and as above.  Physical Exam: General: Alert, no acute distress Cardiovascular: No pedal edema Respiratory: No cyanosis, no use of accessory musculature GI: No organomegaly, abdomen is soft and non-tender Skin: No lesions in the area of chief complaint Neurologic: Sensation intact distally Psychiatric: Patient is competent for consent with normal mood and affect Lymphatic: No axillary  or cervical lymphadenopathy  MUSCULOSKELETAL: The patient is this thin, alert female in minimal distress today at rest.  The right leg is shortened and rotated.  There is pain with movement.  Neurovascular status is intact.  No other injuries are noted.    Assessment: Displaced intertrochanteric fracture right hip   Plan: Right hip nailing with a trochanteric fixation nail device.   Park Breed, MD [phone]   05/14/2016 10:15 AM

## 2016-05-14 NOTE — Anesthesia Preprocedure Evaluation (Signed)
Anesthesia Evaluation  Patient identified by MRN, date of birth, ID band Patient awake    Reviewed: Allergy & Precautions, NPO status , Patient's Chart, lab work & pertinent test results  History of Anesthesia Complications Negative for: history of anesthetic complications  Airway Mallampati: III  TM Distance: >3 FB Neck ROM: Full    Dental  (+) Edentulous Upper, Missing, Poor Dentition   Pulmonary neg sleep apnea, COPD,  COPD inhaler, former smoker,    breath sounds clear to auscultation- rhonchi (-) wheezing      Cardiovascular hypertension, Pt. on medications (-) angina+ CAD, + Past MI, + Cardiac Stents and +CHF (diastolic, preserved EF)  (-) CABG + dysrhythmias (paroxysmal afib) Atrial Fibrillation + pacemaker  Rhythm:Regular - Systolic murmurs and - Diastolic murmurs Echo [id number]: NORMAL LEFT VENTRICULAR SYSTOLIC FUNCTION WITH AN ESTIMATED EF = 55-60 % NORMAL RIGHT VENTRICULAR SYSTOLIC FUNCTION SEVERE TRICUSPID VALVE INSUFFICIENCY MILD MITRAL VALVE INSUFFICIENCY TRACE AORTIC VALVE INSUFFICIENCY NO VALVULAR STENOSIS MODERATE RV ENLARGEMENT MODERATE BIATRIAL ENLARGEMENT MILD LVH PACER WIRE NOTED   Neuro/Psych negative neurological ROS  negative psych ROS   GI/Hepatic negative GI ROS, Neg liver ROS,   Endo/Other  neg diabetesHypothyroidism   Renal/GU ESRF and DialysisRenal disease (last dialysis 05/13/16)     Musculoskeletal negative musculoskeletal ROS (+)   Abdominal (+) - obese,   Peds  Hematology negative hematology ROS (+)   Anesthesia Other Findings Past Medical History: No date: Arrhythmia No date: Cancer ([place])     Comment: breast ca - left, kidney No date: Dialysis AV fistula infection ([place])     Comment: Left arm Mon, Wed, Fri dialysis No date: Hypertension No date: MI, old No date: Renal insufficiency     Comment: dialysis   Reproductive/Obstetrics                              Anesthesia Physical Anesthesia Plan  ASA: IV  Anesthesia Plan: General   Post-op Pain Management:    Induction: Intravenous  Airway Management Planned: Oral ETT  Additional Equipment:   Intra-op Plan:   Post-operative Plan: Extubation in OR  Informed Consent: I have reviewed the patients History and Physical, chart, labs and discussed the procedure including the risks, benefits and alternatives for the proposed anesthesia with the patient or authorized representative who has indicated his/her understanding and acceptance.   Dental advisory given  Plan Discussed with: CRNA and Anesthesiologist  Anesthesia Plan Comments:         Anesthesia Quick Evaluation

## 2016-05-14 NOTE — Op Note (Signed)
DATE OF SURGERY:  05/14/2016  TIME: 11:54 AM  PATIENT NAME:  Patricia Woodard  AGE: 82 y.o.  PRE-OPERATIVE DIAGNOSIS:  right hip fracture  POST-OPERATIVE DIAGNOSIS:  SAME  PROCEDURE:  OPEN REDUCTION INTERNAL FIXATION HIP WITH TFN DEVICE  SURGEON:  Ellwood Steidle E  EBL:  50 cc  COMPLICATIONS:  none  OPERATIVE IMPLANTS: Synthes trochanteric femoral nail  130*/30mm  with interlocking helical blade  85 mm and distal locking screw  38 mm.  PREOPERATIVE INDICATIONS:  Patricia Woodard is a 83 y.o. year old who fell and suffered a hip fracture. She was brought into the ER and then admitted and optimized and then elected for surgical intervention.    The risks benefits and alternatives were discussed with the patient including but not limited to the risks of nonoperative treatment, versus surgical intervention including infection, bleeding, nerve injury, malunion, nonunion, hardware prominence, hardware failure, need for hardware removal, blood clots, cardiopulmonary complications, morbidity, mortality, among others, and they were willing to proceed.    OPERATIVE PROCEDURE:  The patient was brought to the operating room and placed in the supine position.  General endotracheal anesthesia was administered, with a foley. She was placed on the fracture table.  Closed reduction was performed under C-arm guidance. The length of the femur was also measured using fluoroscopy. Time out was then performed after sterile prep and drape. She received preoperative antibiotics.  Incision was made proximal to the greater trochanter. A guidewire was placed in the appropriate position. Confirmation was made on AP and lateral views. The above-named nail was opened. I opened the proximal femur with a reamer. I then placed the nail by hand easily down. I did not need to ream the femur.  Once the nail was completely seated, I placed a guidepin into the femoral head into the center center position through a second  incision.  I measured the length, and then reamed the lateral cortex and up into the head. I then placed the helical blade. Slight compression was applied. Anatomic fixation achieved. Bone quality was mediocre.  I then secured the proximal interlock.  The distal locking screw was then placed and after confirming the position of the fracture fragments and hardware I then removed the instruments, and took final C-arm pictures AP and lateral the entire length of the leg. Excellent reconstruction was achieved, and the wounds were irrigated copiously and closed with Vicryl  followed by staples and Aquacel for the skin. Sponge and needle count were correct.   The patient was awakened and returned to PACU in stable and satisfactory condition. There no complications and the patient tolerated the procedure well.  She will be partial weightbearing as tolerated, and will be on Lovenox  For DVT prophylaxis.     Park Breed, M.D.

## 2016-05-14 NOTE — Progress Notes (Signed)
On assessment patient in pain, 9 out of 10. RN gave beta blocker and morphine for pain. Patient in 5lb bucks traction with RLE elevated on 2 pillows. Patient has +2 radial and dorsalis pedal pulse. Patient heart sounds within normal limits. CCMD reported to the nurse that patient is A-paced on telemetry. Family members at bed side. Patient informed consent signed. IV antibiotics attached to chart. Foley catheter is present. Patient alert and oriented X4. Patient has fistula in left forearm, thrill and bruit present. RN got in report that patient received dialysis yesterday and presented with nausea and vomiting afterwards. Patient not complaining of any nausea at this time. RN paged cardiologist and he will be here to release patient for surgery per his recommendation after his assessment.   Deri Fuelling, RN

## 2016-05-14 NOTE — Anesthesia Postprocedure Evaluation (Signed)
Anesthesia Post Note  Patient: Patricia Woodard  Procedure(s) Performed: Procedure(s) (LRB): OPEN REDUCTION INTERNAL FIXATION HIP (Right)  Patient location during evaluation: PACU Anesthesia Type: General Level of consciousness: awake and alert Pain management: pain level controlled Vital Signs Assessment: post-procedure vital signs reviewed and stable Respiratory status: patient remains intubated per anesthesia plan Cardiovascular status: blood pressure returned to baseline and stable Postop Assessment: no signs of nausea or vomiting Anesthetic complications: yes Anesthetic complication details: required intubationComments: Trial extubation attempted in PACU, weak cough and shallow respirations and unable to maintain O2 sats requiring reintubation.    Last Vitals:  Vitals:   05/14/16 0750 05/14/16 1223  BP: (!) 125/45   Pulse: 60   Resp: 16   Temp: 37.2 C 36.2 C    Last Pain:  Vitals:   05/14/16 1323  TempSrc:   PainSc: 0-No pain                 Laylee Schooley

## 2016-05-14 NOTE — Progress Notes (Signed)
New Admission Note:   Arrival Method: per stretcher from ED, pt came from home Mental Orientation: alert and oriented X4 Telemetry: placed on box MX [id number] Assessment: Completed Skin: warm, dry and flaky, no open wounds noted. Old scar on the sacrum from a previously healed sore. Prophylactic sacral foam applied. IV: G22 on the right hand with transparent dressing, flushed, intact. Pain: 8/10 scale on the right hip, will give PRN pain medicine Tubes: foley catheter in place, inserted at ED, attached to urine bag. Fistula on the left forearm with clean dressing, bruit and thrill present. Safety Measures: Safety Fall Prevention Plan has been given and discussed. Admission: Completed 1A Orientation: Patient has been oriented to the room, unit and staff.  Family: children, caregiver and grandchild present at bedside  Orders have been reviewed and implemented. Will continue to monitor the patient. Call light has been placed within reach and bed alarm has been activated.   Georgeanna Harrison BSN, RN [facility] 1A

## 2016-05-14 NOTE — OR Nursing (Signed)
Dr. Randa Lynn at bedside cont to give meds to support B/P

## 2016-05-14 NOTE — OR Nursing (Signed)
Patient on vent settings at 35 fiO2 imv 15 peep 5 pressure support 10 end tidal co2 at 60

## 2016-05-14 NOTE — OR Nursing (Signed)
Patient extubated  and then reintubated patient still weak cont to support B/P

## 2016-05-14 NOTE — Progress Notes (Signed)
[place] Progress Note Patient Name: Patricia Woodard DOB: 1931/06/27 MRN: [id number]   Date of Service  05/14/2016  HPI/Events of Note  Hypotensive post op hip  eICU Interventions  restar neo gtt chk trop, EKG, stat Hb     Intervention Category Intermediate Interventions: Hypotension - evaluation and management  Gable Odonohue V. 05/14/2016, 4:38 PM

## 2016-05-14 NOTE — Anesthesia Procedure Notes (Signed)
Procedure Name: Intubation Date/Time: 05/14/2016 10:28 AM Performed by: Lendon Colonel Pre-anesthesia Checklist: Emergency Drugs available, Patient identified, Suction available, Patient being monitored and Timeout performed Patient Re-evaluated:Patient Re-evaluated prior to inductionOxygen Delivery Method: Circle system utilized and Simple face mask Preoxygenation: Pre-oxygenation with 100% oxygen Intubation Type: IV induction Ventilation: Mask ventilation without difficulty Laryngoscope Size: Miller and 2 Grade View: Grade III Tube type: Oral Tube size: 7.0 mm Number of attempts: 2 Airway Equipment and Method: Stylet Placement Confirmation: ETT inserted through vocal cords under direct vision,  positive ETCO2 and breath sounds checked- equal and bilateral Secured at: 20 cm Tube secured with: Tape Dental Injury: Teeth and Oropharynx as per pre-operative assessment

## 2016-05-14 NOTE — OR Nursing (Signed)
Patient is initiating all breaths rate 15 to 16

## 2016-05-15 ENCOUNTER — Inpatient Hospital Stay: Payer: Medicare Other

## 2016-05-15 DIAGNOSIS — E8770 Fluid overload, unspecified: Secondary | ICD-10-CM

## 2016-05-15 LAB — COMPREHENSIVE METABOLIC PANEL
ALBUMIN: 3.1 g/dL — AB (ref 3.5–5.0)
ALT: 19 U/L (ref 14–54)
ANION GAP: 14 (ref 5–15)
AST: 37 U/L (ref 15–41)
Alkaline Phosphatase: 183 U/L — ABNORMAL HIGH (ref 38–126)
BILIRUBIN TOTAL: 0.8 mg/dL (ref 0.3–1.2)
BUN: 31 mg/dL — ABNORMAL HIGH (ref 6–20)
CO2: 25 mmol/L (ref 22–32)
Calcium: 8.6 mg/dL — ABNORMAL LOW (ref 8.9–10.3)
Chloride: 91 mmol/L — ABNORMAL LOW (ref 101–111)
Creatinine, Ser: 5.01 mg/dL — ABNORMAL HIGH (ref 0.44–1.00)
GFR calc Af Amer: 8 mL/min — ABNORMAL LOW (ref >60)
GFR calc non Af Amer: 7 mL/min — ABNORMAL LOW (ref >60)
GLUCOSE: 122 mg/dL — AB (ref 65–99)
POTASSIUM: 3.3 mmol/L — AB (ref 3.5–5.1)
Sodium: 130 mmol/L — ABNORMAL LOW (ref 135–145)
TOTAL PROTEIN: 7.7 g/dL (ref 6.5–8.1)

## 2016-05-15 LAB — CBC
HEMATOCRIT: 30 % — AB (ref 35.0–47.0)
HEMOGLOBIN: 10.1 g/dL — AB (ref 12.0–16.0)
MCH: 35.6 pg — ABNORMAL HIGH (ref 26.0–34.0)
MCHC: 33.9 g/dL (ref 32.0–36.0)
MCV: 105.1 fL — AB (ref 80.0–100.0)
Platelets: 94 10*3/uL — ABNORMAL LOW (ref 150–440)
RBC: 2.85 MIL/uL — ABNORMAL LOW (ref 3.80–5.20)
RDW: 16.1 % — AB (ref 11.5–14.5)
WBC: 6.7 10*3/uL (ref 3.6–11.0)

## 2016-05-15 LAB — GLUCOSE, CAPILLARY
GLUCOSE-CAPILLARY: 96 mg/dL (ref 65–99)
Glucose-Capillary: 94 mg/dL (ref 65–99)

## 2016-05-15 LAB — PHOSPHORUS: Phosphorus: 5.4 mg/dL — ABNORMAL HIGH (ref 2.5–4.6)

## 2016-05-15 LAB — MAGNESIUM: Magnesium: 1.8 mg/dL (ref 1.7–2.4)

## 2016-05-15 NOTE — Clinical Social Work Note (Signed)
Clinical Social Work Assessment  Patient Details  Name: Patricia Woodard MRN: [id number] Date of Birth: [birth year]  Date of referral:  05/15/16               Reason for consult:  Facility Placement                Permission sought to share information with:  Chartered certified accountant granted to share information::  Yes, Verbal Permission Granted  Name::        Agency::     Relationship::     Contact Information:     Housing/Transportation Living arrangements for the past 2 months:  Single Family Home Source of Information:  Adult Children Patient Interpreter Needed:  None Criminal Activity/Legal Involvement Pertinent to Current Situation/Hospitalization:  No - Comment as needed Significant Relationships:  Adult Children Lives with:  Self Do you feel safe going back to the place where you live?  Yes Need for family participation in patient care:  No (Coment)  Care giving concerns:  STR   Social Worker assessment / plan:  CSW contacted patient's daughter [name] to discuss dc planning. Cassandra gave verbal permission to conduct bed search and indicated that [facility] or [facility] are the top two choices. The patient was recently extubated and is progressing.     Employment status:  Retired Nurse, adult PT Recommendations:  [place] / Referral to community resources:  [place]  Patient/Family's Response to care:  Patient's daughter thanked CSW for assistance.  Patient/Family's Understanding of and Emotional Response to Diagnosis, Current Treatment, and Prognosis: Patient's family in agreement with STR dc plan.  Emotional Assessment Appearance:  Appears stated age Attitude/Demeanor/Rapport:   (Pleasant) Affect (typically observed):  Accepting, Appropriate Orientation:  Oriented to Self, Oriented to Place, Oriented to  Time, Oriented to Situation Alcohol / Substance use:   Never Used Psych involvement (Current and /or in the community):  No (Comment)  Discharge Needs  Concerns to be addressed:  Discharge Planning Concerns Readmission within the last 30 days:  No Current discharge risk:  None Barriers to Discharge:  Continued Medical Work up   [employer], LCSW 05/15/2016, 4:47 PM

## 2016-05-15 NOTE — Evaluation (Signed)
Physical Therapy Evaluation Patient Details Name: Patricia Woodard MRN: [id number] DOB: 02/13/1932 Today's Date: 05/15/2016   History of Present Illness  Pt admitted for R femoral fracture secondary to fall at home. Pt is now POD 1 from ORIF on 11/25.  Stay complicated by developing acute respiratory failure and required CCU stay with ventilation. Pt now extubated this date. Pt currently on bedrest orders, RN aware to modify as necessary. Pt with history of ESRD, on HD, CAD, and pacemaker. HD planned for tomorrow.  Clinical Impression  Pt is a pleasant 82 year old female who was admitted for R hip ORIF. Pt performs bed mobility with max assist and is unable to perform further transfers and ambulation at this time secondary to pain. Pt demonstrates deficits with pain/mobility/strength. Pt extubated this date, however all mobility performed on 2L of O2 with sats WNL during session. Pt still has active bedrest orders, needs removal prior to OOB mobility.  Increased pain with all movement of R LE, positioned with pillows as she tends to favor R knee/hip flexion with hip IR. Would benefit from skilled PT to address above deficits and promote optimal return to PLOF; recommend transition to STR upon discharge from acute hospitalization.       Follow Up Recommendations SNF    Equipment Recommendations       Recommendations for Other Services       Precautions / Restrictions Precautions Precautions: Fall Restrictions Weight Bearing Restrictions: Yes RLE Weight Bearing: Partial weight bearing      Mobility  Bed Mobility Overal bed mobility: Needs Assistance Bed Mobility: Supine to Sit     Supine to sit: Max assist     General bed mobility comments: assist for sequencing and sliding of B LEs off bed. Heavy assist for trunk support. Once seated at EOB, pt only able to tolerate position for 1 minute. Return back supine with +2 assist for scooting up towards [place].  Transfers                  General transfer comment: unable to attempt at this time  Ambulation/Gait                Stairs            Wheelchair Mobility    Modified Rankin (Stroke Patients Only)       Balance Overall balance assessment: History of Falls;Needs assistance Sitting-balance support: Feet supported Sitting balance-Leahy Scale: Poor                                       Pertinent Vitals/Pain Pain Assessment: Faces Faces Pain Scale: Hurts worst Pain Location: with movement of R LE Pain Descriptors / Indicators: Operative site guarding;Aching;Sharp Pain Intervention(s): Limited activity within patient's tolerance;Repositioned    Home Living Family/patient expects to be discharged to:: Private residence Living Arrangements: Children Available Help at Discharge: Family;Available 24 hours/day Type of Home: House Home Access: Ramped entrance     Home Layout: One level Home Equipment: Walker - 2 wheels;Wheelchair - manual      Prior Function Level of Independence: Independent with assistive device(s)         Comments: was able to ambulate house hold distances using RW and was indep with all ADLs     Hand Dominance        Extremity/Trunk Assessment   Upper Extremity Assessment: Generalized weakness (B UE grossly  4/5)           Lower Extremity Assessment: Generalized weakness (R LE unable to test; L LE grossly 3/5)         Communication   Communication: No difficulties  Cognition Arousal/Alertness: Awake/alert Behavior During Therapy: WFL for tasks assessed/performed Overall Cognitive Status: Within Functional Limits for tasks assessed                      General Comments      Exercises Other Exercises Other Exercises: B LE ther-ex performed including ankle pumps, quad sets, SLR, and hip ab/add. All ther-ex performed x 10 reps with cga on L foot and mod assist on R LE.   Assessment/Plan    PT Assessment Patient  needs continued PT services  PT Problem List Decreased strength;Decreased activity tolerance;Decreased balance;Decreased mobility;Pain          PT Treatment Interventions Gait training;DME instruction;Therapeutic activities;Therapeutic exercise    PT Goals (Current goals can be found in the Care Plan section)  Acute Rehab PT Goals Patient Stated Goal: to get stronger PT Goal Formulation: With patient Time For Goal Achievement: 05/29/16 Potential to Achieve Goals: Fair    Frequency BID   Barriers to discharge        Co-evaluation               End of Session Equipment Utilized During Treatment: Oxygen Activity Tolerance: Patient limited by pain Patient left: in bed;with bed alarm set Nurse Communication: Mobility status         Time: 0937-1000 PT Time Calculation (min) (ACUTE ONLY): 23 min   Charges:   PT Evaluation $PT Eval High Complexity: 1 Procedure PT Treatments $Therapeutic Exercise: 8-22 mins   PT G Codes:        Maddelynn Moosman May 31, 2016, 12:30 PM  Greggory Stallion, PT, DPT [phone]

## 2016-05-15 NOTE — Progress Notes (Signed)
[facility] NAME: Patricia Woodard    MR#:  [id number]  DATE OF BIRTH:  1931/12/18  SUBJECTIVE:  CHIEF COMPLAINT:   Chief Complaint  Patient presents with  . Fall  . Hip Pain   - s/p right hip fracture repair 05/14/16, patient remained on vent due to poor clearance of anaesthetics - now more alert and following commands on vent - Has been hypotensive since the surgery, currently on phenylephrine drip - due for dialysis today  REVIEW OF SYSTEMS:  Review of Systems  Constitutional: Negative for chills, fever and malaise/fatigue.  HENT: Negative for congestion, ear discharge, hearing loss and nosebleeds.   Eyes: Negative for blurred vision and double vision.  Respiratory: Negative for cough, shortness of breath and wheezing.   Cardiovascular: Negative for chest pain, palpitations and leg swelling.  Gastrointestinal: Negative for abdominal pain, constipation, diarrhea, nausea and vomiting.  Genitourinary: Negative for dysuria.  Musculoskeletal: Positive for falls, joint pain and myalgias.  Neurological: Negative for dizziness, sensory change, speech change, focal weakness, seizures and headaches.  Psychiatric/Behavioral: Negative for depression.   Intubated but able to nod head appropriately. Complains of right hip pain  DRUG ALLERGIES:   Allergies  Allergen Reactions  . Penicillins Itching  . Ivp Dye [Iodinated Diagnostic Agents] Rash  . Shellfish Allergy Rash    VITALS:  Blood pressure (!) 119/47, pulse (!) 59, temperature 98.9 F (37.2 C), temperature source Oral, resp. rate 15, height 5\' 7"  (1.702 m), weight 57.2 kg (126 lb 1.7 oz), SpO2 100 %.  PHYSICAL EXAMINATION:  Physical Exam  GENERAL:  82 y.o.-year-old Elderly patient lying in the bed with no acute distress.  EYES: Pupils equal, round, reactive to light and accommodation. No scleral icterus. Extraocular muscles intact.  HEENT: Head atraumatic, normocephalic.  Oropharynx and nasopharynx clear. Intubated, on ventilator NECK:  Supple, no jugular venous distention. No thyroid enlargement, no tenderness.  LUNGS: Normal breath sounds bilaterally, no wheezing, rales,rhonchi or crepitation. No use of accessory muscles of respiration. Diminished bibasilar breath sounds. CARDIOVASCULAR: S1, S2 normal. No  rubs, or gallops. 2/6 systolic murmur is present. ICD noted on the chest wall ABDOMEN: Soft, nontender, nondistended. Bowel sounds present. No organomegaly or mass.  EXTREMITIES: No pedal edema, cyanosis, or clubbing. Right hip dressing. Left forearm AV fistula with good thrill noted. Foley catheter in place. NEUROLOGIC: Cranial nerves II through XII are intact. Muscle strength 5/5 in all extremities except right leg movement due to pain. Sensation intact. Gait not checked.  PSYCHIATRIC: The patient is alert and oriented x 3. Following commands SKIN: No obvious rash, lesion, or ulcer.    LABORATORY PANEL:   CBC  Recent Labs Lab 05/15/16 0651  WBC 6.7  HGB 10.1*  HCT 30.0*  PLT 94*   ------------------------------------------------------------------------------------------------------------------  Chemistries   Recent Labs Lab 05/13/16 2116  05/14/16 1703  NA 131*  < > 131*  K 3.6  < > 5.1  CL 92*  < > 98*  CO2 26  < > 19*  GLUCOSE 130*  < > 142*  BUN 34*  < > 38*  CREATININE 5.27*  < > 5.91*  CALCIUM 8.6*  < > 7.8*  MG  --   --  2.0  AST 106*  --   --   ALT 35  --   --   ALKPHOS 211*  --   --   BILITOT 1.2  --   --   < > =  values in this interval not displayed. ------------------------------------------------------------------------------------------------------------------  Cardiac Enzymes  Recent Labs Lab 05/14/16 1703  TROPONINI <0.03   ------------------------------------------------------------------------------------------------------------------  RADIOLOGY:  Ct Head Wo Contrast  Result Date: 05/13/2016 CLINICAL  DATA:  Fall with head and right hip injury 2-3 hours prior. EXAM: CT HEAD WITHOUT CONTRAST CT CERVICAL SPINE WITHOUT CONTRAST TECHNIQUE: Multidetector CT imaging of the head and cervical spine was performed following the standard protocol without intravenous contrast. Multiplanar CT image reconstructions of the cervical spine were also generated. COMPARISON:  12/01/2012 head and cervical spine CT. FINDINGS: CT HEAD FINDINGS Brain: No evidence of parenchymal hemorrhage or extra-axial fluid collection. No mass lesion, mass effect, or midline shift. No CT evidence of acute infarction. Intracranial atherosclerosis. Nonspecific moderate subcortical and periventricular white matter hypodensity, most in keeping with chronic small vessel ischemic change. No ventriculomegaly. Vascular: No hyperdense vessel or unexpected calcification. Skull: No evidence of calvarial fracture. Sinuses/Orbits: The visualized paranasal sinuses are essentially clear. Other:  The mastoid air cells are unopacified. CT CERVICAL SPINE FINDINGS Alignment: Reversal of the normal cervical lordosis. No subluxation. Dens is well positioned between the lateral masses of C1. Skull base and vertebrae: No acute fracture. No primary bone lesion or focal pathologic process. Soft tissues and spinal canal: No prevertebral fluid or swelling. No visible canal hematoma. Disc levels: Mild-to-moderate spondylosis in the lower cervical spine, most prominent at C6-7, not appreciably changed. Mild bilateral facet arthropathy. No significant bony foraminal stenosis. Upper chest: Partially visualized irregular apical left upper lobe pulmonary nodule measuring at least 2.1 cm (series 6/ image 75), not appreciated on 10/23/2014 PET-CT study. Apical 0.7 cm right upper lobe pulmonary nodule (series 6/ image 67), not definitely seen on 10/23/2014 PET-CT. Centrilobular emphysema at the lung apices. Partially visualized left subclavian pacer leads. Other: Visualized mastoid air  cells appear clear. No discrete thyroid nodules. No pathologically enlarged cervical nodes. Extracranial right carotid artery stent. IMPRESSION: 1. No evidence of acute intracranial abnormality. No evidence of calvarial fracture. 2. Moderate chronic small vessel ischemia. 3. No cervical spine fracture or subluxation. 4. Mild degenerative changes in the cervical spine as described . 5. **An incidental finding of potential clinical significance has been found. Partially visualized irregular apical left upper lobe pulmonary nodule measuring at least 2.1 cm. Apical 0.7 cm right upper lobe pulmonary nodule. These nodules were not definitely seen on 10/23/2014 PET-CT study, and primary bronchogenic carcinoma cannot be excluded. Dedicated chest CT is recommended for further evaluation.** Electronically Signed   By: Ilona Sorrel M.D.   On: 05/13/2016 18:18   Ct Cervical Spine Wo Contrast  Result Date: 05/13/2016 CLINICAL DATA:  Fall with head and right hip injury 2-3 hours prior. EXAM: CT HEAD WITHOUT CONTRAST CT CERVICAL SPINE WITHOUT CONTRAST TECHNIQUE: Multidetector CT imaging of the head and cervical spine was performed following the standard protocol without intravenous contrast. Multiplanar CT image reconstructions of the cervical spine were also generated. COMPARISON:  12/01/2012 head and cervical spine CT. FINDINGS: CT HEAD FINDINGS Brain: No evidence of parenchymal hemorrhage or extra-axial fluid collection. No mass lesion, mass effect, or midline shift. No CT evidence of acute infarction. Intracranial atherosclerosis. Nonspecific moderate subcortical and periventricular white matter hypodensity, most in keeping with chronic small vessel ischemic change. No ventriculomegaly. Vascular: No hyperdense vessel or unexpected calcification. Skull: No evidence of calvarial fracture. Sinuses/Orbits: The visualized paranasal sinuses are essentially clear. Other:  The mastoid air cells are unopacified. CT CERVICAL SPINE  FINDINGS Alignment: Reversal of the normal cervical lordosis. No  subluxation. Dens is well positioned between the lateral masses of C1. Skull base and vertebrae: No acute fracture. No primary bone lesion or focal pathologic process. Soft tissues and spinal canal: No prevertebral fluid or swelling. No visible canal hematoma. Disc levels: Mild-to-moderate spondylosis in the lower cervical spine, most prominent at C6-7, not appreciably changed. Mild bilateral facet arthropathy. No significant bony foraminal stenosis. Upper chest: Partially visualized irregular apical left upper lobe pulmonary nodule measuring at least 2.1 cm (series 6/ image 75), not appreciated on 10/23/2014 PET-CT study. Apical 0.7 cm right upper lobe pulmonary nodule (series 6/ image 67), not definitely seen on 10/23/2014 PET-CT. Centrilobular emphysema at the lung apices. Partially visualized left subclavian pacer leads. Other: Visualized mastoid air cells appear clear. No discrete thyroid nodules. No pathologically enlarged cervical nodes. Extracranial right carotid artery stent. IMPRESSION: 1. No evidence of acute intracranial abnormality. No evidence of calvarial fracture. 2. Moderate chronic small vessel ischemia. 3. No cervical spine fracture or subluxation. 4. Mild degenerative changes in the cervical spine as described . 5. **An incidental finding of potential clinical significance has been found. Partially visualized irregular apical left upper lobe pulmonary nodule measuring at least 2.1 cm. Apical 0.7 cm right upper lobe pulmonary nodule. These nodules were not definitely seen on 10/23/2014 PET-CT study, and primary bronchogenic carcinoma cannot be excluded. Dedicated chest CT is recommended for further evaluation.** Electronically Signed   By: Ilona Sorrel M.D.   On: 05/13/2016 18:18   Dg Chest Port 1 View  Result Date: 05/15/2016 CLINICAL DATA:  Follow-up infiltrates EXAM: PORTABLE CHEST 1 VIEW COMPARISON:  05/14/2016 FINDINGS:  Cardiac shadow remains enlarged. A defibrillator is again seen. Endotracheal tube is again noted in satisfactory position. Improved aeration is noted in the bases bilaterally. No focal infiltrate or sizable effusion is seen. IMPRESSION: Improved aeration in the bases bilaterally when compared with the prior exam. Electronically Signed   By: Inez Catalina M.D.   On: 05/15/2016 07:17   Dg Chest Port 1 View  Result Date: 05/14/2016 CLINICAL DATA:  Respiratory distress, status post intubation EXAM: PORTABLE CHEST 1 VIEW COMPARISON:  05/13/2016 FINDINGS: Cardiac shadow is again enlarged. Defibrillator is again seen. Endotracheal tube is now seen in satisfactory position at the level of the aortic knob. Stenting in the left innominate vein extending into the SVC is seen and stable. Mild bibasilar atelectasis is noted. No effusion or pneumothorax is noted. IMPRESSION: Endotracheal tube in satisfactory position. Bibasilar atelectasis. Electronically Signed   By: Inez Catalina M.D.   On: 05/14/2016 14:20   Dg Chest Portable 1 View  Result Date: 05/13/2016 CLINICAL DATA:  Fall. EXAM: PORTABLE CHEST 1 VIEW COMPARISON:  Radiographs of April 21, 2016. FINDINGS: Stable cardiomegaly. Atherosclerosis of thoracic aorta is noted. Left-sided pacemaker is unchanged in position. No pneumothorax or pleural effusion is noted. No acute pulmonary disease is noted. Bony thorax is unremarkable. IMPRESSION: Aortic atherosclerosis.  No acute cardiopulmonary abnormality seen. Electronically Signed   By: Marijo Conception, M.D.   On: 05/13/2016 18:34   Dg Hip Operative Unilat W Or W/o Pelvis Right  Result Date: 05/14/2016 CLINICAL DATA:  Right hip fracture EXAM: OPERATIVE RIGHT HIP WITH PELVIS COMPARISON:  05/13/2016 FLUOROSCOPY TIME:  Radiation Exposure Index (as provided by the fluoroscopic device): Not available If the device does not provide the exposure index: Fluoroscopy Time:  47 seconds Number of Acquired Images:  3 FINDINGS:  Femoral medullary rod with fixation screw proximally is noted. The fracture fragments are in near  anatomic alignment. IMPRESSION: Status post ORIF of proximal right femoral fracture. Electronically Signed   By: Inez Catalina M.D.   On: 05/14/2016 12:00   Dg Hip Unilat W Or Wo Pelvis 2-3 Views Right  Result Date: 05/13/2016 CLINICAL DATA:  Right hip pain after fall. EXAM: DG HIP (WITH OR WITHOUT PELVIS) 2-3V RIGHT COMPARISON:  None. FINDINGS: Moderately displaced fracture is seen involving intertrochanteric region of proximal right femur. Vascular calcifications are noted IMPRESSION: Moderately displaced intertrochanteric fracture of proximal right femur. Electronically Signed   By: Marijo Conception, M.D.   On: 05/13/2016 18:32    EKG:   Orders placed or performed during the hospital encounter of 05/13/16  . ED EKG  . ED EKG  . EKG 12-Lead  . EKG 12-Lead  . EKG 12-Lead  . EKG 12-Lead    ASSESSMENT AND PLAN:   82 year old female with past medical history significant for chronic atrial fibrillation and arrhythmia status post ICD and pacemaker, CAD status post PCI in remote past, end-stage renal disease on Monday Wednesday Friday hemodialysis, history of breast cancer, hypertension and diastolic heart failure presents to hospital after fall and right hip fracture.  #1 right femur intertrochanteric fracture-secondary to mechanical fall. -s/p surgery 05/14/16, POD #1 today - intubated- but most likely to be extubated this morning - On Pain medications. physical therapy and DVT prophylaxis to be started today - d/c foley -Appreciate orthopedics consult  #2 end-stage renal disease on hemodialysis-on Monday Wednesday Friday dialysis. -Has left forearm AV fistula. Nephrology consulted for the same. -Discontinued her potassium supplements  #3 history of atrial fibrillation-rate controlled at this time. Hold Coreg for hypotension. Also on amiodarone. -Status post ICD and pacemaker for history of  tachyarrhythmias - Not on chronic anticoagulation discussed by cardiology. restart aspirin and Plavix at discharge which are held for surgery at this time  #4 chronic diastolic CHF-well compensated at this time. Continue dialysis per schedule -hold cardiac medications  losartan, Imdur, hydralazine and Coreg due to hypotension.  #5 hypertension- Hypotensive since the surgery. Hold all her blood pressure medications including Coreg, Norvasc, Imdur, Lasix, hydralazine and losartan  #6 DVT prophylaxis- start SQ heparin  Physical therapy consult today    All the records are reviewed and case discussed with Care Management/Social Workerr. Management plans discussed with the patient, family and they are in agreement.  CODE STATUS: Full code  TOTAL TIME TAKING CARE OF THIS PATIENT: 38 minutes.   POSSIBLE D/C TO REHAB IN 2 DAYS, DEPENDING ON CLINICAL CONDITION.   Gladstone Lighter M.D on 05/15/2016 at 8:06 AM  Between 7am to 6pm - Pager - [phone]  After 6pm go to [email or web address] - password [email or web address] [facility]  Office  [phone]  CC: Primary care physician; Madelyn Brunner, MD

## 2016-05-15 NOTE — Progress Notes (Signed)
Subjective:   Patient known to our practice from outpatient. She dialyzes at [facility] dialysis unit on Monday, Wednesday, Friday Her husband reports that she went home after dialysis on Friday. Then slipped in the bathroom at home. She suffered right hip fracture. Underwent open reduction internal fixation of the right hip joint by Dr. Sabra Heck on November 25. She is doing well postoperatively. She is now extubated. Still requiring low-dose pressors for hypotension  Objective:  Vital signs in last 24 hours:  Temp:  [97 F (36.1 C)-98.9 F (37.2 C)] 98.9 F (37.2 C) (11/26 0800) Pulse Rate:  [30-62] 59 (11/26 0800) Resp:  [12-26] 15 (11/26 0800) BP: (50-124)/(37-66) 119/47 (11/26 0800) SpO2:  [96 %-100 %] 100 % (11/26 0800) FiO2 (%):  [28 %-35 %] 28 % (11/26 0446) Weight:  [57 kg (125 lb 10.6 oz)-57.2 kg (126 lb 1.7 oz)] 57.2 kg (126 lb 1.7 oz) (11/26 0500)  Weight change: 2.568 kg (5 lb 10.6 oz) Filed Weights   05/14/16 0655 05/14/16 1541 05/15/16 0500  Weight: 58.1 kg (128 lb) 57 kg (125 lb 10.6 oz) 57.2 kg (126 lb 1.7 oz)    Intake/Output:    Intake/Output Summary (Last 24 hours) at 05/15/16 1028 Last data filed at 05/15/16 0800  Gross per 24 hour  Intake          4097.13 ml  Output               35 ml  Net          4062.13 ml     Physical Exam: General: No acute distress, lying in the bed   HEENT Dry oral mucous membranes   Neck Supple   Pulm/lungs Normal breathing effort, clear anteriorly and laterally   CVS/Heart Paced rhythm on cardiac monitor   Abdomen:  Soft, nontender   Extremities: No peripheral edema   Neurologic: Alert, able to follow commands   Skin: Warm dry   Access: Left arm AV fistula        Basic Metabolic Panel:   Recent Labs Lab 05/13/16 1736 05/13/16 2116 05/14/16 0648 05/14/16 1703 05/15/16 0651  NA 130* 131* 132* 131*  --   K 3.3* 3.6 4.5 5.1  --   CL 91* 92* 95* 98*  --   CO2 25 26 27  19*  --   GLUCOSE 122* 130* 85 142*  --    BUN 31* 34* 35* 38*  --   CREATININE 5.01* 5.27* 5.72* 5.91*  --   CALCIUM 8.6* 8.6* 8.4* 7.8*  --   MG  --   --   --  2.0 1.8  PHOS  --   --   --   --  5.4*     CBC:  Recent Labs Lab 05/13/16 1736 05/13/16 2116 05/14/16 0648 05/14/16 1703 05/15/16 0651  WBC 7.1 7.1 5.1 6.5 6.7  NEUTROABS 4.6 5.6  --   --   --   HGB 14.0 13.5 12.4 10.5* 10.1*  HCT 41.4 40.5 36.5 33.4* 30.0*  MCV 103.3* 103.1* 103.6* 109.7* 105.1*  PLT 126* 104* 106* 78* 94*      Microbiology:  Recent Results (from the past 720 hour(s))  Surgical pcr screen     Status: None   Collection Time: 05/13/16 10:41 PM  Result Value Ref Range Status   MRSA, PCR NEGATIVE NEGATIVE Final   Staphylococcus aureus NEGATIVE NEGATIVE Final    Comment:        The Xpert SA Assay (  FDA approved for NASAL specimens in patients over 57 years of age), is one component of a comprehensive surveillance program.  Test performance has been validated by [facility] for patients greater than or equal to 76 year old. It is not intended to diagnose infection nor to guide or monitor treatment.     Coagulation Studies:  Recent Labs  05/13/16 2116  LABPROT 14.2  INR 1.10    Urinalysis: No results for input(s): COLORURINE, LABSPEC, PHURINE, GLUCOSEU, HGBUR, BILIRUBINUR, KETONESUR, PROTEINUR, UROBILINOGEN, NITRITE, LEUKOCYTESUR in the last 72 hours.  Invalid input(s): APPERANCEUR    Imaging: Ct Head Wo Contrast  Result Date: 05/13/2016 CLINICAL DATA:  Fall with head and right hip injury 2-3 hours prior. EXAM: CT HEAD WITHOUT CONTRAST CT CERVICAL SPINE WITHOUT CONTRAST TECHNIQUE: Multidetector CT imaging of the head and cervical spine was performed following the standard protocol without intravenous contrast. Multiplanar CT image reconstructions of the cervical spine were also generated. COMPARISON:  12/01/2012 head and cervical spine CT. FINDINGS: CT HEAD FINDINGS Brain: No evidence of parenchymal hemorrhage or  extra-axial fluid collection. No mass lesion, mass effect, or midline shift. No CT evidence of acute infarction. Intracranial atherosclerosis. Nonspecific moderate subcortical and periventricular white matter hypodensity, most in keeping with chronic small vessel ischemic change. No ventriculomegaly. Vascular: No hyperdense vessel or unexpected calcification. Skull: No evidence of calvarial fracture. Sinuses/Orbits: The visualized paranasal sinuses are essentially clear. Other:  The mastoid air cells are unopacified. CT CERVICAL SPINE FINDINGS Alignment: Reversal of the normal cervical lordosis. No subluxation. Dens is well positioned between the lateral masses of C1. Skull base and vertebrae: No acute fracture. No primary bone lesion or focal pathologic process. Soft tissues and spinal canal: No prevertebral fluid or swelling. No visible canal hematoma. Disc levels: Mild-to-moderate spondylosis in the lower cervical spine, most prominent at C6-7, not appreciably changed. Mild bilateral facet arthropathy. No significant bony foraminal stenosis. Upper chest: Partially visualized irregular apical left upper lobe pulmonary nodule measuring at least 2.1 cm (series 6/ image 75), not appreciated on 10/23/2014 PET-CT study. Apical 0.7 cm right upper lobe pulmonary nodule (series 6/ image 67), not definitely seen on 10/23/2014 PET-CT. Centrilobular emphysema at the lung apices. Partially visualized left subclavian pacer leads. Other: Visualized mastoid air cells appear clear. No discrete thyroid nodules. No pathologically enlarged cervical nodes. Extracranial right carotid artery stent. IMPRESSION: 1. No evidence of acute intracranial abnormality. No evidence of calvarial fracture. 2. Moderate chronic small vessel ischemia. 3. No cervical spine fracture or subluxation. 4. Mild degenerative changes in the cervical spine as described . 5. **An incidental finding of potential clinical significance has been found. Partially  visualized irregular apical left upper lobe pulmonary nodule measuring at least 2.1 cm. Apical 0.7 cm right upper lobe pulmonary nodule. These nodules were not definitely seen on 10/23/2014 PET-CT study, and primary bronchogenic carcinoma cannot be excluded. Dedicated chest CT is recommended for further evaluation.** Electronically Signed   By: Ilona Sorrel M.D.   On: 05/13/2016 18:18   Ct Cervical Spine Wo Contrast  Result Date: 05/13/2016 CLINICAL DATA:  Fall with head and right hip injury 2-3 hours prior. EXAM: CT HEAD WITHOUT CONTRAST CT CERVICAL SPINE WITHOUT CONTRAST TECHNIQUE: Multidetector CT imaging of the head and cervical spine was performed following the standard protocol without intravenous contrast. Multiplanar CT image reconstructions of the cervical spine were also generated. COMPARISON:  12/01/2012 head and cervical spine CT. FINDINGS: CT HEAD FINDINGS Brain: No evidence of parenchymal hemorrhage or extra-axial fluid collection.  No mass lesion, mass effect, or midline shift. No CT evidence of acute infarction. Intracranial atherosclerosis. Nonspecific moderate subcortical and periventricular white matter hypodensity, most in keeping with chronic small vessel ischemic change. No ventriculomegaly. Vascular: No hyperdense vessel or unexpected calcification. Skull: No evidence of calvarial fracture. Sinuses/Orbits: The visualized paranasal sinuses are essentially clear. Other:  The mastoid air cells are unopacified. CT CERVICAL SPINE FINDINGS Alignment: Reversal of the normal cervical lordosis. No subluxation. Dens is well positioned between the lateral masses of C1. Skull base and vertebrae: No acute fracture. No primary bone lesion or focal pathologic process. Soft tissues and spinal canal: No prevertebral fluid or swelling. No visible canal hematoma. Disc levels: Mild-to-moderate spondylosis in the lower cervical spine, most prominent at C6-7, not appreciably changed. Mild bilateral facet  arthropathy. No significant bony foraminal stenosis. Upper chest: Partially visualized irregular apical left upper lobe pulmonary nodule measuring at least 2.1 cm (series 6/ image 75), not appreciated on 10/23/2014 PET-CT study. Apical 0.7 cm right upper lobe pulmonary nodule (series 6/ image 67), not definitely seen on 10/23/2014 PET-CT. Centrilobular emphysema at the lung apices. Partially visualized left subclavian pacer leads. Other: Visualized mastoid air cells appear clear. No discrete thyroid nodules. No pathologically enlarged cervical nodes. Extracranial right carotid artery stent. IMPRESSION: 1. No evidence of acute intracranial abnormality. No evidence of calvarial fracture. 2. Moderate chronic small vessel ischemia. 3. No cervical spine fracture or subluxation. 4. Mild degenerative changes in the cervical spine as described . 5. **An incidental finding of potential clinical significance has been found. Partially visualized irregular apical left upper lobe pulmonary nodule measuring at least 2.1 cm. Apical 0.7 cm right upper lobe pulmonary nodule. These nodules were not definitely seen on 10/23/2014 PET-CT study, and primary bronchogenic carcinoma cannot be excluded. Dedicated chest CT is recommended for further evaluation.** Electronically Signed   By: Ilona Sorrel M.D.   On: 05/13/2016 18:18   Dg Chest Port 1 View  Result Date: 05/15/2016 CLINICAL DATA:  Follow-up infiltrates EXAM: PORTABLE CHEST 1 VIEW COMPARISON:  05/14/2016 FINDINGS: Cardiac shadow remains enlarged. A defibrillator is again seen. Endotracheal tube is again noted in satisfactory position. Improved aeration is noted in the bases bilaterally. No focal infiltrate or sizable effusion is seen. IMPRESSION: Improved aeration in the bases bilaterally when compared with the prior exam. Electronically Signed   By: Inez Catalina M.D.   On: 05/15/2016 07:17   Dg Chest Port 1 View  Result Date: 05/14/2016 CLINICAL DATA:  Respiratory  distress, status post intubation EXAM: PORTABLE CHEST 1 VIEW COMPARISON:  05/13/2016 FINDINGS: Cardiac shadow is again enlarged. Defibrillator is again seen. Endotracheal tube is now seen in satisfactory position at the level of the aortic knob. Stenting in the left innominate vein extending into the SVC is seen and stable. Mild bibasilar atelectasis is noted. No effusion or pneumothorax is noted. IMPRESSION: Endotracheal tube in satisfactory position. Bibasilar atelectasis. Electronically Signed   By: Inez Catalina M.D.   On: 05/14/2016 14:20   Dg Chest Portable 1 View  Result Date: 05/13/2016 CLINICAL DATA:  Fall. EXAM: PORTABLE CHEST 1 VIEW COMPARISON:  Radiographs of April 21, 2016. FINDINGS: Stable cardiomegaly. Atherosclerosis of thoracic aorta is noted. Left-sided pacemaker is unchanged in position. No pneumothorax or pleural effusion is noted. No acute pulmonary disease is noted. Bony thorax is unremarkable. IMPRESSION: Aortic atherosclerosis.  No acute cardiopulmonary abnormality seen. Electronically Signed   By: Marijo Conception, M.D.   On: 05/13/2016 18:34   Dg Hip  Operative Unilat W Or W/o Pelvis Right  Result Date: 05/14/2016 CLINICAL DATA:  Right hip fracture EXAM: OPERATIVE RIGHT HIP WITH PELVIS COMPARISON:  05/13/2016 FLUOROSCOPY TIME:  Radiation Exposure Index (as provided by the fluoroscopic device): Not available If the device does not provide the exposure index: Fluoroscopy Time:  47 seconds Number of Acquired Images:  3 FINDINGS: Femoral medullary rod with fixation screw proximally is noted. The fracture fragments are in near anatomic alignment. IMPRESSION: Status post ORIF of proximal right femoral fracture. Electronically Signed   By: Inez Catalina M.D.   On: 05/14/2016 12:00   Dg Hip Unilat W Or Wo Pelvis 2-3 Views Right  Result Date: 05/13/2016 CLINICAL DATA:  Right hip pain after fall. EXAM: DG HIP (WITH OR WITHOUT PELVIS) 2-3V RIGHT COMPARISON:  None. FINDINGS: Moderately  displaced fracture is seen involving intertrochanteric region of proximal right femur. Vascular calcifications are noted IMPRESSION: Moderately displaced intertrochanteric fracture of proximal right femur. Electronically Signed   By: Marijo Conception, M.D.   On: 05/13/2016 18:32     Medications:   . phenylephrine (NEO-SYNEPHRINE) Adult infusion 60 mcg/min (05/15/16 0852)   . acetaminophen  1,000 mg Oral Q6H  . amiodarone  200 mg Oral Daily  . anastrozole  1 mg Oral Daily  . aspirin EC  325 mg Oral Q breakfast  . chlorhexidine gluconate (MEDLINE KIT)  15 mL Mouth Rinse BID  . clindamycin (CLEOCIN) IV  600 mg Intravenous Q8H  . ferrous sulfate  325 mg Oral Q breakfast  . heparin  5,000 Units Subcutaneous Q12H  . levothyroxine  100 mcg Oral QAC breakfast  . mouth rinse  15 mL Mouth Rinse QID  . mirtazapine  15 mg Oral QHS  . multivitamin  1 tablet Oral Daily  . pantoprazole  40 mg Oral Daily  . pravastatin  40 mg Oral q1800  . senna  1 tablet Oral BID  . sevelamer carbonate  800 mg Oral TID WC   sodium chloride, acetaminophen **OR** acetaminophen, albuterol, alum & mag hydroxide-simeth, bisacodyl, diphenhydrAMINE, fentaNYL (SUBLIMAZE) injection, HYDROcodone-acetaminophen, magnesium hydroxide, menthol-cetylpyridinium **OR** phenol, metoCLOPramide **OR** metoCLOPramide (REGLAN) injection, ondansetron **OR** ondansetron (ZOFRAN) IV, sodium phosphate, zolpidem  Assessment/ Plan:  82 y.o. female with hypertension, MI requiring hypothermia treatment, congestive heart failure with EF , dilated cardiomyopathy, ESRD, hyperlipidemia, peripheral vascular disease, renal cell carcinoma s/p radiation therapy (RCC followed at [facility]), rt carotid stent 08/2012, Left hip fracture repair (june 2014), epo ok'd by [facility], h/o breast cancer. Rt hip fracture s/p ORIF 05/14/2016 No [facility]/ MWF-2. Patricia Woodard  1. ESRD -  Will plan for dialysis on Monday - minimal UF as BP is low requiring pressors  2.  AOCKD - Hold EPO for now  3. Pulmonary nodules - Discussed with Dr Juanell Fairly. May need dedicated CT to evaluate  4. SHPTH - monitor Phos  5. S/P ORIF for rt Hip fracture 11/25 - doing well post op     LOS: 2 Tamber Burtch 11/26/201710:28 AM

## 2016-05-15 NOTE — NC FL2 (Signed)
[facility] LEVEL OF CARE SCREENING TOOL     IDENTIFICATION  Patient Name: Patricia Woodard Birthdate: 1932-06-15 Sex: female Admission Date (Current Location): 05/13/2016  [place] and [place] Number:  Engineering geologist and Address:  [facility], [address] [place], [place] [address]      Provider Number: [id number]  Attending Physician Name and Address:  Laverle Hobby, MD  Relative Name and Phone Number:       Current Level of Care: Hospital Recommended Level of Care: [place] Prior Approval Number:    Date Approved/Denied: 12/06/12 PASRR Number: [id number]  Discharge Plan: SNF    Current Diagnoses: Patient Active Problem List   Diagnosis Date Noted  . Respiratory failure, acute ([place]) 05/14/2016  . Closed right hip fracture ([place]) 05/13/2016    Orientation RESPIRATION BLADDER Height & Weight     Self, Time, Situation, Place  Normal Continent Weight: 126 lb 1.7 oz (57.2 kg) Height:  5' 7"  (170.2 cm)  BEHAVIORAL SYMPTOMS/MOOD NEUROLOGICAL BOWEL NUTRITION STATUS      Continent    AMBULATORY STATUS COMMUNICATION OF NEEDS Skin   Total Care Verbally Surgical wounds                       Personal Care Assistance Level of Assistance  Bathing, Dressing Bathing Assistance: Limited assistance Feeding assistance: Independent Dressing Assistance: Limited assistance     Functional Limitations Info             SPECIAL CARE FACTORS FREQUENCY  PT (By licensed PT), OT (By licensed OT)     PT Frequency: Up to 5X per day; 5 days per week OT Frequency: Up to 5X per day; 5 days per week            Contractures Contractures Info: Present    Additional Factors Info  Allergies   Allergies Info: Penicillins, Ivp Dye Iodinated Diagnostic Agents, Shellfish Allergy           Current Medications (05/15/2016):  This is the current hospital active medication list Current Facility-Administered  Medications  Medication Dose Route Frequency Provider Last Rate Last Dose  . 0.9 %  sodium chloride infusion  250 mL Intravenous PRN Laverle Hobby, MD      . acetaminophen (TYLENOL) tablet 650 mg  650 mg Oral Q6H PRN Demetrios Loll, MD       Or  . acetaminophen (TYLENOL) suppository 650 mg  650 mg Rectal Q6H PRN Demetrios Loll, MD      . acetaminophen (TYLENOL) tablet 1,000 mg  1,000 mg Oral Q6H Earnestine Leys, MD   Stopped at 05/14/16 1801  . albuterol (PROVENTIL) (2.5 MG/3ML) 0.083% nebulizer solution 2.5 mg  2.5 mg Nebulization Q2H PRN Demetrios Loll, MD      . alum & mag hydroxide-simeth (MAALOX/MYLANTA) 200-200-20 MG/5ML suspension 30 mL  30 mL Oral Q4H PRN Earnestine Leys, MD      . amiodarone (PACERONE) tablet 200 mg  200 mg Oral Daily Demetrios Loll, MD   Stopped at 05/14/16 1000  . anastrozole (ARIMIDEX) tablet 1 mg  1 mg Oral Daily Demetrios Loll, MD   Stopped at 05/14/16 1000  . aspirin EC tablet 325 mg  325 mg Oral Q breakfast Earnestine Leys, MD   Stopped at 05/15/16 1010  . bisacodyl (DULCOLAX) suppository 10 mg  10 mg Rectal Daily PRN Earnestine Leys, MD      . chlorhexidine gluconate (MEDLINE KIT) (PERIDEX) 0.12 % solution  15 mL  15 mL Mouth Rinse BID Laverle Hobby, MD   15 mL at 05/15/16 0759  . diphenhydrAMINE (BENADRYL) tablet 25 mg  25 mg Oral Q6H PRN Demetrios Loll, MD      . fentaNYL (SUBLIMAZE) injection 25-50 mcg  25-50 mcg Intravenous Q2H PRN Mikael Spray, NP   25 mcg at 05/15/16 1024  . ferrous sulfate tablet 325 mg  325 mg Oral Q breakfast Earnestine Leys, MD   Stopped at 05/15/16 1011  . heparin injection 5,000 Units  5,000 Units Subcutaneous Q12H Laverle Hobby, MD   5,000 Units at 05/15/16 1023  . HYDROcodone-acetaminophen (NORCO/VICODIN) 5-325 MG per tablet 1-2 tablet  1-2 tablet Oral Q6H PRN Earnestine Leys, MD   2 tablet at 05/14/16 0406  . levothyroxine (SYNTHROID, LEVOTHROID) tablet 100 mcg  100 mcg Oral QAC breakfast Demetrios Loll, MD   Stopped at 05/15/16 1011  . magnesium  hydroxide (MILK OF MAGNESIA) suspension 30 mL  30 mL Oral Daily PRN Earnestine Leys, MD      . MEDLINE mouth rinse  15 mL Mouth Rinse QID Laverle Hobby, MD   15 mL at 05/15/16 1557  . menthol-cetylpyridinium (CEPACOL) lozenge 3 mg  1 lozenge Oral PRN Earnestine Leys, MD       Or  . phenol (CHLORASEPTIC) mouth spray 1 spray  1 spray Mouth/Throat PRN Earnestine Leys, MD      . metoCLOPramide (REGLAN) tablet 5-10 mg  5-10 mg Oral Q8H PRN Earnestine Leys, MD       Or  . metoCLOPramide (REGLAN) injection 5-10 mg  5-10 mg Intravenous Q8H PRN Earnestine Leys, MD      . mirtazapine (REMERON) tablet 15 mg  15 mg Oral QHS Demetrios Loll, MD   15 mg at 05/13/16 2114  . multivitamin (RENA-VIT) tablet 1 tablet  1 tablet Oral Daily Demetrios Loll, MD   Stopped at 05/14/16 1000  . ondansetron (ZOFRAN) tablet 4 mg  4 mg Oral Q6H PRN Demetrios Loll, MD       Or  . ondansetron [facility]) injection 4 mg  4 mg Intravenous Q6H PRN Demetrios Loll, MD   4 mg at 05/14/16 0002  . pantoprazole (PROTONIX) EC tablet 40 mg  40 mg Oral Daily Demetrios Loll, MD   Stopped at 05/14/16 1000  . phenylephrine (NEO-SYNEPHRINE) 10 mg in dextrose 5 % 250 mL (0.04 mg/mL) infusion  0-400 mcg/min Intravenous Titrated Laverle Hobby, MD 60 mL/hr at 05/15/16 1611 40 mcg/min at 05/15/16 1611  . pravastatin (PRAVACHOL) tablet 40 mg  40 mg Oral q1800 Demetrios Loll, MD   Stopped at 05/14/16 1801  . senna (SENOKOT) tablet 8.6 mg  1 tablet Oral BID Earnestine Leys, MD   Stopped at 05/14/16 1000  . sevelamer carbonate (RENVELA) tablet 800 mg  800 mg Oral TID WC Demetrios Loll, MD   Stopped at 05/14/16 [phone]  . sodium phosphate (FLEET) 7-19 GM/118ML enema 1 enema  1 enema Rectal Once PRN Earnestine Leys, MD      . zolpidem Lorrin Mais) tablet 5 mg  5 mg Oral QHS PRN Earnestine Leys, MD         Discharge Medications: Please see discharge summary for a list of discharge medications.  Relevant Imaging Results:  Relevant Lab Results:   Additional Information  SS #  [id number]  Zettie Pho, LCSW

## 2016-05-15 NOTE — Progress Notes (Signed)
Subjective: 1 Day Post-Op Procedure(s) (LRB): OPEN REDUCTION INTERNAL FIXATION HIP (Right)    Patient reports pain as mild. Patient stayed intubated overnight in ICU. She has been extubated and is doing well. Should be transferred to the medical floor today. She answers questions and motion of the hip is only mildly painful.   Objective:   VITALS:   Vitals:   05/15/16 0700 05/15/16 0800  BP: (!) 102/46 (!) 119/47  Pulse: 60 (!) 59  Resp: 14 15  Temp:  98.9 F (37.2 C)    Neurologically intact ABD soft Neurovascular intact Sensation intact distally Intact pulses distally Dorsiflexion/Plantar flexion intact Incision: scant drainage  LABS  Recent Labs  05/14/16 0648 05/14/16 1703 05/15/16 0651  HGB 12.4 10.5* 10.1*  HCT 36.5 33.4* 30.0*  WBC 5.1 6.5 6.7  PLT 106* 78* 94*     Recent Labs  05/13/16 2116 05/14/16 0648 05/14/16 1703  NA 131* 132* 131*  K 3.6 4.5 5.1  BUN 34* 35* 38*  CREATININE 5.27* 5.72* 5.91*  GLUCOSE 130* 85 142*     Recent Labs  05/13/16 2116  INR 1.10     Assessment/Plan: 1 Day Post-Op Procedure(s) (LRB): OPEN REDUCTION INTERNAL FIXATION HIP (Right)   Advance diet Up with therapy Discharge to SNF

## 2016-05-15 NOTE — Progress Notes (Signed)
Pt remains on vent, awake and alert, able to communicate needs and follows commands.  Pt has received prn fentanyl about every 2 hrs as pt nods that she is in pain.  Atrial paced on telemetry, rhonchi upon auscultation.  Remains NPO, NG/OG was never placed, plan for SBT this am with hopeful extubation.  Remains on Phenlyephrine now at 80 mcg.  Foley remains in place, oliguria as pt is HD patients.  Afebrile.

## 2016-05-15 NOTE — Progress Notes (Signed)
Pt was extubated this AM around 0820, but unable to swallow water well. Unable to take po medications. Tried to wean of neo but unable to maintain BP without it. Foley discontinued, pt anuric, dialysis pt. Remains apaced on monitor.

## 2016-05-15 NOTE — Progress Notes (Deleted)
Initial Nutrition Assessment  DOCUMENTATION CODES:   Not applicable  INTERVENTION:  -If unable to extubate, recommend Vital 1.2 @ 40ml/hr Provides 1296 calories, 81gm protein, 876cc free water  NUTRITION DIAGNOSIS:   Inadequate oral intake related to inability to eat as evidenced by NPO status.  GOAL:   Patient will meet greater than or equal to 90% of their needs  MONITOR:   Vent status, Labs, TF tolerance, I & O's, Weight trends  REASON FOR ASSESSMENT:   Ventilator    ASSESSMENT:   A 82 year old female status post right intertrochanteric fracture, right hip pinning, with postoperative respiratory failure with reintubation, postoperative hypotension, history of pulmonary hypertension.  Patient is currently intubated on ventilator support MV: 6 L/min Temp (24hrs), Avg:97.7 F (36.5 C), Min:97 F (36.1 C), Max:98.9 F (37.2 C) Propofol: none Labs and medications reviewed: Na 131, Phos 5.4 MVI, Senokot, Iron,  Neo gtt Unable to complete Nutrition-Focused physical exam at this time.   Diet Order:  Diet regular Room service appropriate? Yes; Fluid consistency: Thin  Skin:  Reviewed, no issues  Last BM:  11/24  Height:   Ht Readings from Last 1 Encounters:  05/14/16 5\' 7"  (1.702 m)    Weight:   Wt Readings from Last 1 Encounters:  05/15/16 126 lb 1.7 oz (57.2 kg)    Ideal Body Weight:  61.36 kg  BMI:  Body mass index is 19.75 kg/m.  Estimated Nutritional Needs:   Kcal:  [id number]  Protein:  68-85 gm  Fluid:  >/= 1.2L  EDUCATION NEEDS:   No education needs identified at this time  Patricia Anis. Jamerson Vonbargen, MS, RD LDN Inpatient Clinical Dietitian Pager [phone]

## 2016-05-15 NOTE — H&P (Signed)
[facility] Consultation     ASSESSMENT/PLAN   A 82 year old female status post right intertrochanteric fracture, right hip pinning, with postoperative respiratory failure with reintubation, postoperative hypotension, history of pulmonary hypertension.  PULMONARY A: Acute respiratory failure, possibly secondary to atelectasis, as well as pulmonary hypertension and volume overload --Extubated this am. P:   -Continue to monitor respiratory status.  CARDIOVASCULAR A: Postoperative hypotension. -Coronary artery disease. P:  -Titrate phenylephrine as needed.  RENAL A:  End-stage renal disease on hemodialysis. P:   Nephrology consulted.  GASTROINTESTINAL A:  - P:   -  HEMATOLOGIC A:  - P:  -  INFECTIOUS A:  -- P:    Micro/culture results:  BCx2 -- UC -- Sputum--  Antibiotics:   ENDOCRINE A:  --   P:   --  NEUROLOGIC A:  -- P:   RASS goal: -- --   MAJOR EVENTS/TEST RESULTS:   Best Practices  DVT Prophylaxis: SubQ heparin.  GI Prophylaxis: --   ---------------------------------------  ---------------------------------------  REVIEW OF SYSTEMS:   Could not be obtained as patient is on the ventilator.  VITAL SIGNS: Temp:  [97 F (36.1 C)-98.9 F (37.2 C)] 98.9 F (37.2 C) (11/26 0800) Pulse Rate:  [30-62] 59 (11/26 0800) Resp:  [12-26] 15 (11/26 0800) BP: (50-124)/(37-66) 119/47 (11/26 0800) SpO2:  [96 %-100 %] 100 % (11/26 0800) FiO2 (%):  [28 %-35 %] 28 % (11/26 0446) Weight:  [57 kg (125 lb 10.6 oz)-57.2 kg (126 lb 1.7 oz)] 57.2 kg (126 lb 1.7 oz) (11/26 0500) HEMODYNAMICS:   VENTILATOR SETTINGS: Vent Mode: PRVC FiO2 (%):  [28 %-35 %] 28 % Set Rate:  [12 bmp] 12 bmp Vt Set:  [500 mL] 500 mL PEEP:  [5 cmH20] 5 cmH20 INTAKE / OUTPUT:  Intake/Output Summary (Last 24 hours) at 05/15/16 1033 Last data filed at 05/15/16 0800  Gross per 24 hour  Intake          4097.13 ml  Output               35 ml  Net           4062.13 ml    Physical Examination:   VS: BP (!) 119/47   Pulse (!) 59   Temp 98.9 F (37.2 C) (Oral)   Resp 15   Ht 5\' 7"  (1.702 m)   Wt 57.2 kg (126 lb 1.7 oz)   SpO2 100%   BMI 19.75 kg/m   General Appearance: No distress  Neuro:without focal findings, HEENT: PERRLA, EOM intact, no ptosis, no other lesions noticed;  Pulmonary: normal breath sounds., diaphragmatic excursion normal. CardiovascularNormal S1,S2.  No m/r/g.    Abdomen: Benign, Soft, non-tender, No masses, hepatosplenomegaly, No lymphadenopathy Renal:  No costovertebral tenderness  GU:  Not performed at this time. Endoc: No evident thyromegaly, no signs of acromegaly. Skin:   warm, no rashes, no ecchymosis  Extremities: normal, no cyanosis, clubbing, no edema, warm with normal capillary refill.    LABS: Reviewed   LABORATORY PANEL:   CBC  Recent Labs Lab 05/15/16 0651  WBC 6.7  HGB 10.1*  HCT 30.0*  PLT 94*    Chemistries   Recent Labs Lab 05/13/16 2116  05/14/16 1703 05/15/16 0651  NA 131*  < > 131*  --   K 3.6  < > 5.1  --   CL 92*  < > 98*  --   CO2 26  < > 19*  --   GLUCOSE  130*  < > 142*  --   BUN 34*  < > 38*  --   CREATININE 5.27*  < > 5.91*  --   CALCIUM 8.6*  < > 7.8*  --   MG  --   < > 2.0 1.8  PHOS  --   --   --  5.4*  AST 106*  --   --   --   ALT 35  --   --   --   ALKPHOS 211*  --   --   --   BILITOT 1.2  --   --   --   < > = values in this interval not displayed.   Recent Labs Lab 05/14/16 1941 05/15/16 0724  GLUCAP 123* 94    Recent Labs Lab 05/14/16 2222  PHART 7.29*  PCO2ART 47  PO2ART 87    Recent Labs Lab 05/13/16 1736 05/13/16 2116  AST 37 106*  ALT 19 35  ALKPHOS 183* 211*  BILITOT <0.1* 1.2  ALBUMIN 3.1* 3.3*    Cardiac Enzymes  Recent Labs Lab 05/14/16 1703  TROPONINI <0.03    RADIOLOGY:  Ct Head Wo Contrast  Result Date: 05/13/2016 CLINICAL DATA:  Fall with head and right hip injury 2-3 hours prior. EXAM: CT HEAD  WITHOUT CONTRAST CT CERVICAL SPINE WITHOUT CONTRAST TECHNIQUE: Multidetector CT imaging of the head and cervical spine was performed following the standard protocol without intravenous contrast. Multiplanar CT image reconstructions of the cervical spine were also generated. COMPARISON:  12/01/2012 head and cervical spine CT. FINDINGS: CT HEAD FINDINGS Brain: No evidence of parenchymal hemorrhage or extra-axial fluid collection. No mass lesion, mass effect, or midline shift. No CT evidence of acute infarction. Intracranial atherosclerosis. Nonspecific moderate subcortical and periventricular white matter hypodensity, most in keeping with chronic small vessel ischemic change. No ventriculomegaly. Vascular: No hyperdense vessel or unexpected calcification. Skull: No evidence of calvarial fracture. Sinuses/Orbits: The visualized paranasal sinuses are essentially clear. Other:  The mastoid air cells are unopacified. CT CERVICAL SPINE FINDINGS Alignment: Reversal of the normal cervical lordosis. No subluxation. Dens is well positioned between the lateral masses of C1. Skull base and vertebrae: No acute fracture. No primary bone lesion or focal pathologic process. Soft tissues and spinal canal: No prevertebral fluid or swelling. No visible canal hematoma. Disc levels: Mild-to-moderate spondylosis in the lower cervical spine, most prominent at C6-7, not appreciably changed. Mild bilateral facet arthropathy. No significant bony foraminal stenosis. Upper chest: Partially visualized irregular apical left upper lobe pulmonary nodule measuring at least 2.1 cm (series 6/ image 75), not appreciated on 10/23/2014 PET-CT study. Apical 0.7 cm right upper lobe pulmonary nodule (series 6/ image 67), not definitely seen on 10/23/2014 PET-CT. Centrilobular emphysema at the lung apices. Partially visualized left subclavian pacer leads. Other: Visualized mastoid air cells appear clear. No discrete thyroid nodules. No pathologically  enlarged cervical nodes. Extracranial right carotid artery stent. IMPRESSION: 1. No evidence of acute intracranial abnormality. No evidence of calvarial fracture. 2. Moderate chronic small vessel ischemia. 3. No cervical spine fracture or subluxation. 4. Mild degenerative changes in the cervical spine as described . 5. **An incidental finding of potential clinical significance has been found. Partially visualized irregular apical left upper lobe pulmonary nodule measuring at least 2.1 cm. Apical 0.7 cm right upper lobe pulmonary nodule. These nodules were not definitely seen on 10/23/2014 PET-CT study, and primary bronchogenic carcinoma cannot be excluded. Dedicated chest CT is recommended for further evaluation.** Electronically Signed   By:  Ilona Sorrel M.D.   On: 05/13/2016 18:18   Ct Cervical Spine Wo Contrast  Result Date: 05/13/2016 CLINICAL DATA:  Fall with head and right hip injury 2-3 hours prior. EXAM: CT HEAD WITHOUT CONTRAST CT CERVICAL SPINE WITHOUT CONTRAST TECHNIQUE: Multidetector CT imaging of the head and cervical spine was performed following the standard protocol without intravenous contrast. Multiplanar CT image reconstructions of the cervical spine were also generated. COMPARISON:  12/01/2012 head and cervical spine CT. FINDINGS: CT HEAD FINDINGS Brain: No evidence of parenchymal hemorrhage or extra-axial fluid collection. No mass lesion, mass effect, or midline shift. No CT evidence of acute infarction. Intracranial atherosclerosis. Nonspecific moderate subcortical and periventricular white matter hypodensity, most in keeping with chronic small vessel ischemic change. No ventriculomegaly. Vascular: No hyperdense vessel or unexpected calcification. Skull: No evidence of calvarial fracture. Sinuses/Orbits: The visualized paranasal sinuses are essentially clear. Other:  The mastoid air cells are unopacified. CT CERVICAL SPINE FINDINGS Alignment: Reversal of the normal cervical lordosis. No  subluxation. Dens is well positioned between the lateral masses of C1. Skull base and vertebrae: No acute fracture. No primary bone lesion or focal pathologic process. Soft tissues and spinal canal: No prevertebral fluid or swelling. No visible canal hematoma. Disc levels: Mild-to-moderate spondylosis in the lower cervical spine, most prominent at C6-7, not appreciably changed. Mild bilateral facet arthropathy. No significant bony foraminal stenosis. Upper chest: Partially visualized irregular apical left upper lobe pulmonary nodule measuring at least 2.1 cm (series 6/ image 75), not appreciated on 10/23/2014 PET-CT study. Apical 0.7 cm right upper lobe pulmonary nodule (series 6/ image 67), not definitely seen on 10/23/2014 PET-CT. Centrilobular emphysema at the lung apices. Partially visualized left subclavian pacer leads. Other: Visualized mastoid air cells appear clear. No discrete thyroid nodules. No pathologically enlarged cervical nodes. Extracranial right carotid artery stent. IMPRESSION: 1. No evidence of acute intracranial abnormality. No evidence of calvarial fracture. 2. Moderate chronic small vessel ischemia. 3. No cervical spine fracture or subluxation. 4. Mild degenerative changes in the cervical spine as described . 5. **An incidental finding of potential clinical significance has been found. Partially visualized irregular apical left upper lobe pulmonary nodule measuring at least 2.1 cm. Apical 0.7 cm right upper lobe pulmonary nodule. These nodules were not definitely seen on 10/23/2014 PET-CT study, and primary bronchogenic carcinoma cannot be excluded. Dedicated chest CT is recommended for further evaluation.** Electronically Signed   By: Ilona Sorrel M.D.   On: 05/13/2016 18:18   Dg Chest Port 1 View  Result Date: 05/15/2016 CLINICAL DATA:  Follow-up infiltrates EXAM: PORTABLE CHEST 1 VIEW COMPARISON:  05/14/2016 FINDINGS: Cardiac shadow remains enlarged. A defibrillator is again seen.  Endotracheal tube is again noted in satisfactory position. Improved aeration is noted in the bases bilaterally. No focal infiltrate or sizable effusion is seen. IMPRESSION: Improved aeration in the bases bilaterally when compared with the prior exam. Electronically Signed   By: Inez Catalina M.D.   On: 05/15/2016 07:17   Dg Chest Port 1 View  Result Date: 05/14/2016 CLINICAL DATA:  Respiratory distress, status post intubation EXAM: PORTABLE CHEST 1 VIEW COMPARISON:  05/13/2016 FINDINGS: Cardiac shadow is again enlarged. Defibrillator is again seen. Endotracheal tube is now seen in satisfactory position at the level of the aortic knob. Stenting in the left innominate vein extending into the SVC is seen and stable. Mild bibasilar atelectasis is noted. No effusion or pneumothorax is noted. IMPRESSION: Endotracheal tube in satisfactory position. Bibasilar atelectasis. Electronically Signed   By: Elta Guadeloupe  Lukens M.D.   On: 05/14/2016 14:20   Dg Chest Portable 1 View  Result Date: 05/13/2016 CLINICAL DATA:  Fall. EXAM: PORTABLE CHEST 1 VIEW COMPARISON:  Radiographs of April 21, 2016. FINDINGS: Stable cardiomegaly. Atherosclerosis of thoracic aorta is noted. Left-sided pacemaker is unchanged in position. No pneumothorax or pleural effusion is noted. No acute pulmonary disease is noted. Bony thorax is unremarkable. IMPRESSION: Aortic atherosclerosis.  No acute cardiopulmonary abnormality seen. Electronically Signed   By: Marijo Conception, M.D.   On: 05/13/2016 18:34   Dg Hip Operative Unilat W Or W/o Pelvis Right  Result Date: 05/14/2016 CLINICAL DATA:  Right hip fracture EXAM: OPERATIVE RIGHT HIP WITH PELVIS COMPARISON:  05/13/2016 FLUOROSCOPY TIME:  Radiation Exposure Index (as provided by the fluoroscopic device): Not available If the device does not provide the exposure index: Fluoroscopy Time:  47 seconds Number of Acquired Images:  3 FINDINGS: Femoral medullary rod with fixation screw proximally is noted.  The fracture fragments are in near anatomic alignment. IMPRESSION: Status post ORIF of proximal right femoral fracture. Electronically Signed   By: Inez Catalina M.D.   On: 05/14/2016 12:00   Dg Hip Unilat W Or Wo Pelvis 2-3 Views Right  Result Date: 05/13/2016 CLINICAL DATA:  Right hip pain after fall. EXAM: DG HIP (WITH OR WITHOUT PELVIS) 2-3V RIGHT COMPARISON:  None. FINDINGS: Moderately displaced fracture is seen involving intertrochanteric region of proximal right femur. Vascular calcifications are noted IMPRESSION: Moderately displaced intertrochanteric fracture of proximal right femur. Electronically Signed   By: Marijo Conception, M.D.   On: 05/13/2016 18:32       --Marda Stalker, MD.  Board Certified in Internal Medicine, Pulmonary Medicine, [place], and Sleep Medicine.  ICU Pager [phone] [facility] Office Number: [phone]  Patricia Pesa, M.D.  Vilinda Boehringer, M.D.  Merton Border, M.D   05/15/2016, 10:33 AM  Critical Care Attestation.  I have personally obtained a history, examined the patient, evaluated laboratory and imaging results, formulated the assessment and plan and placed orders. The Patient requires high complexity decision making for assessment and support, frequent evaluation and titration of therapies, application of advanced monitoring technologies and extensive interpretation of multiple databases. The patient has critical illness that could lead imminently to failure of 1 or more organ systems and requires the highest level of physician preparedness to intervene.  Critical Care Time devoted to patient care services described in this note is 45 minutes and is exclusive of time spent in procedures.

## 2016-05-15 NOTE — Progress Notes (Signed)
Placed on high fowlers position, cuff deflated, suctioned orally and endotracheally and then extubated to 2 lpm O2 Patricia Woodard

## 2016-05-16 ENCOUNTER — Inpatient Hospital Stay: Payer: Medicare Other

## 2016-05-16 ENCOUNTER — Encounter: Payer: Self-pay | Admitting: Specialist

## 2016-05-16 LAB — CBC
HEMATOCRIT: 29.2 % — AB (ref 35.0–47.0)
Hemoglobin: 10.1 g/dL — ABNORMAL LOW (ref 12.0–16.0)
MCH: 35.7 pg — ABNORMAL HIGH (ref 26.0–34.0)
MCHC: 34.6 g/dL (ref 32.0–36.0)
MCV: 103.1 fL — ABNORMAL HIGH (ref 80.0–100.0)
Platelets: 76 10*3/uL — ABNORMAL LOW (ref 150–440)
RBC: 2.83 MIL/uL — ABNORMAL LOW (ref 3.80–5.20)
RDW: 15.4 % — AB (ref 11.5–14.5)
WBC: 5.6 10*3/uL (ref 3.6–11.0)

## 2016-05-16 LAB — BASIC METABOLIC PANEL
Anion gap: 13 (ref 5–15)
BUN: 47 mg/dL — ABNORMAL HIGH (ref 6–20)
CALCIUM: 7.8 mg/dL — AB (ref 8.9–10.3)
CO2: 20 mmol/L — AB (ref 22–32)
CREATININE: 7.47 mg/dL — AB (ref 0.44–1.00)
Chloride: 87 mmol/L — ABNORMAL LOW (ref 101–111)
GFR calc Af Amer: 5 mL/min — ABNORMAL LOW (ref >60)
GFR calc non Af Amer: 4 mL/min — ABNORMAL LOW (ref >60)
Glucose, Bld: 74 mg/dL (ref 65–99)
Potassium: 4.9 mmol/L (ref 3.5–5.1)
SODIUM: 120 mmol/L — AB (ref 135–145)

## 2016-05-16 LAB — RENAL FUNCTION PANEL
Albumin: 2.5 g/dL — ABNORMAL LOW (ref 3.5–5.0)
Anion gap: 13 (ref 5–15)
BUN: 46 mg/dL — AB (ref 6–20)
CALCIUM: 7.9 mg/dL — AB (ref 8.9–10.3)
CO2: 20 mmol/L — AB (ref 22–32)
CREATININE: 7.51 mg/dL — AB (ref 0.44–1.00)
Chloride: 86 mmol/L — ABNORMAL LOW (ref 101–111)
GFR calc non Af Amer: 4 mL/min — ABNORMAL LOW (ref >60)
GFR, EST AFRICAN AMERICAN: 5 mL/min — AB (ref >60)
GLUCOSE: 74 mg/dL (ref 65–99)
Phosphorus: 5.3 mg/dL — ABNORMAL HIGH (ref 2.5–4.6)
Potassium: 5.1 mmol/L (ref 3.5–5.1)
SODIUM: 119 mmol/L — AB (ref 135–145)

## 2016-05-16 MED ORDER — SODIUM CHLORIDE 0.9 % IV SOLN
INTRAVENOUS | Status: DC
  Administered 2016-05-16: 22:00:00 via INTRAVENOUS

## 2016-05-16 MED ORDER — SEVELAMER CARBONATE 0.8 G PO PACK
0.8000 g | PACK | Freq: Three times a day (TID) | ORAL | Status: DC
  Administered 2016-05-16 – 2016-05-17 (×3): 0.8 g via ORAL
  Filled 2016-05-16 (×3): qty 1

## 2016-05-16 NOTE — Progress Notes (Addendum)
Report called to [facility] on 1A. Patient transporting to CT and then to rm 135. No tele, CCMD and Elink notified. Patient's family notified of transfer. Patient reported no pain before leaving ICU.   ** Per Dr. Mortimer Fries in morning rounds d/c telemetry and transfer to any med surg. Orders placed. Wilnette Kales

## 2016-05-16 NOTE — Care Management (Signed)
RNCM consult received. PT is recommending SNF. Patient to transfer to floor with continued PT. Patient is also a dialysis patient.

## 2016-05-16 NOTE — Progress Notes (Signed)
Notified Dr.Vaickute of the critical sodium of 119 called from the lab

## 2016-05-16 NOTE — Progress Notes (Signed)
Pre hd assessment  

## 2016-05-16 NOTE — Progress Notes (Signed)
Clinical Social Worker (CSW) contacted patient's daughter [name] and presented bed offers. Daughter chose [facility]. Doug admissions coordinator at [facility] is aware of accepted bed offer. CSW will continue follow and assist as needed.   [employer], LCSW [phone]

## 2016-05-16 NOTE — Progress Notes (Signed)
Post hd vitals 

## 2016-05-16 NOTE — Evaluation (Signed)
Occupational Therapy Evaluation Patient Details Name: Patricia Woodard MRN: [id number] DOB: May 30, 1932 Today's Date: 05/16/2016    History of Present Illness Pt admitted for R femoral fracture secondary to fall at home with R hip ORIF on 11/25 with PWB orders.  Stay complicated by developing acute respiratory failure and required CCU stay with ventilation. Pt recently extubated. Pt with history of ESRD, on HD, CAD, and pacemaker.    Clinical Impression    Pt. Is a 82 y.o. female who fell at home and was admitted to [facility] for a R hip ORIF on 11/25 by Dr Sabra Heck with PWB orders.  Pt. Presents with generalized weakness and stiffness in BUEs and BLEs, pain, limited ROM, limited activity tolerance, and impaired functional mobility for ADLs. Patient could benefit from skilled OT services for ADL and A/E retraining, and functional mobility for ADLs in order to improve overall ADL functioning, and return to PLOF. Pt seen today as a co-tx with PTA to transition from lying in bed to sitting at EOB with assist of both therapists due to very limited mobility and a lot of stiffness and pain.   Pt would also benefit from SNF to continue rehab goals after discharge from hospital.      Follow Up Recommendations  SNF    Equipment Recommendations   (to be determined based on progress)    Recommendations for Other Services       Precautions / Restrictions Precautions Precautions: Fall Precaution Comments: RN Brandi cleared pt to participate in bed level exercises and sitting EOB--order for bedrest and for assistance out of bed in Orders. Restrictions Weight Bearing Restrictions: Yes RLE Weight Bearing: Partial weight bearing      Mobility Bed Mobility                  Transfers                      Balance                                            ADL Overall ADL's : Needs assistance/impaired Eating/Feeding: Set up;Minimal assistance   Grooming: Wash/dry  hands;Wash/dry face;Oral care;Brushing hair;Set up;Moderate assistance           Upper Body Dressing : Set up;Moderate assistance;Cueing for sequencing;Sitting   Lower Body Dressing: Set up;Total assistance Lower Body Dressing Details (indicate cue type and reason): sitting EOB pt is not safe to lean forward to don/doff slippers and pain levels were high based on her grimacing and groaning when assisted to EOB with OT and PT.                 General ADL Comments: Pt was not able to actively state her PLOF but presents iwth a lot of stiffness in UEs and esp LES with signs of muscle wasting and grip strength weak bilaterally  with overall deconditioning and weakness     Vision     Perception     Praxis      Pertinent Vitals/Pain Pain Location: R hip and LLE based on facial grimacing and groaning but pt unable to state how painful with numbers or faces today but says it is sharp Pain Descriptors / Indicators: Operative site guarding;Sharp Pain Intervention(s): Limited activity within patient's tolerance;Repositioned;Ice applied;Premedicated before session     Hand Dominance Right  Extremity/Trunk Assessment Upper Extremity Assessment Upper Extremity Assessment: Generalized weakness   Lower Extremity Assessment Lower Extremity Assessment: Defer to PT evaluation       Communication Communication Communication: HOH (delayed response to questions and HOH)   Cognition Arousal/Alertness: Awake/alert Behavior During Therapy: WFL for tasks assessed/performed Overall Cognitive Status: Within Functional Limits for tasks assessed       Memory: Decreased short-term memory             General Comments       Exercises       Shoulder Instructions      Home Living Family/patient expects to be discharged to:: Private residence Living Arrangements: Children Available Help at Discharge: Family;Available 24 hours/day Type of Home: House Home Access: Ramped  entrance     Home Layout: One level               Home Equipment: Walker - 2 wheels;Wheelchair - manual          Prior Functioning/Environment Level of Independence: Independent with assistive device(s)        Comments: was able to ambulate house hold distances using RW and was indep with all ADLs        OT Problem List: Decreased strength;Decreased range of motion;Decreased activity tolerance;Pain;Impaired balance (sitting and/or standing);Decreased cognition;Decreased safety awareness   OT Treatment/Interventions: Self-care/ADL training;DME and/or AE instruction;Balance training;Therapeutic activities;Patient/family education    OT Goals(Current goals can be found in the care plan section) Acute Rehab OT Goals Patient Stated Goal: "to get my strength back again" OT Goal Formulation: With patient Time For Goal Achievement: 05/30/16 Potential to Achieve Goals: Good ADL Goals Pt Will Perform Upper Body Dressing: with supervision;sitting (sitting EOB with no LOB during task) Pt Will Perform Lower Body Dressing: with mod assist;with adaptive equipment;sit to/from stand (using FWW, PWB RLE and no LOB) Pt Will Transfer to Toilet: with mod assist;stand pivot transfer;bedside commode (BSC over toilet and using FWW)  OT Frequency: Min 1X/week   Barriers to D/C:            Co-evaluation PT/OT/SLP Co-Evaluation/Treatment: Yes Reason for Co-Treatment: Complexity of the patient's impairments (multi-system involvement);For patient/therapist safety   OT goals addressed during session: ADL's and self-care      End of Session Nurse Communication:  (for clearance to participate in therapy due to low sodium and order for bed rest and OOB with assist)  Activity Tolerance: Patient limited by fatigue;Patient limited by pain Patient left: in bed;with call bell/phone within reach;with bed alarm set   Time: 1050-1110 OT Time Calculation (min): 20 min Charges:  OT General  Charges $OT Visit: 1 Procedure OT Evaluation $OT Eval Moderate Complexity: 1 Procedure OT Treatments $Self Care/Home Management : 8-22 mins G-Codes:    Chrys Racer, OTR/L ascom [phone] 05/16/16, 11:50 AM

## 2016-05-16 NOTE — Progress Notes (Signed)
Start of hd 

## 2016-05-16 NOTE — Progress Notes (Signed)
Subjective: 2 Days Post-Op Procedure(s) (LRB): OPEN REDUCTION INTERNAL FIXATION HIP (Right)    Patient reports pain as mild. Back on Ortho floor.  Alert and oriented.  hgb stable.  Objective:   VITALS:   Vitals:   05/16/16 1803 05/16/16 1807  BP: (!) 130/57 (!) 141/56  Pulse: 61 64  Resp: 15 15  Temp: 98 F (36.7 C)     Neurologically intact ABD soft Neurovascular intact Sensation intact distally Intact pulses distally Dorsiflexion/Plantar flexion intact Incision: scant drainage  LABS  Recent Labs  05/14/16 1703 05/15/16 0651 05/16/16 0436  HGB 10.5* 10.1* 10.1*  HCT 33.4* 30.0* 29.2*  WBC 6.5 6.7 5.6  PLT 78* 94* 76*     Recent Labs  05/14/16 1703 05/16/16 0436 05/16/16 0631  NA 131* 120* 119*  K 5.1 4.9 5.1  BUN 38* 47* 46*  CREATININE 5.91* 7.47* 7.51*  GLUCOSE 142* 74 74     Recent Labs  05/13/16 2116  INR 1.10     Assessment/Plan: 2 Days Post-Op Procedure(s) (LRB): OPEN REDUCTION INTERNAL FIXATION HIP (Right)   Advance diet Up with therapy Discharge to SNF

## 2016-05-16 NOTE — Progress Notes (Signed)
Physical Therapy Treatment Patient Details Name: Patricia Woodard MRN: [id number] DOB: 02-15-1932 Today's Date: 05/16/2016    History of Present Illness Pt admitted for R femoral fracture secondary to fall at home with R hip ORIF on 11/25 with PWB orders.  Stay complicated by developing acute respiratory failure and required CCU stay with ventilation. Pt recently extubated. Pt with history of ESRD, on HD, CAD, and pacemaker.     PT Comments    Pt agreeable to PT although fatigued from day's events. Pt continues with right hip pain and pain with movement of Bilateral lower extremities this afternoon. Pt does demonstrate improved quad set this afternoon to 1/5. Pt also able to tolerate increased repetitions. Family educated on lower extremity exercises and positioning of lower extremities. Continue PT to progress strength, endurance and up in bed/out of bed activities to improve functional mobility.   Follow Up Recommendations  SNF     Equipment Recommendations       Recommendations for Other Services       Precautions / Restrictions Precautions Precautions: Fall;Other (comment) (HOB to 30 degrees elevation) Restrictions Weight Bearing Restrictions: Yes RLE Weight Bearing: Partial weight bearing    Mobility  Bed Mobility Overal bed mobility: Needs Assistance Bed Mobility: Supine to Sit;Sit to Supine     Supine to sit: Max assist;+2 for physical assistance Sit to supine: Max assist;+2 for physical assistance   General bed mobility comments: Not tested due to pt fatigue/refusal  Transfers                 General transfer comment: Unable at this time  Ambulation/Gait                 Stairs            Wheelchair Mobility    Modified Rankin (Stroke Patients Only)       Balance Overall balance assessment: Needs assistance Sitting-balance support: Bilateral upper extremity supported;Feet supported Sitting balance-Leahy Scale: Fair (fair (-))    Postural control: Other (comment) (forward flexed)                          Cognition Arousal/Alertness: Awake/alert (Fatigued) Behavior During Therapy: WFL for tasks assessed/performed Overall Cognitive Status: Within Functional Limits for tasks assessed       Memory: Decreased short-term memory              Exercises General Exercises - Lower Extremity Ankle Circles/Pumps: AAROM;Both;Supine;20 reps Quad Sets: Strengthening;Supine;Both;15 reps;Other (comment) (improved on R p.m session) Short Arc Quad: AAROM;Both;20 reps;Supine Long Arc Quad: AAROM;Both;10 reps;Seated (minimal ability on R) Heel Slides: Supine;Both;20 reps;AAROM Hip ABduction/ADduction: PROM;Both;Supine;20 reps Straight Leg Raises: PROM;Both;10 reps;Supine Hip Flexion/Marching: Other (comment) (attempted unable ) Other Exercises Other Exercises: education provided to family regarding exercises and positioning    General Comments        Pertinent Vitals/Pain Pain Assessment: Faces Faces Pain Scale: Hurts whole lot Pain Location: RLE; grimaces with pain with LLE movement as well Pain Intervention(s): Limited activity within patient's tolerance;Monitored during session    Home Living                      Prior Function            PT Goals (current goals can now be found in the care plan section) Progress towards PT goals: Progressing toward goals    Frequency    BID  PT Plan Current plan remains appropriate    Co-evaluation             End of Session Equipment Utilized During Treatment: Oxygen Activity Tolerance: Patient limited by fatigue;Other (comment) (weakness) Patient left: in bed;with bed alarm set;with SCD's reapplied;Other (comment);with family/visitor present (HOB 30 degrees)     Time: [id number] PT Time Calculation (min) (ACUTE ONLY): 25 min  Charges:  $Therapeutic Exercise: 23-37 mins $Therapeutic Activity: 8-22 mins                    G  CodesLarae Grooms, PTA 05/16/2016, 3:56 PM

## 2016-05-16 NOTE — Progress Notes (Signed)
Pre hd info 

## 2016-05-16 NOTE — Progress Notes (Signed)
Physical Therapy Treatment Patient Details Name: Patricia Woodard MRN: [id number] DOB: 27-Sep-1931 Today's Date: 05/16/2016    History of Present Illness Pt admitted for R femoral fracture secondary to fall at home with R hip ORIF on 11/25 with PWB orders.  Stay complicated by developing acute respiratory failure and required CCU stay with ventilation. Pt recently extubated. Pt with history of ESRD, on HD, CAD, and pacemaker.     PT Comments    Nursing contacted prior to treatment to clarify activity orders; nursing states pt can participate with PT and up in/out of bed as able. Pt agreeable to PT. Co treat with OT for 20 minutes of session. Pt with profound weakness throughout; stiffness in Bilateral lower extremities with pain noted with movement of Bilateral lower extremities. No quad set noted with attempt on right. Supine and seated exercises with heavy assist. Max x 2 for bed mobility. Pt fatigued post session. Plan to see pt this afternoon for continued progress on strength, range of motion and endurance to improve all functional mobility.   Follow Up Recommendations  SNF     Equipment Recommendations       Recommendations for Other Services       Precautions / Restrictions Precautions Precautions: Fall;Other (comment) (HOB to 30 degrees elevation) Precaution Comments: RN Brandi cleared pt to participate in bed level exercises and sitting EOB--order for bedrest and for assistance out of bed in Orders. Restrictions Weight Bearing Restrictions: Yes RLE Weight Bearing: Partial weight bearing    Mobility  Bed Mobility Overal bed mobility: Needs Assistance Bed Mobility: Supine to Sit;Sit to Supine     Supine to sit: Max assist;+2 for physical assistance Sit to supine: Max assist;+2 for physical assistance   General bed mobility comments: Assist for LEs and trunk; significant weakness throughout   Transfers                 General transfer comment: Unable at this  time  Ambulation/Gait                 Stairs            Wheelchair Mobility    Modified Rankin (Stroke Patients Only)       Balance Overall balance assessment: Needs assistance Sitting-balance support: Bilateral upper extremity supported;Feet supported Sitting balance-Leahy Scale: Fair (fair (-))   Postural control: Other (comment) (forward flexed)                          Cognition Arousal/Alertness: Awake/alert Behavior During Therapy: WFL for tasks assessed/performed Overall Cognitive Status: Within Functional Limits for tasks assessed       Memory: Decreased short-term memory              Exercises General Exercises - Lower Extremity Ankle Circles/Pumps: AAROM;Both;Supine;15 reps Quad Sets: Strengthening;Left;10 reps;Supine;Other (comment) (unable to demonstrate on R) Long Arc Quad: AAROM;Both;10 reps;Seated (minimal ability on R) Heel Slides: PROM;Right;Supine;15 reps (AAROM on the L) Hip ABduction/ADduction: PROM;Both;15 reps;Supine Hip Flexion/Marching: Other (comment) (attempted unable ) Other Exercises Other Exercises: sitting tolerance    General Comments        Pertinent Vitals/Pain Pain Assessment: Faces Faces Pain Scale: Hurts whole lot Pain Location: RLE; grimaces with pain with LLE movement as well Pain Descriptors / Indicators: Operative site guarding;Sharp Pain Intervention(s): Limited activity within patient's tolerance;Monitored during session;Repositioned    Home Living Family/patient expects to be discharged to:: Private residence Living Arrangements: Children Available Help at  Discharge: Family;Available 24 hours/day Type of Home: House Home Access: Ramped entrance   Home Layout: One level Home Equipment: Walker - 2 wheels;Wheelchair - manual      Prior Function Level of Independence: Independent with assistive device(s)      Comments: was able to ambulate house hold distances using RW and was indep  with all ADLs   PT Goals (current goals can now be found in the care plan section) Acute Rehab PT Goals Patient Stated Goal: "to get my strength back again" Progress towards PT goals: Progressing toward goals (slowly)    Frequency    BID      PT Plan Current plan remains appropriate    Co-evaluation             End of Session Equipment Utilized During Treatment: Oxygen Activity Tolerance: Patient limited by fatigue;Other (comment) (weakness) Patient left: in bed;with bed alarm set;with SCD's reapplied;Other (comment) (HOB 30 degrees)     Time: 1025-1110 (20 min co treat with OT) PT Time Calculation (min) (ACUTE ONLY): 45 min  Charges:  $Therapeutic Exercise: 8-22 mins $Therapeutic Activity: 8-22 mins                    G CodesLarae Grooms, PTA 05/16/2016, 12:55 PM

## 2016-05-16 NOTE — Plan of Care (Signed)
Problem: SLP Dysphagia Goals Goal: Misc Dysphagia Goal Pt will safely tolerate po diet of least restrictive consistency w/ no overt s/s of aspiration noted by Staff/pt/family x3 sessions.    

## 2016-05-16 NOTE — Progress Notes (Signed)
  End of hd 

## 2016-05-16 NOTE — Evaluation (Signed)
Clinical/Bedside Swallow Evaluation Patient Details  Name: Patricia Woodard MRN: [id number] Date of Birth: [birth year]  Today's Date: 05/16/2016 Time: SLP Start Time (ACUTE ONLY): 0845 SLP Stop Time (ACUTE ONLY): 0945 SLP Time Calculation (min) (ACUTE ONLY): 60 min  Past Medical History:  Past Medical History:  Diagnosis Date  . Arrhythmia   . Cancer ([place])    breast ca - left, kidney  . Dialysis AV fistula infection ([place])    Left arm Mon, Wed, Fri dialysis  . Hypertension   . MI, old   . Renal insufficiency    dialysis   Past Surgical History:  Past Surgical History:  Procedure Laterality Date  . APPENDECTOMY    . AV FISTULA PLACEMENT Left   . CHOLECYSTECTOMY    . HIP FRACTURE SURGERY Left   . ICD LEAD REMOVAL N/A 11/04/2014   Procedure: ICD LEAD REMOVAL;  Surgeon: Isaias Cowman, MD;  Location: [facility] ORS;  Service: Cardiovascular;  Laterality: N/A;  . INSERT / REPLACE / REMOVE PACEMAKER    . kidney tumor Right   . ORIF HIP FRACTURE Right 05/14/2016   Procedure: OPEN REDUCTION INTERNAL FIXATION HIP;  Surgeon: Earnestine Leys, MD;  Location: [facility] ORS;  Service: Orthopedics;  Laterality: Right;   HPI:  Pt admitted for R femoral fracture secondary to fall at home. Pt is now POD 1 from ORIF on 11/25.  Stay complicated by developing acute respiratory failure and required CCU stay with ventilation. Pt now extubated this date. Pt currently on bedrest orders, RN aware to modify as necessary. Pt with history of ESRD, on HD, CAD, and pacemaker. HD planned; pt is on a HD schedule of MWF. Pt was orally intubated x2 days w/ extubation on 11.26.17. She presents w/ a congested cough, min-mod confusion - may be impacted by pain medications per NSG.    Assessment / Plan / Recommendation Clinical Impression  Pt appears to present w/ increased risk for aspiration at this time secondary to recent illness w/ Pulmonary decline requiring re-intubation post extubution after hip fx surgery. Pt also  exhibits Cognitive decline and is frequently distracted not answering general orientation questions accurately. Pt has exhibited overt coughing w/ NSG when attempting trials of thin liquids post extubation and presents currently w/ a congested cough at baseline (prior to any po's) - noted cough was only Fair in effort/strength. Pt exhibited no immediate, overt s/s of aspiration w/ trials of Nectar liquids and Purees following aspiration precautions; no declinein Pulmonary status(O2 sats, RR) during/post trials. Pt also appeared to tolerate single trials of ice chips in similar manner. Oral phase appeared wfl w/ the trial consistencies assessed today. adequate A-P transfer and clearing given time. Pt tended to become easily distracted and required redirection. Pt would benefit from a modified diet consistency at this time w/ trials to upgrade as medical/respiratory status' improve next 1-2 days. Recommend aspiration precautions; Meds in Puree. NSG updated; family agreed.    Aspiration Risk  Mild aspiration risk (-Moderate)    Diet Recommendation  Dysphagia 1 w/ Nectar liquids; aspiration precautions; feeding support at meals; reduce distractions.  Medication Administration: Crushed with puree    Other  Recommendations Recommended Consults:  (Dietician consult and f/u) Oral Care Recommendations: Oral care BID;Staff/trained caregiver to provide oral care Other Recommendations: Order thickener from pharmacy;Prohibited food (jello, ice cream, thin soups);Remove water pitcher;Have oral suction available   Follow up Recommendations Skilled Nursing facility (TBD)      Frequency and Duration min 3x week  2 weeks  Prognosis Prognosis for Safe Diet Advancement: Fair (-Good)      Swallow Study   General Date of Onset: 05/13/16 HPI: Pt admitted for R femoral fracture secondary to fall at home. Pt is now POD 1 from ORIF on 11/25.  Stay complicated by developing acute respiratory failure and  required CCU stay with ventilation. Pt now extubated this date. Pt currently on bedrest orders, RN aware to modify as necessary. Pt with history of ESRD, on HD, CAD, and pacemaker. HD planned; pt is on a HD schedule of MWF. Pt was orally intubated x2 days w/ extubation on 11.26.17. She presents w/ a congested cough, min-mod confusion - may be impacted by pain medications per NSG.  Type of Study: Bedside Swallow Evaluation Previous Swallow Assessment: none indicated Diet Prior to this Study: Dysphagia 3 (soft);Thin liquids (per report as pt is missing denitition) Temperature Spikes Noted: No (wbc 5.6) Respiratory Status: Nasal cannula (1 liter) History of Recent Intubation: Yes Length of Intubations (days): 2 days Date extubated: 05/15/16 Behavior/Cognition: Alert;Cooperative;Pleasant mood;Confused;Distractible;Requires cueing Oral Cavity Assessment: Dry Oral Care Completed by SLP: Recent completion by staff Oral Cavity - Dentition: Dentures, top;Dentures, not available;Missing dentition (wears a partial on bottom but not in place currently) Vision: Functional for self-feeding Self-Feeding Abilities: Needs assist;Needs set up;Total assist;Able to feed self Patient Positioning: Upright in bed (used towel roll to support head forward) Baseline Vocal Quality: Low vocal intensity (gravely) Volitional Cough: Congested (Fair in effort and strength;) Volitional Swallow: Able to elicit    Oral/Motor/Sensory Function Overall Oral Motor/Sensory Function: Within functional limits   Ice Chips Ice chips: Within functional limits Presentation: Spoon (fed; 5 trials) Other Comments: no immediate overt s/s of aspiration noted   Thin Liquid Thin Liquid: Not tested Other Comments: pt had earlier coughing/choking w/ thin liquids w/ NSG; recent extubation; overall weakness - fair cough    Nectar Thick Nectar Thick Liquid: Within functional limits Presentation: Cup;Self Fed;Spoon (assisted; ~2ozs ) Other  Comments: pt required support holding the cup; monitor small sips   Honey Thick Honey Thick Liquid: Not tested   Puree Puree: Within functional limits Presentation: Spoon (fed; 6 trials) Other Comments: adequate A-P transfer and clearing given time; pt tended to become distracted and required redirection   Solid   GO   Solid: Not tested         Orinda Kenner, MS, CCC-SLP  Watson,Katherine 05/16/2016,12:24 PM

## 2016-05-16 NOTE — Progress Notes (Signed)
Post hd assessment 

## 2016-05-16 NOTE — Progress Notes (Signed)
Subjective:  Continues to do well post extubation. She is due for hemodialysis today. She is awake and alert and follows simple commands this a.m.   Objective:  Vital signs in last 24 hours:  Temp:  [97.6 F (36.4 C)-98 F (36.7 C)] 97.7 F (36.5 C) (11/27 0800) Pulse Rate:  [56-120] 59 (11/27 0900) Resp:  [9-21] 13 (11/27 0900) BP: (79-143)/(38-88) 111/52 (11/27 0900) SpO2:  [93 %-100 %] 100 % (11/27 0900) Weight:  [58.3 kg (128 lb 8.5 oz)] 58.3 kg (128 lb 8.5 oz) (11/27 0419)  Weight change: 1.3 kg (2 lb 13.9 oz) Filed Weights   05/14/16 1541 05/15/16 0500 05/16/16 0419  Weight: 57 kg (125 lb 10.6 oz) 57.2 kg (126 lb 1.7 oz) 58.3 kg (128 lb 8.5 oz)    Intake/Output:    Intake/Output Summary (Last 24 hours) at 05/16/16 0938 Last data filed at 05/16/16 0400  Gross per 24 hour  Intake          1309.25 ml  Output                0 ml  Net          1309.25 ml     Physical Exam: General: No acute distress, lying in the bed   HEENT Dry oral mucous membranes   Neck Supple   Pulm/lungs Normal breathing effort, Clear bilateral   CVS/Heart Paced rhythm on cardiac monitor, No rubs   Abdomen:  Soft, nontender   Extremities: No peripheral edema   Neurologic: Alert, able to follow commands   Skin: Warm dry   Access: Left arm AV fistula        Basic Metabolic Panel:   Recent Labs Lab 05/13/16 1736 05/13/16 2116 05/14/16 0648 05/14/16 1703 05/15/16 0651 05/16/16 0436  NA 130* 131* 132* 131*  --  120*  K 3.3* 3.6 4.5 5.1  --  4.9  CL 91* 92* 95* 98*  --  87*  CO2 25 26 27  19*  --  20*  GLUCOSE 122* 130* 85 142*  --  74  BUN 31* 34* 35* 38*  --  47*  CREATININE 5.01* 5.27* 5.72* 5.91*  --  7.47*  CALCIUM 8.6* 8.6* 8.4* 7.8*  --  7.8*  MG  --   --   --  2.0 1.8  --   PHOS  --   --   --   --  5.4*  --      CBC:  Recent Labs Lab 05/13/16 1736 05/13/16 2116 05/14/16 0648 05/14/16 1703 05/15/16 0651 05/16/16 0436  WBC 7.1 7.1 5.1 6.5 6.7 5.6  NEUTROABS  4.6 5.6  --   --   --   --   HGB 14.0 13.5 12.4 10.5* 10.1* 10.1*  HCT 41.4 40.5 36.5 33.4* 30.0* 29.2*  MCV 103.3* 103.1* 103.6* 109.7* 105.1* 103.1*  PLT 126* 104* 106* 78* 94* 76*      Microbiology:  Recent Results (from the past 720 hour(s))  Surgical pcr screen     Status: None   Collection Time: 05/13/16 10:41 PM  Result Value Ref Range Status   MRSA, PCR NEGATIVE NEGATIVE Final   Staphylococcus aureus NEGATIVE NEGATIVE Final    Comment:        The Xpert SA Assay (FDA approved for NASAL specimens in patients over 21 years of age), is one component of a comprehensive surveillance program.  Test performance has been validated by [facility] for patients greater than or equal to  62 year old. It is not intended to diagnose infection nor to guide or monitor treatment.     Coagulation Studies:  Recent Labs  05/13/16 2116  LABPROT 14.2  INR 1.10    Urinalysis: No results for input(s): COLORURINE, LABSPEC, PHURINE, GLUCOSEU, HGBUR, BILIRUBINUR, KETONESUR, PROTEINUR, UROBILINOGEN, NITRITE, LEUKOCYTESUR in the last 72 hours.  Invalid input(s): APPERANCEUR    Imaging: Dg Chest Port 1 View  Result Date: 05/15/2016 CLINICAL DATA:  Follow-up infiltrates EXAM: PORTABLE CHEST 1 VIEW COMPARISON:  05/14/2016 FINDINGS: Cardiac shadow remains enlarged. A defibrillator is again seen. Endotracheal tube is again noted in satisfactory position. Improved aeration is noted in the bases bilaterally. No focal infiltrate or sizable effusion is seen. IMPRESSION: Improved aeration in the bases bilaterally when compared with the prior exam. Electronically Signed   By: Inez Catalina M.D.   On: 05/15/2016 07:17   Dg Chest Port 1 View  Result Date: 05/14/2016 CLINICAL DATA:  Respiratory distress, status post intubation EXAM: PORTABLE CHEST 1 VIEW COMPARISON:  05/13/2016 FINDINGS: Cardiac shadow is again enlarged. Defibrillator is again seen. Endotracheal tube is now seen in satisfactory  position at the level of the aortic knob. Stenting in the left innominate vein extending into the SVC is seen and stable. Mild bibasilar atelectasis is noted. No effusion or pneumothorax is noted. IMPRESSION: Endotracheal tube in satisfactory position. Bibasilar atelectasis. Electronically Signed   By: Inez Catalina M.D.   On: 05/14/2016 14:20   Dg Hip Operative Unilat W Or W/o Pelvis Right  Result Date: 05/14/2016 CLINICAL DATA:  Right hip fracture EXAM: OPERATIVE RIGHT HIP WITH PELVIS COMPARISON:  05/13/2016 FLUOROSCOPY TIME:  Radiation Exposure Index (as provided by the fluoroscopic device): Not available If the device does not provide the exposure index: Fluoroscopy Time:  47 seconds Number of Acquired Images:  3 FINDINGS: Femoral medullary rod with fixation screw proximally is noted. The fracture fragments are in near anatomic alignment. IMPRESSION: Status post ORIF of proximal right femoral fracture. Electronically Signed   By: Inez Catalina M.D.   On: 05/14/2016 12:00     Medications:   . phenylephrine (NEO-SYNEPHRINE) Adult infusion 0 mcg/min (05/16/16 0629)   . amiodarone  200 mg Oral Daily  . anastrozole  1 mg Oral Daily  . aspirin EC  325 mg Oral Q breakfast  . chlorhexidine gluconate (MEDLINE KIT)  15 mL Mouth Rinse BID  . ferrous sulfate  325 mg Oral Q breakfast  . heparin  5,000 Units Subcutaneous Q12H  . levothyroxine  100 mcg Oral QAC breakfast  . mouth rinse  15 mL Mouth Rinse QID  . mirtazapine  15 mg Oral QHS  . multivitamin  1 tablet Oral Daily  . pantoprazole  40 mg Oral Daily  . pravastatin  40 mg Oral q1800  . senna  1 tablet Oral BID  . sevelamer carbonate  800 mg Oral TID WC   sodium chloride, acetaminophen **OR** acetaminophen, albuterol, alum & mag hydroxide-simeth, bisacodyl, diphenhydrAMINE, fentaNYL (SUBLIMAZE) injection, HYDROcodone-acetaminophen, magnesium hydroxide, menthol-cetylpyridinium **OR** phenol, metoCLOPramide **OR** metoCLOPramide (REGLAN)  injection, ondansetron **OR** ondansetron (ZOFRAN) IV, sodium phosphate, zolpidem  Assessment/ Plan:  82 y.o. female with hypertension, MI requiring hypothermia treatment, congestive heart failure with EF , dilated cardiomyopathy, ESRD, hyperlipidemia, peripheral vascular disease, renal cell carcinoma s/p radiation therapy (RCC followed at [facility]), rt carotid stent 08/2012, Left hip fracture repair (june 2014), epo ok'd by [facility], h/o breast cancer. Rt hip fracture s/p ORIF 05/14/2016 No [facility] dialysis/ MWF-2. Terressa Koyanagi  1. ESRD -  dialysis orders have been prepared for today and patient will undergo dialysis. We will continue dialysis on MWF schedule.  2. AOCKD - Hemoglobin currently 10.1. Epogen on hold. Patient has underlying pulmonary nodules.  3. Pulmonary nodules - Discussed with Dr Juanell Fairly. We will go ahead and order CT scan of the chest for further evaluation.  4. SHPTH - last phosphorus was 5.4 and acceptable. Continue to monitor bone mineral metabolism parameters.  5. S/P ORIF for rt Hip fracture 11/25 - doing well post op, she will end up needing significant amount of rehabilitation.     LOS: 3 Jovian Lembcke 11/27/20179:38 AM

## 2016-05-16 NOTE — Progress Notes (Signed)
[facility] [place] NAME: Patricia Woodard    MR#:  [id number]  DATE OF BIRTH:  1932/02/09  SUBJECTIVE:  CHIEF COMPLAINT:   Chief Complaint  Patient presents with  . Fall  . Hip Pain   - s/p right hip fracture repair 05/14/16, patient remained on vent due to poor clearance of anaesthetics - now more alert and following commands, Extubated yesterday- Has been hypotensive since the surgery, was briefly on on phenylephrine drip, now discontinued - Hemodialysis today Patient complains of right hip pain  REVIEW OF SYSTEMS:  Review of Systems  Constitutional: Negative for chills, fever and malaise/fatigue.  HENT: Negative for congestion, ear discharge, hearing loss and nosebleeds.   Eyes: Negative for blurred vision and double vision.  Respiratory: Negative for cough, shortness of breath and wheezing.   Cardiovascular: Negative for chest pain, palpitations and leg swelling.  Gastrointestinal: Negative for abdominal pain, constipation, diarrhea, nausea and vomiting.  Genitourinary: Negative for dysuria.  Musculoskeletal: Positive for falls, joint pain and myalgias.  Neurological: Negative for dizziness, sensory change, speech change, focal weakness, seizures and headaches.  Psychiatric/Behavioral: Negative for depression.    Complains of right hip pain  DRUG ALLERGIES:   Allergies  Allergen Reactions  . Penicillins Itching  . Ivp Dye [Iodinated Diagnostic Agents] Rash  . Shellfish Allergy Rash    VITALS:  Blood pressure (!) 135/48, pulse 60, temperature 97.6 F (36.4 C), temperature source Oral, resp. rate 18, height 5\' 7"  (1.702 m), weight 58.3 kg (128 lb 8.5 oz), SpO2 96 %.  PHYSICAL EXAMINATION:  Physical Exam  GENERAL:  82 y.o.-year-old Elderly patient lying in the bed with no acute distress.  EYES: Pupils equal, round, reactive to light and accommodation. No scleral icterus. Extraocular muscles intact.  HEENT: Head atraumatic,  normocephalic. Oropharynx and nasopharynx clear. Intubated, on ventilator NECK:  Supple, no jugular venous distention. No thyroid enlargement, no tenderness.  LUNGS: Normal breath sounds bilaterally, no wheezing, rales,rhonchi or crepitation. No use of accessory muscles of respiration. Diminished bibasilar breath sounds. CARDIOVASCULAR: S1, S2 normal. No  rubs, or gallops. 2/6 systolic murmur is present. ICD noted on the chest wall ABDOMEN: Soft, nontender, nondistended. Bowel sounds present. No organomegaly or mass.  EXTREMITIES: No pedal edema, cyanosis, or clubbing. Right hip dressing. Left forearm AV fistula with good thrill noted. Foley catheter in place. NEUROLOGIC: Cranial nerves II through XII are intact. Muscle strength 5/5 in all extremities except right leg movement due to pain. Sensation difficult to evaluate due to patient's mental status. Gait not checked.  PSYCHIATRIC: The patient is alert , able to answer a few questions yes or no, not able to engage in elaborate discussion. Following commands SKIN: No obvious rash, lesion, or ulcer.    LABORATORY PANEL:   CBC  Recent Labs Lab 05/16/16 0436  WBC 5.6  HGB 10.1*  HCT 29.2*  PLT 76*   ------------------------------------------------------------------------------------------------------------------  Chemistries   Recent Labs Lab 05/13/16 2116  05/15/16 0651  05/16/16 0631  NA 131*  < >  --   < > 119*  K 3.6  < >  --   < > 5.1  CL 92*  < >  --   < > 86*  CO2 26  < >  --   < > 20*  GLUCOSE 130*  < >  --   < > 74  BUN 34*  < >  --   < > 46*  CREATININE 5.27*  < >  --   < >  7.51*  CALCIUM 8.6*  < >  --   < > 7.9*  MG  --   < > 1.8  --   --   AST 106*  --   --   --   --   ALT 35  --   --   --   --   ALKPHOS 211*  --   --   --   --   BILITOT 1.2  --   --   --   --   < > = values in this interval not  displayed. ------------------------------------------------------------------------------------------------------------------  Cardiac Enzymes  Recent Labs Lab 05/14/16 1703  TROPONINI <0.03   ------------------------------------------------------------------------------------------------------------------  RADIOLOGY:  Ct Chest Wo Contrast  Result Date: 05/16/2016 CLINICAL DATA:  Follow-up examination for pulmonary nodules. EXAM: CT CHEST WITHOUT CONTRAST TECHNIQUE: Multidetector CT imaging of the chest was performed following the standard protocol without IV contrast. COMPARISON:  Prior radiograph from 05/13/2016. FINDINGS: Cardiovascular: Moderate to advanced atheromatous plaque throughout the visualized aorta and great vessels. Vascular stent in place within the brachiocephalic vein. Cardiomegaly. Diffuse 3 vessel coronary artery calcifications. Valvular calcifications noted as well. Left-sided pacemaker/ AICD. No pericardial effusion. Mediastinum/Nodes: No obvious mediastinal or hilar adenopathy, although evaluation limited on this noncontrast examination. Esophagus grossly unremarkable. Lungs/Pleura: Emphysematous changes present within the lungs. Superimposed mild diffuse interlobular septal thickening suspected to reflect mild pulmonary interstitial edema. Small bilateral pleural effusions. Associated bibasilar opacities, left greater than right, favored to reflect atelectasis, although superimposed infection/ consolidation not entirely excluded, particularly at the left lung base. Bronchiectatic changes present within the bilateral lower lobes. 15 x 16 mm spiculated nodule present within the medial left upper lobe (series 3, image 22). Additional 12 x 15 mm spiculated nodule present slightly more inferiorly within the left upper lobe (series 3, image 24). On the right, there is a 7 mm nodule at the right lung apex (series 3, image 14). 7 mm right upper lobe nodule more inferiorly (series 3,  image 43). Additional 5 mm right upper lobe nodule (series 3, image 54). Additional somewhat mass like opacity within the posterior right lung base measures approximately 14 x 20 mm (series 3, image 99). 4 mm subpleural right middle lobe nodule noted (series 3, image 94). Somewhat ill-defined pleural-based nodular density along the right minor fissure measures 17 mm (series 3, image 65). These nodules are not definite seen on previous CT from 08/22/2012. The Upper Abdomen: Visualized upper abdomen demonstrates no acute abnormality. Probable nephrolithiasis noted within the partially visualized right kidney. Musculoskeletal: Irregular compression deformity involving the T12 vertebral body grossly similar relative to previous exam. This may be pathologic in nature. There is a somewhat age-indeterminate lucency through the spinous process of T3 (series 6, image 85). Minimal sclerosis at the superior endplate of T4 favored to be related to chronic endplate Schmorl's node at this level. Subacute to chronic fracture of the left lateral sixth rib (series 2, image 65). No other acute osseous abnormality. No other worrisome lytic or blastic osseous lesions. IMPRESSION: 1. Multiple bilateral pulmonary nodules as above, largest of which measures 15 x 16 mm within the left upper lobe. While these are indeterminate, given the spiculated nature of a few of these nodules, findings are concerning for possible underlying malignancy. Multifocal primary bronchogenic carcinoma or possibly nodal metastases could be considered. Further evaluation with dedicated PET-CT recommended with possible followup histologic sampling as indicated. 2. Small bilateral pleural effusions with associated bibasilar opacities, likely atelectasis, although superimposed infection not excluded,  particularly at the left lung base. 3. Cardiomegaly with mild diffuse pulmonary interstitial edema. 4. Emphysema. 5. Acute to subacute nondisplaced fracture of the T3  spinous process. 6. Chronic compression deformity of the T12 vertebral body. This may be pathologic in nature. Electronically Signed   By: Jeannine Boga M.D.   On: 05/16/2016 14:11   Dg Chest Port 1 View  Result Date: 05/15/2016 CLINICAL DATA:  Follow-up infiltrates EXAM: PORTABLE CHEST 1 VIEW COMPARISON:  05/14/2016 FINDINGS: Cardiac shadow remains enlarged. A defibrillator is again seen. Endotracheal tube is again noted in satisfactory position. Improved aeration is noted in the bases bilaterally. No focal infiltrate or sizable effusion is seen. IMPRESSION: Improved aeration in the bases bilaterally when compared with the prior exam. Electronically Signed   By: Inez Catalina M.D.   On: 05/15/2016 07:17    EKG:   Orders placed or performed during the hospital encounter of 05/13/16  . ED EKG  . ED EKG  . EKG 12-Lead  . EKG 12-Lead  . EKG 12-Lead  . EKG 12-Lead    ASSESSMENT AND PLAN:   82 year old female with past medical history significant for chronic atrial fibrillation and arrhythmia status post ICD and pacemaker, CAD status post PCI in remote past, end-stage renal disease on Monday Wednesday Friday hemodialysis, history of breast cancer, hypertension and diastolic heart failure presents to hospital after fall and right hip fracture.  #1 right femur intertrochanteric fracture-secondary to mechanical fall. -s/p surgery 05/14/16, POD #2 today - intubated- extubated Yesterday - On Pain medications. physical therapy and DVT prophylaxis started  - d/c foley -Appreciate orthopedics consult. Physical therapist saw patient in consultation and recommended skilled nursing facility placement for rehabilitation  #2 end-stage renal disease on hemodialysis-on Monday Wednesday Friday dialysis. -Has left forearm AV fistula. Nephrology consulted for the same. -Discontinued her potassium supplements, hemodialysis today  #3 history of atrial fibrillation-rate controlled at this time. Hold  Coreg due to hypotension. Also on amiodarone. -Status post ICD and pacemaker for history of tachyarrhythmias - Not on chronic anticoagulation, discussed by cardiology. restarted aspirin and Plavix at discharge which are held after surgery   #4 chronic diastolic CHF-well compensated at this time. Continue dialysis per schedule -holding cardiac medications  losartan, Imdur, hydralazine and Coreg due to hypotension.  #5 hypertension- Hypotensive since the surgery. Hold all her blood pressure medications including Coreg, Norvasc, Imdur, Lasix, hydralazine and losartan  #6 DVT prophylaxis- start SQ heparin  #7. Hyponatremia, initiate patient on IV fluids, reassess sodium level in the morning       All the records are reviewed and case discussed with Care Management/Social Workerr. Management plans discussed with the patient, family and they are in agreement.  CODE STATUS: Full code  TOTAL TIME TAKING CARE OF THIS PATIENT: 40 minutes.   POSSIBLE D/C TO REHAB IN 2 DAYS, DEPENDING ON CLINICAL CONDITION.   Theodoro Grist M.D on 05/16/2016 at 4:37 PM  Between 7am to 6pm - Pager - [phone]  After 6pm go to [email or web address] - password [email or web address] [facility]  Office  [phone]  CC: Primary care physician; Madelyn Brunner, MD

## 2016-05-17 DIAGNOSIS — N186 End stage renal disease: Secondary | ICD-10-CM

## 2016-05-17 DIAGNOSIS — I959 Hypotension, unspecified: Secondary | ICD-10-CM

## 2016-05-17 DIAGNOSIS — I482 Chronic atrial fibrillation, unspecified: Secondary | ICD-10-CM

## 2016-05-17 DIAGNOSIS — E871 Hypo-osmolality and hyponatremia: Secondary | ICD-10-CM

## 2016-05-17 DIAGNOSIS — Z8781 Personal history of (healed) traumatic fracture: Secondary | ICD-10-CM

## 2016-05-17 DIAGNOSIS — I5032 Chronic diastolic (congestive) heart failure: Secondary | ICD-10-CM

## 2016-05-17 DIAGNOSIS — Z992 Dependence on renal dialysis: Secondary | ICD-10-CM

## 2016-05-17 DIAGNOSIS — R918 Other nonspecific abnormal finding of lung field: Secondary | ICD-10-CM

## 2016-05-17 DIAGNOSIS — Z9889 Other specified postprocedural states: Secondary | ICD-10-CM

## 2016-05-17 DIAGNOSIS — I10 Essential (primary) hypertension: Secondary | ICD-10-CM

## 2016-05-17 LAB — CBC
HCT: 26.2 % — ABNORMAL LOW (ref 35.0–47.0)
Hemoglobin: 8.8 g/dL — ABNORMAL LOW (ref 12.0–16.0)
MCH: 33.9 pg (ref 26.0–34.0)
MCHC: 33.5 g/dL (ref 32.0–36.0)
MCV: 101 fL — AB (ref 80.0–100.0)
PLATELETS: 88 10*3/uL — AB (ref 150–440)
RBC: 2.59 MIL/uL — AB (ref 3.80–5.20)
RDW: 15.7 % — ABNORMAL HIGH (ref 11.5–14.5)
WBC: 4.6 10*3/uL (ref 3.6–11.0)

## 2016-05-17 LAB — HEPATITIS B SURFACE ANTIGEN: HEP B S AG: NEGATIVE

## 2016-05-17 LAB — SODIUM: SODIUM: 136 mmol/L (ref 135–145)

## 2016-05-17 LAB — GLUCOSE, CAPILLARY: Glucose-Capillary: 105 mg/dL — ABNORMAL HIGH (ref 65–99)

## 2016-05-17 LAB — HEPATITIS B SURFACE ANTIBODY, QUANTITATIVE: Hepatitis B-Post: 434.6 m[IU]/mL (ref >9.9)

## 2016-05-17 MED ORDER — ASPIRIN 81 MG PO CHEW
324.0000 mg | CHEWABLE_TABLET | Freq: Every day | ORAL | Status: DC
  Administered 2016-05-17: 324 mg via ORAL
  Filled 2016-05-17: qty 4

## 2016-05-17 MED ORDER — SENNA 8.6 MG PO TABS: 1.0000 | ORAL_TABLET | Freq: Two times a day (BID) | ORAL | 0 refills | Status: AC

## 2016-05-17 MED ORDER — HYDROCODONE-ACETAMINOPHEN 5-325 MG PO TABS: 1.0000 | ORAL_TABLET | Freq: Four times a day (QID) | ORAL | 0 refills | Status: DC | PRN

## 2016-05-17 MED ORDER — FERROUS SULFATE 325 (65 FE) MG PO TABS: 325.0000 mg | ORAL_TABLET | Freq: Every day | ORAL | 3 refills | Status: AC

## 2016-05-17 MED ORDER — ACETAMINOPHEN 325 MG PO TABS: 650.0000 mg | ORAL_TABLET | Freq: Four times a day (QID) | ORAL | 6 refills | Status: AC | PRN

## 2016-05-17 MED ORDER — BISACODYL 10 MG RE SUPP: 10.0000 mg | Freq: Every day | RECTAL | 0 refills | Status: AC | PRN

## 2016-05-17 MED ORDER — PANTOPRAZOLE SODIUM 40 MG PO PACK
40.0000 mg | PACK | Freq: Every day | ORAL | Status: DC
  Administered 2016-05-17: 40 mg
  Filled 2016-05-17: qty 20

## 2016-05-17 MED ORDER — HEPARIN SODIUM (PORCINE) 5000 UNIT/ML IJ SOLN: 5000.0000 [IU] | Freq: Two times a day (BID) | INTRAMUSCULAR | 0 refills | Status: DC

## 2016-05-17 MED ORDER — CHLORHEXIDINE GLUCONATE 0.12% ORAL RINSE (MEDLINE KIT): 15.0000 mL | Freq: Two times a day (BID) | OROMUCOSAL | 0 refills | Status: DC

## 2016-05-17 NOTE — Progress Notes (Signed)
Subjective:  Patient moved to floor care. She completed hemodialysis yesterday.   She appears have a good appetite. Multiple pulmonary nodules noted on chest CT.   Objective:  Vital signs in last 24 hours:  Temp:  [98 F (36.7 C)-98.9 F (37.2 C)] 98.7 F (37.1 C) (11/28 1453) Pulse Rate:  [60-88] 61 (11/28 1453) Resp:  [15-20] 16 (11/28 0815) BP: (103-142)/(44-100) 115/55 (11/28 1453) SpO2:  [94 %-100 %] 97 % (11/28 1453) Weight:  [59 kg (130 lb 1.1 oz)-60 kg (132 lb 4.4 oz)] 60 kg (132 lb 4.4 oz) (11/27 2215)  Weight change: 0.7 kg (1 lb 8.7 oz) Filed Weights   05/16/16 0419 05/16/16 2137 05/16/16 2215  Weight: 58.3 kg (128 lb 8.5 oz) 59 kg (130 lb 1.1 oz) 60 kg (132 lb 4.4 oz)    Intake/Output:    Intake/Output Summary (Last 24 hours) at 05/17/16 1513 Last data filed at 05/17/16 1415  Gross per 24 hour  Intake           808.33 ml  Output                0 ml  Net           808.33 ml     Physical Exam: General: No acute distress, lying in the bed   HEENT Dry oral mucous membranes   Neck Supple   Pulm/lungs Normal breathing effort, Clear bilateral   CVS/Heart S1S2, No rubs   Abdomen:  Soft, nontender   Extremities: No peripheral edema   Neurologic: Alert, able to follow commands   Skin: Warm dry   Access: Left arm AV fistula        Basic Metabolic Panel:   Recent Labs Lab 05/13/16 2116 05/14/16 0648 05/14/16 1703 05/15/16 0651 05/16/16 0436 05/16/16 0631 05/17/16 0337  NA 131* 132* 131*  --  120* 119* 136  K 3.6 4.5 5.1  --  4.9 5.1  --   CL 92* 95* 98*  --  87* 86*  --   CO2 26 27 19*  --  20* 20*  --   GLUCOSE 130* 85 142*  --  74 74  --   BUN 34* 35* 38*  --  47* 46*  --   CREATININE 5.27* 5.72* 5.91*  --  7.47* 7.51*  --   CALCIUM 8.6* 8.4* 7.8*  --  7.8* 7.9*  --   MG  --   --  2.0 1.8  --   --   --   PHOS  --   --   --  5.4*  --  5.3*  --      CBC:  Recent Labs Lab 05/13/16 1736 05/13/16 2116 05/14/16 0648 05/14/16 1703  05/15/16 0651 05/16/16 0436 05/17/16 0337  WBC 7.1 7.1 5.1 6.5 6.7 5.6 4.6  NEUTROABS 4.6 5.6  --   --   --   --   --   HGB 14.0 13.5 12.4 10.5* 10.1* 10.1* 8.8*  HCT 41.4 40.5 36.5 33.4* 30.0* 29.2* 26.2*  MCV 103.3* 103.1* 103.6* 109.7* 105.1* 103.1* 101.0*  PLT 126* 104* 106* 78* 94* 76* 88*      Microbiology:  Recent Results (from the past 720 hour(s))  Surgical pcr screen     Status: None   Collection Time: 05/13/16 10:41 PM  Result Value Ref Range Status   MRSA, PCR NEGATIVE NEGATIVE Final   Staphylococcus aureus NEGATIVE NEGATIVE Final    Comment:  The Xpert SA Assay (FDA approved for NASAL specimens in patients over 51 years of age), is one component of a comprehensive surveillance program.  Test performance has been validated by [facility] for patients greater than or equal to 80 year old. It is not intended to diagnose infection nor to guide or monitor treatment.     Coagulation Studies: No results for input(s): LABPROT, INR in the last 72 hours.  Urinalysis: No results for input(s): COLORURINE, LABSPEC, PHURINE, GLUCOSEU, HGBUR, BILIRUBINUR, KETONESUR, PROTEINUR, UROBILINOGEN, NITRITE, LEUKOCYTESUR in the last 72 hours.  Invalid input(s): APPERANCEUR    Imaging: Ct Chest Wo Contrast  Result Date: 05/16/2016 CLINICAL DATA:  Follow-up examination for pulmonary nodules. EXAM: CT CHEST WITHOUT CONTRAST TECHNIQUE: Multidetector CT imaging of the chest was performed following the standard protocol without IV contrast. COMPARISON:  Prior radiograph from 05/13/2016. FINDINGS: Cardiovascular: Moderate to advanced atheromatous plaque throughout the visualized aorta and great vessels. Vascular stent in place within the brachiocephalic vein. Cardiomegaly. Diffuse 3 vessel coronary artery calcifications. Valvular calcifications noted as well. Left-sided pacemaker/ AICD. No pericardial effusion. Mediastinum/Nodes: No obvious mediastinal or hilar adenopathy,  although evaluation limited on this noncontrast examination. Esophagus grossly unremarkable. Lungs/Pleura: Emphysematous changes present within the lungs. Superimposed mild diffuse interlobular septal thickening suspected to reflect mild pulmonary interstitial edema. Small bilateral pleural effusions. Associated bibasilar opacities, left greater than right, favored to reflect atelectasis, although superimposed infection/ consolidation not entirely excluded, particularly at the left lung base. Bronchiectatic changes present within the bilateral lower lobes. 15 x 16 mm spiculated nodule present within the medial left upper lobe (series 3, image 22). Additional 12 x 15 mm spiculated nodule present slightly more inferiorly within the left upper lobe (series 3, image 24). On the right, there is a 7 mm nodule at the right lung apex (series 3, image 14). 7 mm right upper lobe nodule more inferiorly (series 3, image 43). Additional 5 mm right upper lobe nodule (series 3, image 54). Additional somewhat mass like opacity within the posterior right lung base measures approximately 14 x 20 mm (series 3, image 99). 4 mm subpleural right middle lobe nodule noted (series 3, image 94). Somewhat ill-defined pleural-based nodular density along the right minor fissure measures 17 mm (series 3, image 65). These nodules are not definite seen on previous CT from 08/22/2012. The Upper Abdomen: Visualized upper abdomen demonstrates no acute abnormality. Probable nephrolithiasis noted within the partially visualized right kidney. Musculoskeletal: Irregular compression deformity involving the T12 vertebral body grossly similar relative to previous exam. This may be pathologic in nature. There is a somewhat age-indeterminate lucency through the spinous process of T3 (series 6, image 85). Minimal sclerosis at the superior endplate of T4 favored to be related to chronic endplate Schmorl's node at this level. Subacute to chronic fracture of the  left lateral sixth rib (series 2, image 65). No other acute osseous abnormality. No other worrisome lytic or blastic osseous lesions. IMPRESSION: 1. Multiple bilateral pulmonary nodules as above, largest of which measures 15 x 16 mm within the left upper lobe. While these are indeterminate, given the spiculated nature of a few of these nodules, findings are concerning for possible underlying malignancy. Multifocal primary bronchogenic carcinoma or possibly nodal metastases could be considered. Further evaluation with dedicated PET-CT recommended with possible followup histologic sampling as indicated. 2. Small bilateral pleural effusions with associated bibasilar opacities, likely atelectasis, although superimposed infection not excluded, particularly at the left lung base. 3. Cardiomegaly with mild diffuse pulmonary interstitial edema. 4.  Emphysema. 5. Acute to subacute nondisplaced fracture of the T3 spinous process. 6. Chronic compression deformity of the T12 vertebral body. This may be pathologic in nature. Electronically Signed   By: Jeannine Boga M.D.   On: 05/16/2016 14:11     Medications:   . phenylephrine (NEO-SYNEPHRINE) Adult infusion 0 mcg/min (05/16/16 0629)   . amiodarone  200 mg Oral Daily  . anastrozole  1 mg Oral Daily  . aspirin  324 mg Oral Daily  . chlorhexidine gluconate (MEDLINE KIT)  15 mL Mouth Rinse BID  . ferrous sulfate  325 mg Oral Q breakfast  . heparin  5,000 Units Subcutaneous Q12H  . levothyroxine  100 mcg Oral QAC breakfast  . mouth rinse  15 mL Mouth Rinse QID  . mirtazapine  15 mg Oral QHS  . multivitamin  1 tablet Oral Daily  . pantoprazole  40 mg Oral Daily  . pantoprazole sodium  40 mg Per Tube Daily  . pravastatin  40 mg Oral q1800  . senna  1 tablet Oral BID  . sevelamer carbonate  0.8 g Oral TID WC   sodium chloride, acetaminophen **OR** acetaminophen, albuterol, alum & mag hydroxide-simeth, bisacodyl, diphenhydrAMINE, fentaNYL (SUBLIMAZE)  injection, HYDROcodone-acetaminophen, magnesium hydroxide, menthol-cetylpyridinium **OR** phenol, metoCLOPramide **OR** metoCLOPramide (REGLAN) injection, ondansetron **OR** ondansetron (ZOFRAN) IV, sodium phosphate, zolpidem  Assessment/ Plan:  82 y.o. female with hypertension, MI requiring hypothermia treatment, congestive heart failure with EF , dilated cardiomyopathy, ESRD, hyperlipidemia, peripheral vascular disease, renal cell carcinoma s/p radiation therapy (RCC followed at [facility]), rt carotid stent 08/2012, Left hip fracture repair (june 2014), ep[facility] ok'd by [facility], h/o breast cancer. Rt hip fracture s/p ORIF 05/14/2016 No [facility] dialysis/ MWF-2. Terressa Koyanagi  1. ESRD -  Patient seen and evaluated at bedside.  She completed hemodialysis yesterday.  No acute indication for dialysis today.  We plan for hemodialysis again tomorrow it still here.   2. AOCKD - Hemoglobin currently 10.1. Epogen on hold. Patient has underlying pulmonary nodules.  3. Pulmonary nodules - Discussed with Dr Juanell Fairly previously.  CT chest performed and shows multiple pulmonary nodules.  Recommend additional pulmonary follow up for this issue.    4. SHPTH - phosphorus currently 5.3 and acceptable.  Continue to monitor bone mineral metabolism parameters as an outpatient..  5. S/P ORIF for rt Hip fracture 11/25 - doing well post op, she will end up needing significant amount of rehabilitation.     LOS: 4 Belmira Daley 11/28/20173:13 PM

## 2016-05-17 NOTE — Progress Notes (Signed)
[place] [phone]) notified of patients follow up appointment. Sabra Heck, [place],)

## 2016-05-17 NOTE — Progress Notes (Signed)
Subjective: 3 Days Post-Op Procedure(s) (LRB): OPEN REDUCTION INTERNAL FIXATION HIP (Right)    Patient reports pain as mild. Much more alert and active looking.  D/C today.  RTc 10 days.  PWB right leg  Objective:   VITALS:   Vitals:   05/17/16 0815 05/17/16 0858  BP: (!) 103/44   Pulse: 67 60  Resp: 16   Temp: 98.6 F (37 C)     Neurologically intact ABD soft Neurovascular intact Sensation intact distally Intact pulses distally Dorsiflexion/Plantar flexion intact Incision: no drainage  LABS  Recent Labs  05/15/16 0651 05/16/16 0436 05/17/16 0337  HGB 10.1* 10.1* 8.8*  HCT 30.0* 29.2* 26.2*  WBC 6.7 5.6 4.6  PLT 94* 76* 88*     Recent Labs  05/14/16 1703 05/16/16 0436 05/16/16 0631 05/17/16 0337  NA 131* 120* 119* 136  K 5.1 4.9 5.1  --   BUN 38* 47* 46*  --   CREATININE 5.91* 7.47* 7.51*  --   GLUCOSE 142* 74 74  --     No results for input(s): LABPT, INR in the last 72 hours.   Assessment/Plan: 3 Days Post-Op Procedure(s) (LRB): OPEN REDUCTION INTERNAL FIXATION HIP (Right)   Up with therapy Discharge to SNF

## 2016-05-17 NOTE — Progress Notes (Signed)
Spoke with Nadyne Coombes, [employer] rep at [phone], to notify of non-emergent EMS transport.  Auth notification reference given as [id number].   Service date range good for 90 days starting from 05/17/16.   Geographical gap exception requested to determine if services can be considered at an in-network level.

## 2016-05-17 NOTE — Discharge Summary (Addendum)
[facility] [place] NAME: Patricia Woodard    MR#:  [id number]  DATE OF BIRTH:  23-Jul-1931  DATE OF ADMISSION:  05/13/2016 ADMITTING PHYSICIAN: Earnestine Leys, MD  DATE OF DISCHARGE: No discharge date for patient encounter.  PRIMARY CARE PHYSICIAN: Madelyn Brunner, MD     ADMISSION DIAGNOSIS:  Closed fracture of right hip, initial encounter ([place]) [S72.001A]  DISCHARGE DIAGNOSIS:  Principal Problem:   Closed right hip fracture ([place]) Active Problems:   S/P ORIF (open reduction internal fixation) fracture   Respiratory failure, acute (HCC)   Hypotension   ESRD on dialysis (HCC)   Chronic atrial fibrillation (HCC)   Diastolic CHF, chronic (HCC)   Hyponatremia   Essential hypertension   SECONDARY DIAGNOSIS:   Past Medical History:  Diagnosis Date  . Arrhythmia   . Cancer ([place])    breast ca - left, kidney  . Dialysis AV fistula infection ([place])    Left arm Mon, Wed, Fri dialysis  . Hypertension   . MI, old   . Renal insufficiency    dialysis    .pro HOSPITAL COURSE:   The patient is a 82 year old African-American female with past history significant for history of end-stage renal disease. Dialysis via AV fistula of left arm on Mondays, Wednesdays, Fridays, hypertension, coronary artery disease, breast cancer, permanent pacemaker placement, who presents to the hospital with complaints of right hip pain after fall. X-ray revealed moderately displaced intertrochanteric fracture of proximal right femur. Patient underwent ORIF on 05/14/2016, she had postoperative respiratory failure with hypoxia requiring intubation, which was felt to be due to lingering anesthesia medications. Patient was briefly intubated and extubated next day. She however, required phenylephrine for blood pressure control. With conservative therapy her condition improved, she was seen by physical therapist and recommended skilled nursing facility placement for  rehabilitation. Nephrologist follow patient while in the hospital for her hemodialysis needs. She was felt to be stable to be discharged to skilled nursing facility today. Discussion by problem: #1 right femur intertrochanteric fracture-secondary to mechanical fall. -s/p surgery 05/14/16, POD #3 today - intubated- extubated  - Continue Pain medications. physical therapy and DVT prophylaxis with heparin subcutaneously for 2 weeks -Appreciate orthopedics consult. Physical therapist saw patient in consultation and recommended skilled nursing facility placement for rehabilitation. Patient is to follow-up with orthopedic surgeon as outpatient in about 1-2 weeks after discharge  #2 end-stage renal disease on hemodialysis-on Monday Wednesday Friday dialysis. -Has left forearm AV fistula. Nephrology consulted for the same. Last hemodialysis 27th of November 2017, continue per schedule  #3 history of atrial fibrillation-rate controlled at this time. Hold Coreg as patient's blood pressure remains somewhat low, resume it when blood pressure is back to normal/high,  continue amiodarone. -Status post ICD and pacemaker for history of tachyarrhythmias - Not on chronic anticoagulation, discussed by cardiology. restarting aspirin and Plavix at discharge which were held for surgery   #4 chronic diastolic CHF-well compensated at this time. Continue dialysis per schedule -holding cardiac medications   including Imdur, hydralazine and Coreg due to relative hypotension, resume when blood pressure is normal/high.  #5 hypertension- Hypotensive since the surgery. Holding all her blood pressure medications including Coreg,  Imdur,  hydralazine , resume as outpatient  #6 DVT prophylaxis- continue SQ heparin for 2 weeks  #7. Hyponatremia, resolved on IV fluids, reassess sodium level as outpatient  #8. Pulmonary nodules, get pulmonary consultation as outpatient, will likely need PET scan  DISCHARGE CONDITIONS:  Stable  CONSULTS OBTAINED:  Treatment Team:  Isaias Cowman, MD Murlean Iba, MD Gladstone Lighter, MD  DRUG ALLERGIES:   Allergies  Allergen Reactions  . Penicillins Itching  . Ivp Dye [Iodinated Diagnostic Agents] Rash  . Shellfish Allergy Rash    DISCHARGE MEDICATIONS:   Current Discharge Medication List    START taking these medications   Details  acetaminophen (TYLENOL) 325 MG tablet Take 2 tablets (650 mg total) by mouth every 6 (six) hours as needed for mild pain (or Fever >/= 101). Qty: 60 tablet, Refills: 6    bisacodyl (DULCOLAX) 10 MG suppository Place 1 suppository (10 mg total) rectally daily as needed for moderate constipation. Qty: 12 suppository, Refills: 0    chlorhexidine gluconate, MEDLINE KIT, (PERIDEX) 0.12 % solution 15 mLs by Mouth Rinse route 2 (two) times daily. Qty: 120 mL, Refills: 0    ferrous sulfate 325 (65 FE) MG tablet Take 1 tablet (325 mg total) by mouth daily with breakfast. Qty: 30 tablet, Refills: 3    heparin 5000 UNIT/ML injection Inject 1 mL (5,000 Units total) into the skin every 12 (twelve) hours. Qty: 28 mL, Refills: 0    HYDROcodone-acetaminophen (NORCO/VICODIN) 5-325 MG tablet Take 1-2 tablets by mouth every 6 (six) hours as needed for moderate pain. Qty: 30 tablet, Refills: 0    senna (SENOKOT) 8.6 MG TABS tablet Take 1 tablet (8.6 mg total) by mouth 2 (two) times daily. Qty: 120 each, Refills: 0      CONTINUE these medications which have NOT CHANGED   Details  anastrozole (ARIMIDEX) 1 MG tablet Take 1 mg by mouth daily.    aspirin EC 81 MG tablet Take 81 mg by mouth daily.    Azilsartan Medoxomil 80 MG TABS Take 1 tablet by mouth daily.    carvedilol (COREG) 6.25 MG tablet TAKE ONE TABLET BY MOUTH TWICE DAILY Qty: 180 tablet, Refills: 3    clopidogrel (PLAVIX) 75 MG tablet Take 75 mg by mouth daily.    esomeprazole (NEXIUM) 20 MG capsule Take 20 mg by mouth daily at 12 noon.    furosemide (LASIX) 80  MG tablet TAKE ONE TABLET BY MOUTH EVERY DAY Qty: 90 tablet, Refills: 3    hydrALAZINE (APRESOLINE) 100 MG tablet TAKE ONE TABLET BY MOUTH THREE TIMES DAILY Qty: 270 tablet, Refills: 2    isosorbide mononitrate (IMDUR) 30 MG 24 hr tablet Take 30 mg by mouth daily.    levothyroxine (SYNTHROID, LEVOTHROID) 100 MCG tablet Take 100 mcg by mouth daily before breakfast.    lovastatin (MEVACOR) 40 MG tablet TAKE ONE TABLET BY MOUTH AT BEDTIME Qty: 90 tablet, Refills: 3    mirtazapine (REMERON) 15 MG tablet Take 15 mg by mouth at bedtime.    multivitamin (RENA-VIT) TABS tablet Take 1 tablet by mouth daily. 0.48m    PACERONE 200 MG tablet TAKE ONE TABLET BY MOUTH EVERY DAY Qty: 90 each, Refills: 0    SENSIPAR 30 MG tablet TAKE ONE TABLET BY MOUTH AT DINNER. Qty: 30 each, Refills: 1    sevelamer carbonate (RENVELA) 800 MG tablet Take 800 mg by mouth 3 (three) times daily with meals.    diphenhydrAMINE (BENADRYL) 25 MG tablet Take 25 mg by mouth every 6 (six) hours as needed.       the patient   DISCHARGE INSTRUCTIONS:    The patient is to follow-up with primary care physician and orthopedist surgeon within one week after discharge  If you experience worsening of your admission  symptoms, develop shortness of breath, life threatening emergency, suicidal or homicidal thoughts you must seek medical attention immediately by calling 911 or calling your MD immediately  if symptoms less severe.  You Must read complete instructions/literature along with all the possible adverse reactions/side effects for all the Medicines you take and that have been prescribed to you. Take any new Medicines after you have completely understood and accept all the possible adverse reactions/side effects.   Please note  You were cared for by a hospitalist during your hospital stay. If you have any questions about your discharge medications or the care you received while you were in the hospital after you are  discharged, you can call the unit and asked to speak with the hospitalist on call if the hospitalist that took care of you is not available. Once you are discharged, your primary care physician will handle any further medical issues. Please note that NO REFILLS for any discharge medications will be authorized once you are discharged, as it is imperative that you return to your primary care physician (or establish a relationship with a primary care physician if you do not have one) for your aftercare needs so that they can reassess your need for medications and monitor your lab values.    Today   CHIEF COMPLAINT:   Chief Complaint  Patient presents with  . Fall  . Hip Pain    HISTORY OF PRESENT ILLNESS:  Patricia Woodard  is a 82 y.o. female with a known history of end-stage renal disease. Dialysis via AV fistula of left arm on Mondays, Wednesdays, Fridays, hypertension, coronary artery disease, breast cancer, permanent pacemaker placement, who presents to the hospital with complaints of right hip pain after fall. X-ray revealed moderately displaced intertrochanteric fracture of proximal right femur. Patient underwent ORIF on 05/14/2016, she had postoperative respiratory failure with hypoxia requiring intubation, which was felt to be due to lingering anesthesia medications. Patient was briefly intubated and extubated next day. She however, required phenylephrine for blood pressure control. With conservative therapy her condition improved, she was seen by physical therapist and recommended skilled nursing facility placement for rehabilitation. Nephrologist follow patient while in the hospital for her hemodialysis needs. She was felt to be stable to be discharged to skilled nursing facility today. Discussion by problem: #1 right femur intertrochanteric fracture-secondary to mechanical fall. -s/p surgery 05/14/16, POD #3 today - intubated- extubated  - Continue Pain medications. physical therapy and DVT  prophylaxis with heparin subcutaneously for 2 weeks -Appreciate orthopedics consult. Physical therapist saw patient in consultation and recommended skilled nursing facility placement for rehabilitation. Patient is to follow-up with orthopedic surgeon as outpatient in about 1-2 weeks after discharge  #2 end-stage renal disease on hemodialysis-on Monday Wednesday Friday dialysis. -Has left forearm AV fistula. Nephrology consulted for the same. Last hemodialysis 27th of November 2017, continue per schedule  #3 history of atrial fibrillation-rate controlled at this time. Hold Coreg as patient's blood pressure remains somewhat low, resume it when blood pressure is back to normal/high,  continue amiodarone. -Status post ICD and pacemaker for history of tachyarrhythmias - Not on chronic anticoagulation, discussed by cardiology. restarting aspirin and Plavix at discharge which were held for surgery   #4 chronic diastolic CHF-well compensated at this time. Continue dialysis per schedule -holding cardiac medications   including Imdur, hydralazine and Coreg due to relative hypotension, resume when blood pressure is normal/high.  #5 hypertension- Hypotensive since the surgery. Holding all her blood pressure medications including Coreg,  Imdur,  hydralazine , resume as outpatient  #6 DVT prophylaxis- continue SQ heparin for 2 weeks  #7. Hyponatremia, resolved on IV fluids, reassess sodium level as outpatient  #8. Pulmonary nodules, get pulmonary consultation as outpatient, will likely need PET scan   VITAL SIGNS:  Blood pressure (!) 103/44, pulse 60, temperature 98.6 F (37 C), temperature source Oral, resp. rate 16, height 5' 7"  (1.702 m), weight 60 kg (132 lb 4.4 oz), SpO2 94 %.  I/O:    Intake/Output Summary (Last 24 hours) at 05/17/16 1153 Last data filed at 05/17/16 0439  Gross per 24 hour  Intake           568.33 ml  Output                0 ml  Net           568.33 ml     PHYSICAL EXAMINATION:  GENERAL:  82 y.o.-year-old patient lying in the bed with no acute distress.  EYES: Pupils equal, round, reactive to light and accommodation. No scleral icterus. Extraocular muscles intact.  HEENT: Head atraumatic, normocephalic. Oropharynx and nasopharynx clear.  NECK:  Supple, no jugular venous distention. No thyroid enlargement, no tenderness.  LUNGS: Normal breath sounds bilaterally, no wheezing, rales,rhonchi or crepitation. No use of accessory muscles of respiration.  CARDIOVASCULAR: S1, S2 normal. No murmurs, rubs, or gallops.  ABDOMEN: Soft, non-tender, non-distended. Bowel sounds present. No organomegaly or mass.  EXTREMITIES: No pedal edema, cyanosis, or clubbing.  NEUROLOGIC: Cranial nerves II through XII are intact. Muscle strength 5/5 in all extremities. Sensation intact. Gait not checked.  PSYCHIATRIC: The patient is alert and oriented x 3.  SKIN: No obvious rash, lesion, or ulcer.   DATA REVIEW:   CBC  Recent Labs Lab 05/17/16 0337  WBC 4.6  HGB 8.8*  HCT 26.2*  PLT 88*    Chemistries   Recent Labs Lab 05/13/16 2116  05/15/16 0651  05/16/16 0631 05/17/16 0337  NA 131*  < >  --   < > 119* 136  K 3.6  < >  --   < > 5.1  --   CL 92*  < >  --   < > 86*  --   CO2 26  < >  --   < > 20*  --   GLUCOSE 130*  < >  --   < > 74  --   BUN 34*  < >  --   < > 46*  --   CREATININE 5.27*  < >  --   < > 7.51*  --   CALCIUM 8.6*  < >  --   < > 7.9*  --   MG  --   < > 1.8  --   --   --   AST 106*  --   --   --   --   --   ALT 35  --   --   --   --   --   ALKPHOS 211*  --   --   --   --   --   BILITOT 1.2  --   --   --   --   --   < > = values in this interval not displayed.  Cardiac Enzymes  Recent Labs Lab 05/14/16 1703  TROPONINI <0.03    Microbiology Results  Results for orders placed or performed during the hospital encounter of 05/13/16  Surgical pcr screen  Status: None   Collection Time: 05/13/16 10:41 PM  Result Value Ref  Range Status   MRSA, PCR NEGATIVE NEGATIVE Final   Staphylococcus aureus NEGATIVE NEGATIVE Final    Comment:        The Xpert SA Assay (FDA approved for NASAL specimens in patients over 66 years of age), is one component of a comprehensive surveillance program.  Test performance has been validated by [facility] for patients greater than or equal to 25 year old. It is not intended to diagnose infection nor to guide or monitor treatment.     RADIOLOGY:  Ct Chest Wo Contrast  Result Date: 05/16/2016 CLINICAL DATA:  Follow-up examination for pulmonary nodules. EXAM: CT CHEST WITHOUT CONTRAST TECHNIQUE: Multidetector CT imaging of the chest was performed following the standard protocol without IV contrast. COMPARISON:  Prior radiograph from 05/13/2016. FINDINGS: Cardiovascular: Moderate to advanced atheromatous plaque throughout the visualized aorta and great vessels. Vascular stent in place within the brachiocephalic vein. Cardiomegaly. Diffuse 3 vessel coronary artery calcifications. Valvular calcifications noted as well. Left-sided pacemaker/ AICD. No pericardial effusion. Mediastinum/Nodes: No obvious mediastinal or hilar adenopathy, although evaluation limited on this noncontrast examination. Esophagus grossly unremarkable. Lungs/Pleura: Emphysematous changes present within the lungs. Superimposed mild diffuse interlobular septal thickening suspected to reflect mild pulmonary interstitial edema. Small bilateral pleural effusions. Associated bibasilar opacities, left greater than right, favored to reflect atelectasis, although superimposed infection/ consolidation not entirely excluded, particularly at the left lung base. Bronchiectatic changes present within the bilateral lower lobes. 15 x 16 mm spiculated nodule present within the medial left upper lobe (series 3, image 22). Additional 12 x 15 mm spiculated nodule present slightly more inferiorly within the left upper lobe (series 3, image  24). On the right, there is a 7 mm nodule at the right lung apex (series 3, image 14). 7 mm right upper lobe nodule more inferiorly (series 3, image 43). Additional 5 mm right upper lobe nodule (series 3, image 54). Additional somewhat mass like opacity within the posterior right lung base measures approximately 14 x 20 mm (series 3, image 99). 4 mm subpleural right middle lobe nodule noted (series 3, image 94). Somewhat ill-defined pleural-based nodular density along the right minor fissure measures 17 mm (series 3, image 65). These nodules are not definite seen on previous CT from 08/22/2012. The Upper Abdomen: Visualized upper abdomen demonstrates no acute abnormality. Probable nephrolithiasis noted within the partially visualized right kidney. Musculoskeletal: Irregular compression deformity involving the T12 vertebral body grossly similar relative to previous exam. This may be pathologic in nature. There is a somewhat age-indeterminate lucency through the spinous process of T3 (series 6, image 85). Minimal sclerosis at the superior endplate of T4 favored to be related to chronic endplate Schmorl's node at this level. Subacute to chronic fracture of the left lateral sixth rib (series 2, image 65). No other acute osseous abnormality. No other worrisome lytic or blastic osseous lesions. IMPRESSION: 1. Multiple bilateral pulmonary nodules as above, largest of which measures 15 x 16 mm within the left upper lobe. While these are indeterminate, given the spiculated nature of a few of these nodules, findings are concerning for possible underlying malignancy. Multifocal primary bronchogenic carcinoma or possibly nodal metastases could be considered. Further evaluation with dedicated PET-CT recommended with possible followup histologic sampling as indicated. 2. Small bilateral pleural effusions with associated bibasilar opacities, likely atelectasis, although superimposed infection not excluded, particularly at the left  lung base. 3. Cardiomegaly with mild diffuse pulmonary interstitial edema. 4.  Emphysema. 5. Acute to subacute nondisplaced fracture of the T3 spinous process. 6. Chronic compression deformity of the T12 vertebral body. This may be pathologic in nature. Electronically Signed   By: Jeannine Boga M.D.   On: 05/16/2016 14:11    EKG:   Orders placed or performed during the hospital encounter of 05/13/16  . ED EKG  . ED EKG  . EKG 12-Lead  . EKG 12-Lead  . EKG 12-Lead  . EKG 12-Lead      Management plans discussed with the patient, family and they are in agreement.  CODE STATUS:     Code Status Orders        Start     Ordered   05/14/16 1437  Full code  Continuous     05/14/16 1441    Code Status History    Date Active Date Inactive Code Status Order ID Comments User Context   05/13/2016  8:25 PM 05/14/2016  2:41 PM Full Code [id number]  Demetrios Loll, MD ED   05/13/2016  7:03 PM 05/13/2016  8:25 PM Full Code [id number]  Earnestine Leys, MD ED   11/04/2014  2:43 PM 11/04/2014  6:09 PM Full Code [id number]  Isaias Cowman, MD Inpatient    Advance Directive Documentation   Flowsheet Row Most Recent Value  Type of Advance Directive  Healthcare Power of Attorney, Living will  Pre-existing out of facility DNR order (yellow form or pink MOST form)  No data  "MOST" Form in Place?  No data      TOTAL TIME TAKING CARE OF THIS PATIENT: 40 minutes.    Theodoro Grist M.D on 05/17/2016 at 11:53 AM  Between 7am to 6pm - Pager - [phone]  After 6pm go to [email or web address] - password [email or web address]  Office  [phone]  CC: Primary care physician; Madelyn Brunner, MD

## 2016-05-17 NOTE — Progress Notes (Signed)
Report called to [facility] , EMS called for transportation.

## 2016-05-17 NOTE — Clinical Social Work Placement (Signed)
   CLINICAL SOCIAL WORK PLACEMENT  NOTE  Date:  05/17/2016  Patient Details  Name: Patricia Woodard MRN: [id number] Date of Birth: Dec 31, 1931  Clinical Social Work is seeking post-discharge placement for this patient at the [place] level of care (*CSW will initial, date and re-position this form in  chart as items are completed):  Yes   Patient/family provided with [place] Work Department's list of facilities offering this level of care within the geographic area requested by the patient (or if unable, by the patient's family).  Yes   Patient/family informed of their freedom to choose among providers that offer the needed level of care, that participate in Medicare, Medicaid or managed care program needed by the patient, have an available bed and are willing to accept the patient.  Yes   Patient/family informed of [facility]'s ownership interest in [facility] and [facility], as well as of the fact that they are under no obligation to receive care at these facilities.  PASRR submitted to EDS on       PASRR number received on       Existing PASRR number confirmed on 05/15/16     FL2 transmitted to all facilities in geographic area requested by pt/family on 05/15/16     FL2 transmitted to all facilities within larger geographic area on       Patient informed that his/her managed care company has contracts with or will negotiate with certain facilities, including the following:        Yes   Patient/family informed of bed offers received.  Patient chooses bed at  [facility] )     Physician recommends and patient chooses bed at      Patient to be transferred to  [facility] ) on 05/17/16.  Patient to be transferred to facility by  [facility] )     Patient family notified on 05/17/16 of transfer.  Name of family member notified:   (Patient's daughter Patricia Woodard is aware of D/C today. )     PHYSICIAN        Additional Comment:    _______________________________________________ [name], [name], LCSW 05/17/2016, 1:38 PM

## 2016-05-17 NOTE — NC FL2 (Signed)
[place] LEVEL OF CARE SCREENING TOOL     IDENTIFICATION  Patient Name: Patricia Woodard Birthdate: [birth year] Sex: female Admission Date (Current Location): 05/13/2016  [place] and [place] Number:  Engineering geologist and Address:  [facility], [address], [place], [place] [address]      Provider Number: [id number]  Attending Physician Name and Address:  Theodoro Grist, MD  Relative Name and Phone Number:       Current Level of Care: Hospital Recommended Level of Care: [place] Prior Approval Number:    Date Approved/Denied: 12/06/12 PASRR Number: [id number]  Discharge Plan: SNF    Current Diagnoses: Patient Active Problem List   Diagnosis Date Noted  . S/P ORIF (open reduction internal fixation) fracture 05/17/2016  . Hypotension 05/17/2016  . ESRD on dialysis ([place]) 05/17/2016  . Chronic atrial fibrillation ([place]) 05/17/2016  . Diastolic CHF, chronic ([place]) 05/17/2016  . Hyponatremia 05/17/2016  . Essential hypertension 05/17/2016  . Respiratory failure, acute ([place]) 05/14/2016  . Closed right hip fracture ([place]) 05/13/2016    Orientation RESPIRATION BLADDER Height & Weight     Self, Time, Situation, Place  Normal Continent Weight: 132 lb 4.4 oz (60 kg) Height:  5' 7" (170.2 cm)  BEHAVIORAL SYMPTOMS/MOOD NEUROLOGICAL BOWEL NUTRITION STATUS      Continent    AMBULATORY STATUS COMMUNICATION OF NEEDS Skin   Total Care Verbally Surgical wounds                       Personal Care Assistance Level of Assistance  Bathing, Dressing Bathing Assistance: Limited assistance Feeding assistance: Independent Dressing Assistance: Limited assistance     Functional Limitations Info             SPECIAL CARE FACTORS FREQUENCY  PT (By licensed PT), OT (By licensed OT)     PT Frequency: Up to 5X per day; 5 days per week OT Frequency: Up to 5X per day; 5 days per week            Contractures  Contractures Info: Present    Additional Factors Info  Allergies   Allergies Info: Penicillins, Ivp Dye Iodinated Diagnostic Agents, Shellfish Allergy           Current Medications (05/17/2016):  This is the current hospital active medication list Current Facility-Administered Medications  Medication Dose Route Frequency Provider Last Rate Last Dose  . 0.9 %  sodium chloride infusion  250 mL Intravenous PRN Laverle Hobby, MD      . acetaminophen (TYLENOL) tablet 650 mg  650 mg Oral Q6H PRN Demetrios Loll, MD   650 mg at 05/17/16 4166   Or  . acetaminophen (TYLENOL) suppository 650 mg  650 mg Rectal Q6H PRN Demetrios Loll, MD      . albuterol (PROVENTIL) (2.5 MG/3ML) 0.083% nebulizer solution 2.5 mg  2.5 mg Nebulization Q2H PRN Demetrios Loll, MD      . alum & mag hydroxide-simeth (MAALOX/MYLANTA) 200-200-20 MG/5ML suspension 30 mL  30 mL Oral Q4H PRN Earnestine Leys, MD      . amiodarone (PACERONE) tablet 200 mg  200 mg Oral Daily Demetrios Loll, MD   200 mg at 05/17/16 0944  . anastrozole (ARIMIDEX) tablet 1 mg  1 mg Oral Daily Demetrios Loll, MD   1 mg at 05/17/16 0943  . aspirin chewable tablet 324 mg  324 mg Oral Daily Theodoro Grist, MD   324 mg at 05/17/16 0955  .  bisacodyl (DULCOLAX) suppository 10 mg  10 mg Rectal Daily PRN Earnestine Leys, MD      . chlorhexidine gluconate (MEDLINE KIT) (PERIDEX) 0.12 % solution 15 mL  15 mL Mouth Rinse BID Laverle Hobby, MD   15 mL at 05/16/16 2225  . diphenhydrAMINE (BENADRYL) tablet 25 mg  25 mg Oral Q6H PRN Demetrios Loll, MD      . fentaNYL (SUBLIMAZE) injection 25-50 mcg  25-50 mcg Intravenous Q2H PRN Mikael Spray, NP   25 mcg at 05/16/16 0850  . ferrous sulfate tablet 325 mg  325 mg Oral Q breakfast Earnestine Leys, MD   325 mg at 05/17/16 0943  . heparin injection 5,000 Units  5,000 Units Subcutaneous Q12H Laverle Hobby, MD   5,000 Units at 05/17/16 0944  . HYDROcodone-acetaminophen (NORCO/VICODIN) 5-325 MG per tablet 1-2 tablet  1-2 tablet Oral  Q6H PRN Earnestine Leys, MD   1 tablet at 05/16/16 2219  . levothyroxine (SYNTHROID, LEVOTHROID) tablet 100 mcg  100 mcg Oral QAC breakfast Demetrios Loll, MD   100 mcg at 05/16/16 1028  . magnesium hydroxide (MILK OF MAGNESIA) suspension 30 mL  30 mL Oral Daily PRN Earnestine Leys, MD      . MEDLINE mouth rinse  15 mL Mouth Rinse QID Laverle Hobby, MD   15 mL at 05/17/16 0519  . menthol-cetylpyridinium (CEPACOL) lozenge 3 mg  1 lozenge Oral PRN Earnestine Leys, MD       Or  . phenol (CHLORASEPTIC) mouth spray 1 spray  1 spray Mouth/Throat PRN Earnestine Leys, MD      . metoCLOPramide (REGLAN) tablet 5-10 mg  5-10 mg Oral Q8H PRN Earnestine Leys, MD       Or  . metoCLOPramide (REGLAN) injection 5-10 mg  5-10 mg Intravenous Q8H PRN Earnestine Leys, MD      . mirtazapine (REMERON) tablet 15 mg  15 mg Oral QHS Demetrios Loll, MD   15 mg at 05/16/16 2219  . multivitamin (RENA-VIT) tablet 1 tablet  1 tablet Oral Daily Demetrios Loll, MD   1 tablet at 05/17/16 [phone]  . ondansetron (ZOFRAN) tablet 4 mg  4 mg Oral Q6H PRN Demetrios Loll, MD       Or  . ondansetron [facility]) injection 4 mg  4 mg Intravenous Q6H PRN Demetrios Loll, MD   4 mg at 05/14/16 0002  . pantoprazole (PROTONIX) EC tablet 40 mg  40 mg Oral Daily Theodoro Grist, MD   40 mg at 05/16/16 1029  . pantoprazole sodium (PROTONIX) 40 mg/20 mL oral suspension 40 mg  40 mg Per Tube Daily Theodoro Grist, MD      . phenylephrine (NEO-SYNEPHRINE) 10 mg in dextrose 5 % 250 mL (0.04 mg/mL) infusion  0-400 mcg/min Intravenous Titrated Laverle Hobby, MD 0 mL/hr at 05/16/16 0629 0 mcg/min at 05/16/16 0629  . pravastatin (PRAVACHOL) tablet 40 mg  40 mg Oral q1800 Demetrios Loll, MD   Stopped at 05/14/16 1801  . senna (SENOKOT) tablet 8.6 mg  1 tablet Oral BID Earnestine Leys, MD   8.6 mg at 05/17/16 0943  . sevelamer carbonate (RENVELA) packet 0.8 g  0.8 g Oral TID WC Theodoro Grist, MD   0.8 g at 05/17/16 0943  . sodium phosphate (FLEET) 7-19 GM/118ML enema 1 enema  1 enema Rectal Once  PRN Earnestine Leys, MD      . zolpidem [facility]) tablet 5 mg  5 mg Oral QHS PRN Earnestine Leys, MD  Discharge Medications: Please see discharge summary for a list of discharge medications.  Relevant Imaging Results:  Relevant Lab Results:   Additional Information    , [name], LCSW

## 2016-05-17 NOTE — Progress Notes (Signed)
Patient is medically stable for discharge to [facility] today. Per [facility] admissions coordinator at [facility], patient can come to room 4A. Social work Patent examiner discharge orders in [place]. Social work Theatre manager met with patient at bedside making her aware of discharge to [facility] today. Patient verbally agreed she understood. Social work Theatre manager spoke with patient's daughter [name], making her aware of patient's discharge today. Clinical Education officer, museum (CSW) contacted patient's dialysis clinic [facility] [address] in [place]. CSW sent discharge summary to the dyalisis center. RN will call report and arrange EMS for transport. Please re-consult if future social work needs arise. Social work Architectural technologist off.   Danie Chandler, Social Work Intern  [phone]

## 2016-05-17 NOTE — Progress Notes (Signed)
Physical Therapy Treatment Patient Details Name: Patricia Woodard MRN: [id number] DOB: 1931/08/18 Today's Date: 05/17/2016    History of Present Illness Pt admitted for R femoral fracture secondary to fall at home with R hip ORIF on 11/25 with PWB orders.  Stay complicated by developing acute respiratory failure and required CCU stay with ventilation. Pt recently extubated. Pt with history of ESRD, on HD, CAD, and pacemaker.     PT Comments    Pt mildly lethargic this morning. Reports strong pain in Right lower extremity; pt continues to demonstrate pain in Bilateral lower extremities with movement. Bilateral lower extremities very stiff; pt found each session with bilateral hips/knees in flexed position with pillows under knees and heels not floated. Encouraged improved extended position with bed adjustment and 2 pillows under calves post treatment. Pt continues to require assist for Bilateral lower extremities throughout exercises. Poor quad sets bilaterally today, but improved assist on right with short arc quad. Pt does not wish up in bed/out of bed at this time due to pain and fatigue. Continue treatment this afternoon if pt continues admitted to progress range and strength, endurance to improve up/out of bed functional mobility.   Follow Up Recommendations  SNF     Equipment Recommendations       Recommendations for Other Services       Precautions / Restrictions Precautions Precautions: Fall;Other (comment) Restrictions Weight Bearing Restrictions: Yes RLE Weight Bearing: Partial weight bearing    Mobility  Bed Mobility               General bed mobility comments: Not tested; too fatigued/painful  Transfers                    Ambulation/Gait                 Stairs            Wheelchair Mobility    Modified Rankin (Stroke Patients Only)       Balance                                    Cognition Arousal/Alertness:  Lethargic Behavior During Therapy: WFL for tasks assessed/performed Overall Cognitive Status: Within Functional Limits for tasks assessed                      Exercises General Exercises - Lower Extremity Ankle Circles/Pumps: AROM;Both;Supine;Other (comment);10 reps (20 AAROM for improved range) Quad Sets: Strengthening;Both;10 reps;Supine (poor QS bilaterally) Short Arc Quad: AAROM;Both;20 reps;Supine Heel Slides: AAROM;Both;Supine;20 reps Hip ABduction/ADduction: AAROM;Both;20 reps;Supine Straight Leg Raises: PROM;Both;10 reps;Supine    General Comments        Pertinent Vitals/Pain Pain Assessment: Faces Faces Pain Scale: Hurts whole lot Pain Location: RLE, notes pain with movement of LLE as well Pain Intervention(s): Limited activity within patient's tolerance;Monitored during session;Patient requesting pain meds-RN notified;Other (comment) (daughter notes nursing is bringing pain medication soon)    Home Living                      Prior Function            PT Goals (current goals can now be found in the care plan section) Progress towards PT goals: Not progressing toward goals - comment (slowly)    Frequency    BID      PT Plan Current plan remains appropriate  Co-evaluation             End of Session Equipment Utilized During Treatment: Oxygen Activity Tolerance: Patient limited by pain Patient left: in bed;with call bell/phone within reach;with bed alarm set;with SCD's reapplied;Other (comment) (improved positioning to elevate heels and extend knees)     Time: [id number] PT Time Calculation (min) (ACUTE ONLY): 26 min  Charges:  $Therapeutic Exercise: 23-37 mins                    G Codes:      Larae Grooms, PTA 05/17/2016, 9:24 AM

## 2016-05-17 NOTE — Care Management Important Message (Signed)
Important Message  Patient Details  Name: Patricia Woodard MRN: [id number] Date of Birth: [birth year]   Medicare Important Message Given:  Yes    Jolly Mango, RN 05/17/2016, 1:32 PM

## 2016-05-25 ENCOUNTER — Inpatient Hospital Stay: Payer: Medicare Other | Admitting: Internal Medicine

## 2016-05-26 ENCOUNTER — Inpatient Hospital Stay: Payer: Medicare Other | Admitting: Internal Medicine

## 2016-05-31 ENCOUNTER — Inpatient Hospital Stay: Payer: Medicare Other | Admitting: Internal Medicine

## 2016-06-30 ENCOUNTER — Ambulatory Visit
Admission: RE | Admit: 2016-06-30 | Discharge: 2016-06-30 | Disposition: A | Discharge status: Home / Self Care | Payer: No Typology Code available for payment source | Source: Ambulatory Visit | Attending: Nephrology | Admitting: Nephrology

## 2016-06-30 DIAGNOSIS — I120 Hypertensive chronic kidney disease with stage 5 chronic kidney disease or end stage renal disease: Secondary | ICD-10-CM | POA: Diagnosis present

## 2016-06-30 DIAGNOSIS — I252 Old myocardial infarction: Secondary | ICD-10-CM | POA: Diagnosis present

## 2016-06-30 DIAGNOSIS — I509 Heart failure, unspecified: Secondary | ICD-10-CM | POA: Diagnosis present

## 2016-06-30 LAB — PREPARE RBC (CROSSMATCH)

## 2016-06-30 MED ORDER — ACETAMINOPHEN 325 MG PO TABS: 650.0000 mg | ORAL_TABLET | ORAL | Status: DC | PRN

## 2016-06-30 MED ORDER — DIPHENHYDRAMINE HCL 50 MG/ML IJ SOLN
25.0000 mg | Freq: Once | INTRAMUSCULAR | Status: AC
  Administered 2016-06-30: 25 mg via INTRAVENOUS

## 2016-06-30 MED ORDER — SODIUM CHLORIDE 0.9 % IV SOLN
Freq: Once | INTRAVENOUS | Status: AC
  Administered 2016-06-30: 08:00:00 via INTRAVENOUS

## 2016-06-30 MED ORDER — DIPHENHYDRAMINE HCL 50 MG/ML IJ SOLN
INTRAMUSCULAR | Status: AC
  Administered 2016-06-30: 25 mg via INTRAVENOUS
  Filled 2016-06-30: qty 1

## 2016-06-30 MED ORDER — HYDROCODONE-ACETAMINOPHEN 5-325 MG PO TABS
1.0000 | ORAL_TABLET | Freq: Four times a day (QID) | ORAL | Status: DC | PRN
  Administered 2016-06-30: 1 via ORAL

## 2016-06-30 MED ORDER — HYDROCODONE-ACETAMINOPHEN 5-325 MG PO TABS
ORAL_TABLET | ORAL | Status: AC
  Filled 2016-06-30: qty 1

## 2016-07-01 LAB — TYPE AND SCREEN
ABO/RH(D): O POS
Antibody Screen: NEGATIVE
Unit division: 0

## 2016-07-12 ENCOUNTER — Inpatient Hospital Stay: Payer: Medicare Other | Admitting: Oncology

## 2016-07-26 ENCOUNTER — Ambulatory Visit: Payer: Medicare Other | Admitting: Oncology

## 2016-08-04 ENCOUNTER — Inpatient Hospital Stay: Payer: Medicare Other | Attending: Oncology | Admitting: Oncology

## 2016-08-04 ENCOUNTER — Inpatient Hospital Stay: Payer: Medicare Other

## 2016-08-04 ENCOUNTER — Encounter: Payer: Self-pay | Admitting: Oncology

## 2016-08-04 VITALS — BP 141/66 | HR 61 | Temp 95.2°F | Resp 18 | Ht 65.0 in | Wt 126.1 lb

## 2016-08-04 DIAGNOSIS — Z853 Personal history of malignant neoplasm of breast: Secondary | ICD-10-CM | POA: Diagnosis not present

## 2016-08-04 DIAGNOSIS — R5383 Other fatigue: Secondary | ICD-10-CM | POA: Diagnosis not present

## 2016-08-04 DIAGNOSIS — Z85528 Personal history of other malignant neoplasm of kidney: Secondary | ICD-10-CM | POA: Insufficient documentation

## 2016-08-04 DIAGNOSIS — I420 Dilated cardiomyopathy: Secondary | ICD-10-CM | POA: Diagnosis not present

## 2016-08-04 DIAGNOSIS — Z992 Dependence on renal dialysis: Secondary | ICD-10-CM | POA: Insufficient documentation

## 2016-08-04 DIAGNOSIS — I252 Old myocardial infarction: Secondary | ICD-10-CM | POA: Insufficient documentation

## 2016-08-04 DIAGNOSIS — I132 Hypertensive heart and chronic kidney disease with heart failure and with stage 5 chronic kidney disease, or end stage renal disease: Secondary | ICD-10-CM | POA: Diagnosis not present

## 2016-08-04 DIAGNOSIS — Z7982 Long term (current) use of aspirin: Secondary | ICD-10-CM | POA: Diagnosis not present

## 2016-08-04 DIAGNOSIS — Z880 Allergy status to penicillin: Secondary | ICD-10-CM | POA: Diagnosis not present

## 2016-08-04 DIAGNOSIS — R918 Other nonspecific abnormal finding of lung field: Secondary | ICD-10-CM | POA: Diagnosis not present

## 2016-08-04 DIAGNOSIS — R531 Weakness: Secondary | ICD-10-CM | POA: Diagnosis not present

## 2016-08-04 DIAGNOSIS — Z9012 Acquired absence of left breast and nipple: Secondary | ICD-10-CM | POA: Diagnosis not present

## 2016-08-04 DIAGNOSIS — D649 Anemia, unspecified: Secondary | ICD-10-CM

## 2016-08-04 DIAGNOSIS — Z87891 Personal history of nicotine dependence: Secondary | ICD-10-CM

## 2016-08-04 DIAGNOSIS — Z170 Estrogen receptor positive status [ER+]: Secondary | ICD-10-CM

## 2016-08-04 DIAGNOSIS — I5032 Chronic diastolic (congestive) heart failure: Secondary | ICD-10-CM | POA: Diagnosis not present

## 2016-08-04 DIAGNOSIS — D539 Nutritional anemia, unspecified: Secondary | ICD-10-CM

## 2016-08-04 DIAGNOSIS — Z79899 Other long term (current) drug therapy: Secondary | ICD-10-CM | POA: Insufficient documentation

## 2016-08-04 DIAGNOSIS — Z95810 Presence of automatic (implantable) cardiac defibrillator: Secondary | ICD-10-CM | POA: Diagnosis not present

## 2016-08-04 DIAGNOSIS — N186 End stage renal disease: Secondary | ICD-10-CM | POA: Insufficient documentation

## 2016-08-04 DIAGNOSIS — M810 Age-related osteoporosis without current pathological fracture: Secondary | ICD-10-CM | POA: Insufficient documentation

## 2016-08-04 DIAGNOSIS — I482 Chronic atrial fibrillation: Secondary | ICD-10-CM

## 2016-08-04 DIAGNOSIS — Z8673 Personal history of transient ischemic attack (TIA), and cerebral infarction without residual deficits: Secondary | ICD-10-CM

## 2016-08-04 DIAGNOSIS — D631 Anemia in chronic kidney disease: Secondary | ICD-10-CM | POA: Insufficient documentation

## 2016-08-04 DIAGNOSIS — R5381 Other malaise: Secondary | ICD-10-CM | POA: Diagnosis not present

## 2016-08-04 DIAGNOSIS — Z9223 Personal history of estrogen therapy: Secondary | ICD-10-CM | POA: Diagnosis not present

## 2016-08-04 DIAGNOSIS — Z9221 Personal history of antineoplastic chemotherapy: Secondary | ICD-10-CM | POA: Diagnosis not present

## 2016-08-04 DIAGNOSIS — C50912 Malignant neoplasm of unspecified site of left female breast: Secondary | ICD-10-CM

## 2016-08-04 LAB — DIFFERENTIAL
BASOS ABS: 0.1 10*3/uL (ref 0–0.1)
BASOS PCT: 2 %
Eosinophils Absolute: 0.3 10*3/uL (ref 0–0.7)
Eosinophils Relative: 5 %
LYMPHS ABS: 1.1 10*3/uL (ref 1.0–3.6)
Lymphocytes Relative: 19 %
Monocytes Absolute: 0.8 10*3/uL (ref 0.2–0.9)
Monocytes Relative: 14 %
NEUTROS PCT: 60 %
Neutro Abs: 3.6 10*3/uL (ref 1.4–6.5)

## 2016-08-04 LAB — SAMPLE TO BLOOD BANK

## 2016-08-04 LAB — CBC
HCT: 29.2 % — ABNORMAL LOW (ref 35.0–47.0)
HEMOGLOBIN: 9.6 g/dL — AB (ref 12.0–16.0)
MCH: 33.9 pg (ref 26.0–34.0)
MCHC: 32.9 g/dL (ref 32.0–36.0)
MCV: 103 fL — ABNORMAL HIGH (ref 80.0–100.0)
PLATELETS: 221 10*3/uL (ref 150–440)
RBC: 2.84 MIL/uL — ABNORMAL LOW (ref 3.80–5.20)
RDW: 15.4 % — ABNORMAL HIGH (ref 11.5–14.5)
WBC: 5.9 10*3/uL (ref 3.6–11.0)

## 2016-08-04 LAB — RETICULOCYTES
RBC.: 2.84 MIL/uL — AB (ref 3.80–5.20)
RETIC CT PCT: 2.1 % (ref 0.4–3.1)
Retic Count, Absolute: 59.6 10*3/uL (ref 19.0–183.0)

## 2016-08-04 LAB — IRON AND TIBC
IRON: 26 ug/dL — AB (ref 28–170)
SATURATION RATIOS: 13 % (ref 10.4–31.8)
TIBC: 206 ug/dL — AB (ref 250–450)
UIBC: 180 ug/dL

## 2016-08-04 LAB — FOLATE: Folate: 26 ng/mL (ref >5.9)

## 2016-08-04 LAB — FERRITIN: FERRITIN: 1032 ng/mL — AB (ref 11–307)

## 2016-08-04 LAB — TSH: TSH: 16.373 u[IU]/mL — AB (ref 0.350–4.500)

## 2016-08-04 LAB — SEDIMENTATION RATE: Sed Rate: >140 mm/hr — ABNORMAL HIGH (ref 0–30)

## 2016-08-04 LAB — LACTATE DEHYDROGENASE: LDH: 157 U/L (ref 98–192)

## 2016-08-04 LAB — VITAMIN B12: Vitamin B-12: 796 pg/mL (ref 180–914)

## 2016-08-04 NOTE — Progress Notes (Signed)
Here referred by Dr Candiss Norse.

## 2016-08-05 ENCOUNTER — Telehealth: Payer: Self-pay | Admitting: *Deleted

## 2016-08-05 ENCOUNTER — Inpatient Hospital Stay: Payer: Medicare Other

## 2016-08-05 ENCOUNTER — Encounter: Payer: Self-pay | Admitting: Oncology

## 2016-08-05 DIAGNOSIS — Z171 Estrogen receptor negative status [ER-]: Secondary | ICD-10-CM

## 2016-08-05 DIAGNOSIS — C50912 Malignant neoplasm of unspecified site of left female breast: Secondary | ICD-10-CM | POA: Insufficient documentation

## 2016-08-05 DIAGNOSIS — D539 Nutritional anemia, unspecified: Secondary | ICD-10-CM | POA: Insufficient documentation

## 2016-08-05 LAB — HAPTOGLOBIN: HAPTOGLOBIN: 295 mg/dL — AB (ref 34–200)

## 2016-08-05 NOTE — Addendum Note (Signed)
Addended by: Luella Cook on: 08/05/2016 12:56 PM   Modules accepted: Orders

## 2016-08-05 NOTE — Telephone Encounter (Signed)
We had scheduled pt for potential blood transfusion 2/16 after she gets completed with dialysis but her hgb came back 9.9 and I called her daughter [name] and let her know her hgb is higher so she does not need transfusion.  Also called [facility]-nursing facility and left message on voicemail of [place] that she does not need transportation for blood her hgb was 9.9.  She will only need to go to dialysis 2/16.  Pt gets dialysis at [facility]. [place]. Location.

## 2016-08-05 NOTE — Progress Notes (Signed)
Hematology/Oncology Consult note [facility] [phone] [phone]  Patient Care Team: Madelyn Brunner, MD as PCP - General (Internal Medicine) Jackolyn Confer, MD (Internal Medicine)   Name of the patient: Patricia Woodard  [id number]  [birth year]    Reason for referral- normocytic anemia   Referring physician- Dr. Murlean Iba  Date of visit: 08/05/16   History of presenting illness- patient is an 83 year old female with multiple medical problems including dilated cardiomyopathy status post AICD placement, ESRD on hemodialysis. Patient also had a prior history of kidney cancer in 2006 and family states that she had some kind of (cryotherapy" but never underwent any surgery for the same. She was then diagnosed with left breast cancer in March 2016 status post left mastectomy and was started on Arimidex. Given her poor performance status she was not deemed to be a candidate for adjuvant chemotherapy. In November 2017 patient had a fall and fractured her right hip just after surgery for her left hip. She had to undergo right hip surgery and is currently in subacute rehabilitation. It is unclear if she has been receiving Arimidex at the rehabilitation as I do not see that listed in her medication list today. The patient is known to have anemia of chronic kidney disease for which she was receiving EPO shots during her dialysis. She was found to have bilateral lung nodules on CT thorax in November 2017 and was supposed to see pulmonary as an outpatient but canceled it multiple times because of her hip fracture. There was a concern for possible progression of these pulmonary nodules while receiving EPO shots and hence those shots were held. She began to [facility] progressive anemia and her hemoglobin was down to 6.8 at 1. and she received 1 unit of blood transfusion in January 2018. She has been referred to [place] for further evaluation and management of her anemia.  Given proximity to [place] patient does not wish to go back to [facility] for her subsequent hematology/oncology care. Prior to her rehabilitation stay patient was living with her daughters and ambulating with a walker. 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 ER negative, PR (1-10%), HER-2 negative.  01/08/2015 Surgery Left total mastectomy: 2.6 cm, grade 3, IDC. SLNB 0/2 LN positive.   With regards to her anemia on review of her prior CBC she is always had macrocytic anemia dating back to February 2015. On 05/17/2016 CBC showed white count of 4.6, H&H of 8.8/26.2 and a platelet count of 88.   ECOG PS- 3  Pain scale- 3- hip pain   Review of systems- Review of Systems  Constitutional: Positive for malaise/fatigue. Negative for chills, fever and weight loss.  HENT: Negative for congestion, ear discharge and nosebleeds.   Eyes: Negative for blurred vision.  Respiratory: Negative for cough, hemoptysis, sputum production, shortness of breath and wheezing.   Cardiovascular: Negative for chest pain, palpitations, orthopnea and claudication.  Gastrointestinal: Negative for abdominal pain, blood in stool, constipation, diarrhea, heartburn, melena, nausea and vomiting.  Genitourinary: Negative for dysuria, flank pain, frequency, hematuria and urgency.  Musculoskeletal: Positive for joint pain. Negative for back pain and myalgias.  Skin: Negative for rash.  Neurological: Positive for weakness. Negative for dizziness, tingling, focal weakness, seizures and headaches.  Endo/Heme/Allergies: Does not bruise/bleed easily.    Psychiatric/Behavioral: Negative for depression and suicidal ideas. The patient does not have insomnia.     Allergies  Allergen Reactions  . Penicillins Itching  . Ivp Dye [Iodinated Diagnostic Agents] Rash  . Shellfish Allergy Rash    Patient Active Problem List   Diagnosis Date Noted  . S/P ORIF (open reduction internal fixation) fracture 05/17/2016  . Hypotension 05/17/2016  . ESRD on dialysis ([place]) 05/17/2016  . Chronic atrial fibrillation ([place]) 05/17/2016  . Diastolic CHF, chronic ([place]) 05/17/2016  . Hyponatremia 05/17/2016  . Essential hypertension 05/17/2016  . Pulmonary nodules/lesions, multiple 05/17/2016  . Respiratory failure, acute ([place]) 05/14/2016  . Closed right hip fracture ([place]) 05/13/2016     Past Medical History:  Diagnosis Date  . Anemia   . Arrhythmia   . Cancer ([place])    breast ca - left, kidney  . Dialysis AV fistula infection ([place])    Left arm Mon, Wed, Fri dialysis  . Hypertension   . MI, old   . Renal insufficiency    dialysis  . Stroke [facility]    2009     Past Surgical History:  Procedure Laterality Date  . APPENDECTOMY    . AV FISTULA PLACEMENT Left   . CHOLECYSTECTOMY    . HIP FRACTURE SURGERY Left   . ICD LEAD REMOVAL N/A 11/04/2014   Procedure: ICD LEAD REMOVAL;  Surgeon: Isaias Cowman, MD;  Location: [facility] ORS;  Service: Cardiovascular;  Laterality: N/A;  . INSERT / REPLACE / REMOVE PACEMAKER    . kidney tumor Right   . ORIF HIP FRACTURE Right 05/14/2016   Procedure: OPEN REDUCTION INTERNAL FIXATION HIP;  Surgeon: Earnestine Leys, MD;  Location: [facility] ORS;  Service: Orthopedics;  Laterality: Right;    Social History   Social History  . Marital status: Married    Spouse name: N/A  . Number of children: N/A  . Years of education: N/A   Occupational History  . Not on file.   Social History Main Topics  . Smoking status: Former Smoker    Packs/day: 0.50    Years: 60.00    Quit date: 2017  . Smokeless tobacco: Former  Systems developer    Quit date: 04/2016  . Alcohol use No  . Drug use: No  . Sexual activity: Not on file   Other Topics Concern  . Not on file   Social History Narrative  . No narrative on file     History reviewed. No pertinent family history.   Current Outpatient Prescriptions:  .  acetaminophen (TYLENOL) 325 MG tablet, Take 2 tablets (650 mg total) by mouth every 6 (six) hours as needed for mild pain (or Fever >/= 101)., Disp: 60 tablet, Rfl: 6 .  aspirin EC 81 MG tablet, Take 81 mg by mouth daily., Disp: , Rfl:  .  bisacodyl (DULCOLAX) 10 MG suppository, Place 1 suppository (10 mg total) rectally daily as needed for moderate constipation., Disp: 12 suppository, Rfl: 0 .  carvedilol (COREG) 6.25 MG tablet, , Disp: , Rfl:  .  chlorhexidine gluconate, MEDLINE KIT, (PERIDEX) 0.12 % solution, 15 mLs by Mouth Rinse route 2 (two) times daily., Disp: 120 mL, Rfl: 0 .  clopidogrel (PLAVIX) 75 MG tablet, Take 75 mg by mouth daily., Disp: , Rfl:  .  diphenhydrAMINE (BENADRYL) 25 MG tablet, Take 25 mg by mouth every  6 (six) hours as needed., Disp: , Rfl:  .  ferrous sulfate 325 (65 FE) MG tablet, Take 1 tablet (325 mg total) by mouth daily with breakfast., Disp: 30 tablet, Rfl: 3 .  furosemide (LASIX) 80 MG tablet, TAKE ONE TABLET BY MOUTH EVERY DAY, Disp: 90 tablet, Rfl: 3 .  gabapentin (NEURONTIN) 300 MG capsule, 3 (three) times daily. , Disp: , Rfl:  .  heparin 5000 UNIT/ML injection, Inject 1 mL (5,000 Units total) into the skin every 12 (twelve) hours. (Patient taking differently: Inject 5,000 Units into the skin every 12 (twelve) hours. ), Disp: 28 mL, Rfl: 0 .  HYDROcodone-acetaminophen (NORCO/VICODIN) 5-325 MG tablet, Take 1-2 tablets by mouth every 6 (six) hours as needed for moderate pain., Disp: 30 tablet, Rfl: 0 .  isosorbide mononitrate (IMDUR) 30 MG 24 hr tablet, , Disp: , Rfl:  .  levothyroxine (SYNTHROID, LEVOTHROID) 100 MCG tablet, Take 100 mcg by mouth daily before breakfast., Disp: ,  Rfl:  .  losartan (COZAAR) 100 MG tablet, TAKE ONE TABLET BY MOUTH ONCE DAILY, Disp: , Rfl:  .  lovastatin (MEVACOR) 40 MG tablet, TAKE ONE TABLET BY MOUTH AT BEDTIME, Disp: 90 tablet, Rfl: 3 .  mirtazapine (REMERON) 15 MG tablet, Take 15 mg by mouth at bedtime., Disp: , Rfl:  .  multivitamin (RENA-VIT) TABS tablet, Take 1 tablet by mouth daily. 0.16m, Disp: , Rfl:  .  PACERONE 200 MG tablet, TAKE ONE TABLET BY MOUTH EVERY DAY, Disp: 90 each, Rfl: 0 .  senna (SENOKOT) 8.6 MG TABS tablet, Take 1 tablet (8.6 mg total) by mouth 2 (two) times daily., Disp: 120 each, Rfl: 0 .  SENSIPAR 30 MG tablet, TAKE ONE TABLET BY MOUTH AT DINNER. (Patient taking differently: TAKE ONE TABLET BY MOUTH ON DIALYSIS DAYS.), Disp: 30 each, Rfl: 1 .  sevelamer carbonate (RENVELA) 800 MG tablet, Take 800 mg by mouth 3 (three) times daily with meals., Disp: , Rfl:  .  sevelamer carbonate (RENVELA) 800 MG tablet, Take 1,600 mg by mouth., Disp: , Rfl:  .  Amino Acid Infusion (PROSOL) 20 % SOLN, , Disp: , Rfl:  .  anastrozole (ARIMIDEX) 1 MG tablet, Take 1 mg by mouth daily., Disp: , Rfl:  .  esomeprazole (NEXIUM) 20 MG capsule, Take 20 mg by mouth daily at 12 noon., Disp: , Rfl:    Physical exam:  Vitals:   08/04/16 1534 08/04/16 1555  BP: (!) 141/66 (!) 141/66  Pulse: 61 61  Resp: 18 18  Temp: (!) 95.2 F (35.1 C) (!) 95.2 F (35.1 C)  TempSrc: Tympanic Tympanic  Weight:  126 lb 1.7 oz (57.2 kg)  Height:  5' 5"  (1.651 m)   Physical Exam  Constitutional: She is oriented to person, place, and time.  Elderly frail woman sitting in a wheelchair area and appears in no acute distress  HENT:  Head: Normocephalic and atraumatic.  Eyes: EOM are normal. Pupils are equal, round, and reactive to light.  Neck: Normal range of motion.  Cardiovascular: Normal rate and regular rhythm.   Murmur (Systolic murmur positive) heard. Pulmonary/Chest: Effort normal and breath sounds normal.  Status post left mastectomy    Abdominal: Soft. Bowel sounds are normal.  Neurological: She is alert and oriented to person, place, and time.  Skin: Skin is warm and dry.       CMP Latest Ref Rng & Units 05/17/2016  Glucose 65 - 99 mg/dL -  BUN 6 - 20 mg/dL -  Creatinine  0.44 - 1.00 mg/dL -  Sodium 135 - 145 mmol/L 136  Potassium 3.5 - 5.1 mmol/L -  Chloride 101 - 111 mmol/L -  CO2 22 - 32 mmol/L -  Calcium 8.9 - 10.3 mg/dL -  Total Protein 6.5 - 8.1 g/dL -  Total Bilirubin 0.3 - 1.2 mg/dL -  Alkaline Phos 38 - 126 U/L -  AST 15 - 41 U/L -  ALT 14 - 54 U/L -   CBC Latest Ref Rng & Units 08/04/2016  WBC 3.6 - 11.0 K/uL 5.9  Hemoglobin 12.0 - 16.0 g/dL 9.6(L)  Hematocrit 35.0 - 47.0 % 29.2(L)  Platelets 150 - 440 K/uL 221   CT thorax in Nov 2017:   1. Multiple bilateral pulmonary nodules as above, largest of which measures 15 x 16 mm within the left upper lobe. While these are indeterminate, given the spiculated nature of a few of these nodules, findings are concerning for possible underlying malignancy. Multifocal primary bronchogenic carcinoma or possibly nodal metastases could be considered. Further evaluation with dedicated PET-CT recommended with possible followup histologic sampling as indicated. 2. Small bilateral pleural effusions with associated bibasilar opacities, likely atelectasis, although superimposed infection not excluded, particularly at the left lung base. 3. Cardiomegaly with mild diffuse pulmonary interstitial edema. 4. Emphysema. 5. Acute to subacute nondisplaced fracture of the T3 spinous process. 6. Chronic compression deformity of the T12 vertebral body. This may be pathologic in nature.    Assessment and plan- Patient is a 83 y.o. female who has been referred to [place] for evaluation of macrocytic anemia  1. Macrocytic anemia- given that she has had chronic macrocytosis dating back to 2015 I suspect that her anemia is multifactorial secondary to anemia of chronic kidney  disease as well as a possible component of MDS given her age. Today I will do a complete anemia workup including a CBC with, B12, folate, ferritin and iron studies, TSH, LDH, ESR, haptoglobin and reticulocyte count and myeloma panel. If her hemoglobin is less than or equal to 7 we will make arrangements for blood transfusion. I will see her back in 2 weeks' time to discuss the results of her blood work  2. Bilateral lung nodules- patient has an appointment with pulmonary in 2 weeks from now. We will discuss her case at the multidisciplinary tumor conference next Thursday and decide if a PET scan would be warranted at this time. Patient is a long-time smoker and bilateral lung nodules could either be nonspecific versus primary lung cancer versus metastatic breast cancer or kidney cancer given patient's past history. I had a lengthy conversation with the patient's family. We want to find out the etiology of these lung nodules given that patient has multiple medical problems and a poor performance status. Patient's family would like to proceed with further investigations at this time. After we come to a consensus at the tumor board I will discuss further plan of action when I meet the patient back in 2 weeks' time  3. Stage II left breast cancer- patient was started on anastrozole but I'm not sure if she has been getting this at the nursing home. I'm also concerned that CT thorax did show a possible pathologic fracture in the T12 vertebral body and I do not see a baseline bone density scan for this patient. We will obtain a bone density scan at this time before restarting on anastrozole and if she has evidence of osteoporosis, I would strongly consider tamoxifen over anastrozole in her case and  I will discuss this further when I see her in 2 weeks' time   Total face to face encounter time for this patient visit was 45 min. >50% of the time was  spent in counseling and coordination of care. Time spent in reviewing  outside records on breast cancer     Thank you for this kind referral and the opportunity to participate in the care of this patient   Visit Diagnosis 1. Macrocytic anemia   2. Malignant neoplasm of left breast in female, estrogen receptor positive, unspecified site of breast ([place])   3. Multiple lung nodules on CT     Dr. Randa Evens, MD, MPH [facility] Pager- [phone] 08/05/2016

## 2016-08-07 LAB — MULTIPLE MYELOMA PANEL, SERUM
Albumin SerPl Elph-Mcnc: 3 g/dL (ref 2.9–4.4)
Albumin/Glob SerPl: 0.8 (ref 0.7–1.7)
Alpha 1: 0.5 g/dL — ABNORMAL HIGH (ref 0.0–0.4)
Alpha2 Glob SerPl Elph-Mcnc: 0.9 g/dL (ref 0.4–1.0)
B-GLOBULIN SERPL ELPH-MCNC: 1.1 g/dL (ref 0.7–1.3)
Gamma Glob SerPl Elph-Mcnc: 1.6 g/dL (ref 0.4–1.8)
Globulin, Total: 4 g/dL — ABNORMAL HIGH (ref 2.2–3.9)
IGM, SERUM: 147 mg/dL (ref 26–217)
IgA: 752 mg/dL — ABNORMAL HIGH (ref 64–422)
IgG (Immunoglobin G), Serum: 1261 mg/dL (ref 700–1600)
TOTAL PROTEIN ELP: 7 g/dL (ref 6.0–8.5)

## 2016-08-11 ENCOUNTER — Inpatient Hospital Stay: Payer: Medicare Other

## 2016-08-12 ENCOUNTER — Other Ambulatory Visit: Payer: Self-pay | Admitting: *Deleted

## 2016-08-12 DIAGNOSIS — C50912 Malignant neoplasm of unspecified site of left female breast: Secondary | ICD-10-CM

## 2016-08-12 DIAGNOSIS — Z170 Estrogen receptor positive status [ER+]: Principal | ICD-10-CM

## 2016-08-16 ENCOUNTER — Ambulatory Visit (INDEPENDENT_AMBULATORY_CARE_PROVIDER_SITE_OTHER): Payer: Medicare Other | Admitting: Pulmonary Disease

## 2016-08-16 ENCOUNTER — Encounter: Payer: Self-pay | Admitting: Pulmonary Disease

## 2016-08-16 VITALS — BP 128/76 | HR 96

## 2016-08-16 DIAGNOSIS — R54 Age-related physical debility: Secondary | ICD-10-CM | POA: Diagnosis not present

## 2016-08-16 DIAGNOSIS — R918 Other nonspecific abnormal finding of lung field: Secondary | ICD-10-CM | POA: Diagnosis not present

## 2016-08-16 DIAGNOSIS — J479 Bronchiectasis, uncomplicated: Secondary | ICD-10-CM

## 2016-08-16 DIAGNOSIS — Z992 Dependence on renal dialysis: Secondary | ICD-10-CM

## 2016-08-16 DIAGNOSIS — Z87891 Personal history of nicotine dependence: Secondary | ICD-10-CM | POA: Diagnosis not present

## 2016-08-16 DIAGNOSIS — N186 End stage renal disease: Secondary | ICD-10-CM | POA: Diagnosis not present

## 2016-08-16 DIAGNOSIS — R011 Cardiac murmur, unspecified: Secondary | ICD-10-CM

## 2016-08-16 NOTE — Progress Notes (Signed)
PULMONARY OFFICE FOLLOW UP NOTE - post hospital  PROBLEMS:  1) Hospitalized 05/13/16 with R hip fracture. Seen postoperatively by PCCM for hypotension and post operative respiratory failure requiring re-intubation 2) ESRD on HD 3) Chronic AF 4) History of breast cancer and renal tumor 5) Former smoker 6) MPNs as noted on CT chest (see report below) - new since 2014  DATA: CT chest 05/16/16: Multiple bilateral pulmonary nodules, largest of which measures 15 x 16 mm within the left upper lobe. While these are indeterminate, given the spiculated nature of a few of these nodules, findings are concerning for possible underlying malignancy  INTERVAL HISTORY: Seen by Dr Janese Banks for anemia 08/04/16.   SUBJ: This is my first time meeting Patricia Woodard. She is very frail and reports generalized weakness and fatigue. She also reports chest discomfort and generalized aches and pains. Denies fever, purulent sputum, hemoptysis, LE edema and calf tenderness   OBJ: Vitals:   08/16/16 1028  BP: 128/76  Pulse: 96  SpO2: 94%   Frail, NAD HEENT WNL  No LAN R>L basilar crackles, no wheezes Reg, III/VI systolic murmur No clubbing, edema   DATA: CBC Latest Ref Rng & Units 08/04/2016 05/17/2016 05/16/2016  WBC 3.6 - 11.0 K/uL 5.9 4.6 5.6  Hemoglobin 12.0 - 16.0 g/dL 9.6(L) 8.8(L) 10.1(L)  Hematocrit 35.0 - 47.0 % 29.2(L) 26.2(L) 29.2(L)  Platelets 150 - 440 K/uL 221 88(L) 76(L)   BMP Latest Ref Rng & Units 05/17/2016 05/16/2016 05/16/2016  Glucose 65 - 99 mg/dL - 74 74  BUN 6 - 20 mg/dL - 46(H) 47(H)  Creatinine 0.44 - 1.00 mg/dL - 7.51(H) 7.47(H)  Sodium 135 - 145 mmol/L 136 119(LL) 120(L)  Potassium 3.5 - 5.1 mmol/L - 5.1 4.9  Chloride 101 - 111 mmol/L - 86(L) 87(L)  CO2 22 - 32 mmol/L - 20(L) 20(L)  Calcium 8.9 - 10.3 mg/dL - 7.9(L) 7.8(L)   No new CXR  IMPRESSION: Former smoker Multiple pulmonary nodules - these are new since 2014 History of renal cell ca History of breast cancer ESRD on  HD Dilated cardiomyopathy Advanced age with multiple medical problems   PLAN: CT chest from 04/2016 was reviewed with pt and her family. I showed them the multiple pulmonary nodules and other findings. I expressed my concern that these are likely malignant. I further explained that, if malignant, there will probably be little to offer her in terms of specific therapies beyond a palliative focus.  I spoke with Dr Janese Banks over the phone and we agreed to proceed with PET scan. I have ordered this. She has follow up arranged with Dr Janese Banks in the near future. I have not scheduled follow up with me but will be available to perform bronchoscopy if it is deemed necessary. It should be noted that she is at high risk of procedural complications if we were to undertake such and therefore, I am inclined to try to avoid this  An extra 20 mins was used in sharing the above information and offering the above consultation with the patient and family   Merton Border, MD PCCM service Mobile [phone] Pager [phone] 08/18/2016

## 2016-08-16 NOTE — Patient Instructions (Signed)
We will obtain a PET-CT scan of the chest, then discuss these things at tumor conference on Thurs 03/01. Dr Janese Banks and I will decide the best way to proceed with the help of the radiologists. I have not scheduled follow up with you but someone will contact you with the next step in the plan

## 2016-08-18 ENCOUNTER — Ambulatory Visit
Admission: RE | Admit: 2016-08-18 | Discharge: 2016-08-18 | Disposition: A | Discharge status: Home / Self Care | Payer: Medicare Other | Source: Ambulatory Visit | Attending: Pulmonary Disease | Admitting: Pulmonary Disease

## 2016-08-18 ENCOUNTER — Encounter: Payer: Self-pay | Admitting: Oncology

## 2016-08-18 ENCOUNTER — Inpatient Hospital Stay: Payer: Medicare Other | Attending: Oncology | Admitting: Oncology

## 2016-08-18 VITALS — BP 147/82 | HR 60 | Resp 18

## 2016-08-18 DIAGNOSIS — I70208 Unspecified atherosclerosis of native arteries of extremities, other extremity: Secondary | ICD-10-CM | POA: Diagnosis not present

## 2016-08-18 DIAGNOSIS — Z9012 Acquired absence of left breast and nipple: Secondary | ICD-10-CM | POA: Diagnosis not present

## 2016-08-18 DIAGNOSIS — R5381 Other malaise: Secondary | ICD-10-CM

## 2016-08-18 DIAGNOSIS — Z95810 Presence of automatic (implantable) cardiac defibrillator: Secondary | ICD-10-CM | POA: Insufficient documentation

## 2016-08-18 DIAGNOSIS — R609 Edema, unspecified: Secondary | ICD-10-CM | POA: Diagnosis not present

## 2016-08-18 DIAGNOSIS — J439 Emphysema, unspecified: Secondary | ICD-10-CM | POA: Diagnosis not present

## 2016-08-18 DIAGNOSIS — Z880 Allergy status to penicillin: Secondary | ICD-10-CM

## 2016-08-18 DIAGNOSIS — D631 Anemia in chronic kidney disease: Secondary | ICD-10-CM | POA: Insufficient documentation

## 2016-08-18 DIAGNOSIS — D539 Nutritional anemia, unspecified: Secondary | ICD-10-CM

## 2016-08-18 DIAGNOSIS — I132 Hypertensive heart and chronic kidney disease with heart failure and with stage 5 chronic kidney disease, or end stage renal disease: Secondary | ICD-10-CM

## 2016-08-18 DIAGNOSIS — Z8673 Personal history of transient ischemic attack (TIA), and cerebral infarction without residual deficits: Secondary | ICD-10-CM | POA: Insufficient documentation

## 2016-08-18 DIAGNOSIS — Z992 Dependence on renal dialysis: Secondary | ICD-10-CM | POA: Insufficient documentation

## 2016-08-18 DIAGNOSIS — C50912 Malignant neoplasm of unspecified site of left female breast: Secondary | ICD-10-CM

## 2016-08-18 DIAGNOSIS — R918 Other nonspecific abnormal finding of lung field: Secondary | ICD-10-CM | POA: Diagnosis present

## 2016-08-18 DIAGNOSIS — I2510 Atherosclerotic heart disease of native coronary artery without angina pectoris: Secondary | ICD-10-CM | POA: Insufficient documentation

## 2016-08-18 DIAGNOSIS — N186 End stage renal disease: Secondary | ICD-10-CM | POA: Diagnosis not present

## 2016-08-18 DIAGNOSIS — Z79899 Other long term (current) drug therapy: Secondary | ICD-10-CM | POA: Diagnosis not present

## 2016-08-18 DIAGNOSIS — R5383 Other fatigue: Secondary | ICD-10-CM | POA: Insufficient documentation

## 2016-08-18 DIAGNOSIS — C787 Secondary malignant neoplasm of liver and intrahepatic bile duct: Secondary | ICD-10-CM

## 2016-08-18 DIAGNOSIS — Z8781 Personal history of (healed) traumatic fracture: Secondary | ICD-10-CM | POA: Insufficient documentation

## 2016-08-18 DIAGNOSIS — C7951 Secondary malignant neoplasm of bone: Secondary | ICD-10-CM | POA: Insufficient documentation

## 2016-08-18 DIAGNOSIS — I420 Dilated cardiomyopathy: Secondary | ICD-10-CM | POA: Diagnosis not present

## 2016-08-18 DIAGNOSIS — I700 Atherosclerosis of aorta: Secondary | ICD-10-CM | POA: Diagnosis not present

## 2016-08-18 DIAGNOSIS — C50919 Malignant neoplasm of unspecified site of unspecified female breast: Secondary | ICD-10-CM

## 2016-08-18 DIAGNOSIS — Z87891 Personal history of nicotine dependence: Secondary | ICD-10-CM | POA: Insufficient documentation

## 2016-08-18 DIAGNOSIS — I517 Cardiomegaly: Secondary | ICD-10-CM | POA: Insufficient documentation

## 2016-08-18 DIAGNOSIS — Z7189 Other specified counseling: Secondary | ICD-10-CM

## 2016-08-18 DIAGNOSIS — E039 Hypothyroidism, unspecified: Secondary | ICD-10-CM

## 2016-08-18 DIAGNOSIS — Z853 Personal history of malignant neoplasm of breast: Secondary | ICD-10-CM | POA: Insufficient documentation

## 2016-08-18 DIAGNOSIS — Z79811 Long term (current) use of aromatase inhibitors: Secondary | ICD-10-CM | POA: Diagnosis not present

## 2016-08-18 DIAGNOSIS — K769 Liver disease, unspecified: Secondary | ICD-10-CM | POA: Diagnosis not present

## 2016-08-18 DIAGNOSIS — Z7982 Long term (current) use of aspirin: Secondary | ICD-10-CM | POA: Insufficient documentation

## 2016-08-18 DIAGNOSIS — Z85528 Personal history of other malignant neoplasm of kidney: Secondary | ICD-10-CM | POA: Insufficient documentation

## 2016-08-18 DIAGNOSIS — Z171 Estrogen receptor negative status [ER-]: Secondary | ICD-10-CM | POA: Diagnosis not present

## 2016-08-18 LAB — GLUCOSE, CAPILLARY: GLUCOSE-CAPILLARY: 76 mg/dL (ref 65–99)

## 2016-08-18 MED ORDER — FLUDEOXYGLUCOSE F - 18 (FDG) INJECTION
12.0000 | Freq: Once | INTRAVENOUS | Status: AC | PRN
  Administered 2016-08-18: 11.81 via INTRAVENOUS

## 2016-08-18 NOTE — Progress Notes (Signed)
Just got out of nursing home. Pt here to get pet results.

## 2016-08-19 ENCOUNTER — Telehealth: Payer: Self-pay | Admitting: *Deleted

## 2016-08-19 DIAGNOSIS — Z7189 Other specified counseling: Secondary | ICD-10-CM | POA: Insufficient documentation

## 2016-08-19 NOTE — Progress Notes (Signed)
Hematology/Oncology Consult note [facility]  Telephone:([phone] [phone]  Patient Care Team: Madelyn Brunner, MD as PCP - General (Internal Medicine) Jackolyn Confer, MD (Internal Medicine)   Name of the patient: Patricia Woodard  [id number]  [birth year]   Date of visit: 08/19/16  Diagnosis- metastatic breast cancer Anemia of chronic kidney disease  Chief complaint/ Reason for visit- discuss pet ct and further management  Heme/Onc history: patient is an 83 year old female with multiple medical problems including dilated cardiomyopathy status post AICD placement, ESRD on hemodialysis. Patient also had a prior history of kidney cancer in 2006 and family states that she had some kind of (cryotherapy" but never underwent any surgery for the same. She was then diagnosed with left breast cancer in March 2016 status post left mastectomy and was started on Arimidex. Given her poor performance status she was not deemed to be a candidate for adjuvant chemotherapy. In November 2017 patient had a fall and fractured her right hip just after surgery for her left hip. She had to undergo right hip surgery and is currently in subacute rehabilitation. It is unclear if she has been receiving Arimidex at the rehabilitation as I do not see that listed in her medication list today. The patient is known to have anemia of chronic kidney disease for which she was receiving EPO shots during her dialysis. She was found to have bilateral lung nodules on CT thorax in November 2017 and was supposed to see pulmonary as an outpatient but canceled it multiple times because of her hip fracture. There was a concern for possible progression of these pulmonary nodules while receiving EPO shots and hence those shots were held. She began to [facility] progressive anemia and her hemoglobin was down to 6.8 at 1. and she received 1 unit of blood transfusion in January 2018. She has been referred to [place]  for further evaluation and management of her anemia. 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 ER negative, PR (1-10%), HER-2 negative.  01/08/2015 Surgery Left total mastectomy: 2.6 cm, grade 3, IDC. SLNB 0/2 LN positive  Patient was seen by Dr. Alva Garnet from pulmonary and underwent a PET CT scan today. It showed:1. Scattered multiple bilateral hypermetabolic pulmonary nodules with extensive osseous metastatic disease, and several early metastatic lesions in the liver. Extensive bony destructive findings in the right acetabulum and adjacent iliac bone. Possible destructive findings in the C2 vertebral body, with numerous additional bony lesions. No overt adenopathy in the chest, I favor the multifocal malignancy visible on today's exam and likely being metastatic from the patient's prior breast cancer. 2. Prominent rectal stool with perirectal stranding and rectal wall thickening, cannot exclude stercoral colitis. 3. Other imaging findings of potential clinical significance: Emphysema. Moderate to prominent cardiomegaly with interstitial edema. Coronary, aortic arch, and branch vessel atherosclerotic vascular disease. Aortoiliac atherosclerotic vascular disease.   Interval history- patient is now out of the rehabilitation and is living with her family. Her mobility is quite limited and she has been continuing her dialysis 3 times a week. She is planning to start home physical therapy soon  ECOG PS- 3  Pain scale- 3  Review of systems- Review of Systems  Constitutional: Positive for malaise/fatigue. Negative  for chills, fever and weight loss.  HENT: Negative for congestion, ear discharge and nosebleeds.   Eyes: Negative for blurred vision.  Respiratory: Negative for cough, hemoptysis, sputum production, shortness of breath and wheezing.   Cardiovascular: Negative for chest pain, palpitations, orthopnea and claudication.  Gastrointestinal: Negative for abdominal pain, blood in stool, constipation, diarrhea, heartburn, melena, nausea and vomiting.  Genitourinary: Negative for dysuria, flank pain, frequency, hematuria and urgency.  Musculoskeletal: Positive for back pain and joint pain. Negative for myalgias.  Skin: Negative for rash.  Neurological: Positive for weakness. Negative for dizziness, tingling, focal weakness, seizures and headaches.  Endo/Heme/Allergies: Does not bruise/bleed easily.  Psychiatric/Behavioral: Negative for depression and suicidal ideas. The patient does not have insomnia.        Allergies  Allergen Reactions  . Penicillins Itching  . Ivp Dye [Iodinated Diagnostic Agents] Rash  . Shellfish Allergy Rash     Past Medical History:  Diagnosis Date  . Anemia   . Arrhythmia   . Cancer ([place])    breast ca - left, kidney  . Dialysis AV fistula infection ([place])    Left arm Mon, Wed, Fri dialysis  . Hypertension   . MI, old   . Renal insufficiency    dialysis  . Stroke [facility])    2009     Past Surgical History:  Procedure Laterality Date  . APPENDECTOMY    . AV FISTULA PLACEMENT Left   . CHOLECYSTECTOMY    . HIP FRACTURE SURGERY Left   . ICD LEAD REMOVAL N/A 11/04/2014   Procedure: ICD LEAD REMOVAL;  Surgeon: Isaias Cowman, MD;  Location: [facility] ORS;  Service: Cardiovascular;  Laterality: N/A;  . INSERT / REPLACE / REMOVE PACEMAKER    . kidney tumor Right   . ORIF HIP FRACTURE Right 05/14/2016   Procedure: OPEN REDUCTION INTERNAL FIXATION HIP;  Surgeon: Earnestine Leys, MD;  Location: [facility] ORS;  Service: Orthopedics;  Laterality: Right;    Social History     Social History  . Marital status: Widowed    Spouse name: N/A  . Number of children: N/A  . Years of education: N/A   Occupational History  . Not on file.   Social History Main Topics  . Smoking status: Former Smoker    Packs/day: 0.50    Years: 60.00    Quit date: 2017  . Smokeless tobacco: Former Systems developer    Quit date: 04/2016  . Alcohol use No  . Drug use: No  . Sexual activity: Not on file   Other Topics Concern  . Not on file   Social History Narrative  . No narrative on file    History reviewed. No pertinent family history.   Current Outpatient Prescriptions:  .  acetaminophen (TYLENOL) 325 MG tablet, Take 2 tablets (650 mg total) by mouth every 6 (six) hours as needed for mild pain (or Fever >/= 101)., Disp: 60 tablet, Rfl: 6 .  Amino Acid Infusion (PROSOL) 20 % SOLN, , Disp: , Rfl:  .  amiodarone (PACERONE) 200 MG tablet, Take 200 mg by mouth daily., Disp: , Rfl:  .  amLODipine (NORVASC) 10 MG tablet, Take 10 mg by mouth daily., Disp: , Rfl:  .  anastrozole (ARIMIDEX) 1 MG tablet, Take 1 mg by mouth daily., Disp: , Rfl:  .  aspirin EC 81 MG tablet, Take 81 mg by mouth daily., Disp: , Rfl:  .  Azilsartan Medoxomil (EDARBI) 80 MG TABS, Take 80 mg by mouth daily., Disp: , Rfl:  .  bisacodyl (DULCOLAX) 10 MG suppository, Place 1 suppository (10 mg total) rectally daily as needed for moderate constipation., Disp: 12 suppository, Rfl: 0 .  carvedilol (COREG) 6.25 MG tablet, , Disp: , Rfl:  .  cloNIDine (CATAPRES) 0.1 MG tablet, Take 0.1 mg by mouth 3 (three) times daily. As directed, Disp: , Rfl:  .  clopidogrel (PLAVIX) 75 MG tablet, Take 75 mg by mouth daily., Disp: , Rfl:  .  diphenhydrAMINE (BENADRYL) 25 MG tablet, Take 25 mg by mouth every 6 (six) hours as needed., Disp: , Rfl:  .  esomeprazole (NEXIUM) 20 MG capsule, Take 20 mg by mouth daily at 12 noon., Disp: , Rfl:  .  furosemide (LASIX) 80 MG tablet, TAKE ONE TABLET BY MOUTH EVERY DAY, Disp: 90 tablet, Rfl:  3 .  gabapentin (NEURONTIN) 300 MG capsule, 3 (three) times daily. , Disp: , Rfl:  .  hydrALAZINE (APRESOLINE) 100 MG tablet, Take 100 mg by mouth 2 (two) times daily., Disp: , Rfl:  .  isosorbide mononitrate (IMDUR) 30 MG 24 hr tablet, Take 30 mg by mouth daily. , Disp: , Rfl:  .  levothyroxine (SYNTHROID, LEVOTHROID) 100 MCG tablet, Take 100 mcg by mouth daily before breakfast., Disp: , Rfl:  .  lovastatin (MEVACOR) 40 MG tablet, TAKE ONE TABLET BY MOUTH AT BEDTIME, Disp: 90 tablet, Rfl: 3 .  mirtazapine (REMERON) 15 MG tablet, Take 15 mg by mouth at bedtime., Disp: , Rfl:  .  multivitamin (RENA-VIT) TABS tablet, Take 1 tablet by mouth daily. 0.77m, Disp: , Rfl:  .  senna (SENOKOT) 8.6 MG TABS tablet, Take 1 tablet (8.6 mg total) by mouth 2 (two) times daily., Disp: 120 each, Rfl: 0 .  sevelamer carbonate (RENVELA) 800 MG tablet, Take 800 mg by mouth 3 (three) times daily with meals., Disp: , Rfl:  .  ferrous sulfate 325 (65 FE) MG tablet, Take 1 tablet (325 mg total) by mouth daily with breakfast., Disp: 30 tablet, Rfl: 3 .  losartan (COZAAR) 100 MG tablet, TAKE ONE TABLET BY MOUTH ONCE DAILY, Disp: , Rfl:  .  SENSIPAR 30 MG tablet, TAKE ONE TABLET BY MOUTH AT DINNER. (Patient taking differently: TAKE ONE TABLET BY MOUTH ON DIALYSIS DAYS.), Disp: 30 each, Rfl: 1  Physical exam:  Vitals:   08/18/16 1511  BP: (!) 147/82  Pulse: 60  Resp: 18   Physical Exam  Constitutional:  Elderly frail woman sitting on a wheelchair  HENT:  Head: Normocephalic and atraumatic.  Eyes: EOM are normal. Pupils are equal, round, and reactive to light.  Cardiovascular: Normal rate.   Systolic murmur positive  Pulmonary/Chest: Effort normal and breath sounds normal.  Abdominal: Soft. Bowel sounds are normal.  Musculoskeletal: She exhibits edema.  Neurological: She is alert.     CMP Latest Ref Rng & Units 05/17/2016  Glucose 65 - 99 mg/dL -  BUN 6 - 20 mg/dL -  Creatinine 0.44 - 1.00 mg/dL -  Sodium  135 - 145 mmol/L 136  Potassium 3.5 - 5.1 mmol/L -  Chloride 101 - 111 mmol/L -  CO2 22 - 32 mmol/L -  Calcium 8.9 - 10.3 mg/dL -  Total Protein 6.5 - 8.1 g/dL -  Total Bilirubin 0.3 - 1.2 mg/dL -  Alkaline Phos 38 - 126 U/L -  AST 15 - 41 U/L -  ALT 14 - 54 U/L -  CBC Latest Ref Rng & Units 08/04/2016  WBC 3.6 - 11.0 K/uL 5.9  Hemoglobin 12.0 - 16.0 g/dL 9.6(L)  Hematocrit 35.0 - 47.0 % 29.2(L)  Platelets 150 - 440 K/uL 221    No images are attached to the encounter.  Nm Pet Image Restag (ps) Skull Base To Thigh  Result Date: 08/18/2016 CLINICAL DATA:  Initial Treatment strategy for multiple pulmonary nodules. History of breast cancer. EXAM: NUCLEAR MEDICINE PET SKULL BASE TO THIGH TECHNIQUE: 1118 mCi F-18 FDG was injected intravenously. Full-ring PET imaging was performed from the skull base to thigh after the radiotracer. CT data was obtained and used for attenuation correction and anatomic localization. FASTING BLOOD GLUCOSE:  Value: 76 mg/dl COMPARISON:  Chest CT dated 05/16/2016 ; prior nuclear medicine PET-CT of 10/23/2014. FINDINGS: NECK No hypermetabolic lymph nodes in the neck. CHEST Emphysema with scattered bilateral pulmonary nodules which are hypermetabolic. Index 2.1 by 1.9 cm right lower lobe pulmonary nodule on image 102/4 has a maximum SUV of 9.3 Moderate to prominent cardiomegaly with interstitial edema noted. This edema is especially prominent in the lung bases. There is some dropout of PET signal in the upper thorax involving a region primarily at about the level of the aortic arch, probably indicating some artifact. Coronary, aortic arch, and branch vessel atherosclerotic vascular disease. Pacer leads noted. ABDOMEN/PELVIS Several hypodense liver lesions are mildly hypermetabolic, compatible with malignancy. For example a 1.6 by 1.3 cm lesion in the lateral segment left hepatic lobe has a maximum SUV of 5.3, background hepatic activity is about 3.0. A right hepatic lobe  hypermetabolic lesion has a maximum SUV of 4.1. There appears to be a vascular clip within or along the a 4.8 by 3.4 cm lesion of the right medial kidney on image 134 series 4. This lesion has metabolic activity with maximum SUV approximately 2.8, renal parenchymal tissue otherwise has maximum SUV of about 2.0. Aortoiliac atherosclerotic vascular disease. Scattered calcifications along the renal hila are mostly vascular. Prominence of stool in the rectum with perirectal stranding and rectal wall thickening, mild stercoral colitis is not excluded. SKELETON Extensive osseous metastatic disease throughout the skeleton, including a primarily lytic lesion of the C2 vertebral body with maximum SUV 8.4 ; metastatic lesions of the right scapula, right temporal skull base sternum, right medial clavicle, ribs, thoracic spine, lumbar spine, sacrum, iliac bones, and bilateral ischium. Bilateral hip screws noted. Extensive bony destruction of the right acetabulum including the quadrilateral plate, anterior wall, posterior wall, acetabular roof, and a significant buttressing portion of the right iliac bone with extraosseous extension of tumor, maximum SUV in the involved portion of the right iliac bone 13.2. IMPRESSION: 1. Scattered multiple bilateral hypermetabolic pulmonary nodules with extensive osseous metastatic disease, and several early metastatic lesions in the liver. Extensive bony destructive findings in the right acetabulum and adjacent iliac bone. Possible destructive findings in the C2 vertebral body, with numerous additional bony lesions. No overt adenopathy in the chest, I favor the multifocal malignancy visible on today's exam and likely being metastatic from the patient's prior breast cancer. 2. Prominent rectal stool with perirectal stranding and rectal wall thickening, cannot exclude stercoral colitis. 3. Other imaging findings of potential clinical significance: Emphysema. Moderate to prominent cardiomegaly  with interstitial edema. Coronary, aortic arch, and branch vessel atherosclerotic vascular disease. Aortoiliac atherosclerotic vascular disease. Electronically Signed   By: Van Clines M.D.   On: 08/18/2016 11:12     Assessment and plan- Patient is a 83 y.o. female with a  prior history of stage II ER negative PR weakly positive (behaving like triple negative) breast cancer in 2016 status post left mastectomy. At that time she was not deemed to be a candidate for chemotherapy and was started on hormonal therapy. She was referred to me for anemia as also been found to have bilateral pulmonary nodules recently  1 I discussed the results of the PET CT scan which show multiple bone metastases as well as liver metastases which is likely from her breast cancer given that it was almost triple negative at diagnosis. I explained to the patient and family that this is stage IV breast cancer and any kind of treatment but only be palliative and not curative. Given her multiple comorbidities including end-stage renal disease as well as advanced heart disease and chronic lung disease she is not a candidate for any aggressive chemotherapy. I would strongly recommend best supportive care with hospice at this time. Patient's family is open to considering hospice and I did discuss the benefits of hospice and that patient should be able to continue with her dialysis even if she opts for hospice. Currently patient is a full code and family would like to think about this and consider making her a DO NOT RESUSCITATE. She does have an AICD in place which needs to be discussed with cardiology about deactivating the ICD should she decide to become DO NOT RESUSCITATE. We will make a hospice referral for her at this time  2. With regards to her anemia she definitely has evidence of macrocytic anemia and her TSH is quite elevated. We will touch base with her PCP and ask him to evaluate this further to see if levothyroxine therapy  would be indicated not only to improve her anemia but also her energy levels   Total face to face encounter time for this patient visit was 30 min. >50% of the time was  spent in counseling and coordination of care.     Visit Diagnosis 1. Malignant neoplasm of left breast in female, estrogen receptor negative, unspecified site of breast ([place])   2. Goals of care, counseling/discussion   3. Macrocytic anemia   4. Hypothyroidism, unspecified type   5. Metastatic breast cancer ([place])   6. Bone metastases ([place])   7. Liver metastases ([place])      Dr. Randa Evens, MD, MPH [facility] Pager- [phone] 08/19/2016 7:58 AM

## 2016-08-21 NOTE — Telephone Encounter (Signed)
I had spoke to Hospice after making ref 3/2 and was told that while pt is on dialysis she can't have hospice. Her PCP had already put in ref. for palliative care and they will call Cassandra the daughter to get that set up. I called Daughter [name] to let her know tht thye can have palliative care but not hosice dut to pt still on dialysis. She was agreeable.  Also told her as well as calling PCP that TSH is elevated and pt may need change in thyroid med and I sent message as well as faxing tsh results to the PCP office.

## 2016-08-25 ENCOUNTER — Other Ambulatory Visit: Payer: Self-pay

## 2016-08-25 ENCOUNTER — Encounter: Payer: Self-pay | Admitting: Emergency Medicine

## 2016-08-25 ENCOUNTER — Observation Stay
Admission: EM | Admit: 2016-08-25 | Discharge: 2016-09-01 | Disposition: A | Discharge status: Home / Self Care | Payer: Medicare Other | Attending: Internal Medicine | Admitting: Internal Medicine

## 2016-08-25 ENCOUNTER — Observation Stay: Payer: Medicare Other

## 2016-08-25 ENCOUNTER — Emergency Department: Payer: Medicare Other

## 2016-08-25 DIAGNOSIS — J849 Interstitial pulmonary disease, unspecified: Secondary | ICD-10-CM | POA: Insufficient documentation

## 2016-08-25 DIAGNOSIS — Z950 Presence of cardiac pacemaker: Secondary | ICD-10-CM | POA: Insufficient documentation

## 2016-08-25 DIAGNOSIS — C649 Malignant neoplasm of unspecified kidney, except renal pelvis: Secondary | ICD-10-CM | POA: Insufficient documentation

## 2016-08-25 DIAGNOSIS — C7951 Secondary malignant neoplasm of bone: Secondary | ICD-10-CM | POA: Diagnosis not present

## 2016-08-25 DIAGNOSIS — C787 Secondary malignant neoplasm of liver and intrahepatic bile duct: Secondary | ICD-10-CM | POA: Diagnosis not present

## 2016-08-25 DIAGNOSIS — Z87891 Personal history of nicotine dependence: Secondary | ICD-10-CM | POA: Diagnosis not present

## 2016-08-25 DIAGNOSIS — E785 Hyperlipidemia, unspecified: Secondary | ICD-10-CM | POA: Insufficient documentation

## 2016-08-25 DIAGNOSIS — Z992 Dependence on renal dialysis: Secondary | ICD-10-CM | POA: Insufficient documentation

## 2016-08-25 DIAGNOSIS — L89152 Pressure ulcer of sacral region, stage 2: Secondary | ICD-10-CM | POA: Insufficient documentation

## 2016-08-25 DIAGNOSIS — I482 Chronic atrial fibrillation: Secondary | ICD-10-CM | POA: Diagnosis not present

## 2016-08-25 DIAGNOSIS — Z923 Personal history of irradiation: Secondary | ICD-10-CM | POA: Insufficient documentation

## 2016-08-25 DIAGNOSIS — S72001A Fracture of unspecified part of neck of right femur, initial encounter for closed fracture: Secondary | ICD-10-CM | POA: Insufficient documentation

## 2016-08-25 DIAGNOSIS — Z91013 Allergy to seafood: Secondary | ICD-10-CM | POA: Insufficient documentation

## 2016-08-25 DIAGNOSIS — I739 Peripheral vascular disease, unspecified: Secondary | ICD-10-CM | POA: Insufficient documentation

## 2016-08-25 DIAGNOSIS — Z9049 Acquired absence of other specified parts of digestive tract: Secondary | ICD-10-CM | POA: Insufficient documentation

## 2016-08-25 DIAGNOSIS — I132 Hypertensive heart and chronic kidney disease with heart failure and with stage 5 chronic kidney disease, or end stage renal disease: Secondary | ICD-10-CM | POA: Diagnosis not present

## 2016-08-25 DIAGNOSIS — Z91041 Radiographic dye allergy status: Secondary | ICD-10-CM | POA: Insufficient documentation

## 2016-08-25 DIAGNOSIS — E876 Hypokalemia: Secondary | ICD-10-CM | POA: Insufficient documentation

## 2016-08-25 DIAGNOSIS — I071 Rheumatic tricuspid insufficiency: Secondary | ICD-10-CM | POA: Diagnosis not present

## 2016-08-25 DIAGNOSIS — Z66 Do not resuscitate: Secondary | ICD-10-CM

## 2016-08-25 DIAGNOSIS — I2510 Atherosclerotic heart disease of native coronary artery without angina pectoris: Secondary | ICD-10-CM | POA: Insufficient documentation

## 2016-08-25 DIAGNOSIS — Z171 Estrogen receptor negative status [ER-]: Secondary | ICD-10-CM | POA: Diagnosis not present

## 2016-08-25 DIAGNOSIS — N186 End stage renal disease: Secondary | ICD-10-CM | POA: Insufficient documentation

## 2016-08-25 DIAGNOSIS — L89151 Pressure ulcer of sacral region, stage 1: Secondary | ICD-10-CM | POA: Insufficient documentation

## 2016-08-25 DIAGNOSIS — R0602 Shortness of breath: Secondary | ICD-10-CM | POA: Diagnosis present

## 2016-08-25 DIAGNOSIS — J9601 Acute respiratory failure with hypoxia: Secondary | ICD-10-CM | POA: Diagnosis not present

## 2016-08-25 DIAGNOSIS — Z9012 Acquired absence of left breast and nipple: Secondary | ICD-10-CM | POA: Insufficient documentation

## 2016-08-25 DIAGNOSIS — E871 Hypo-osmolality and hyponatremia: Secondary | ICD-10-CM | POA: Diagnosis not present

## 2016-08-25 DIAGNOSIS — Z7982 Long term (current) use of aspirin: Secondary | ICD-10-CM | POA: Insufficient documentation

## 2016-08-25 DIAGNOSIS — I420 Dilated cardiomyopathy: Secondary | ICD-10-CM | POA: Insufficient documentation

## 2016-08-25 DIAGNOSIS — L8990 Pressure ulcer of unspecified site, unspecified stage: Secondary | ICD-10-CM | POA: Insufficient documentation

## 2016-08-25 DIAGNOSIS — X58XXXA Exposure to other specified factors, initial encounter: Secondary | ICD-10-CM | POA: Insufficient documentation

## 2016-08-25 DIAGNOSIS — N390 Urinary tract infection, site not specified: Secondary | ICD-10-CM | POA: Insufficient documentation

## 2016-08-25 DIAGNOSIS — C50919 Malignant neoplasm of unspecified site of unspecified female breast: Secondary | ICD-10-CM | POA: Diagnosis not present

## 2016-08-25 DIAGNOSIS — R531 Weakness: Secondary | ICD-10-CM | POA: Diagnosis not present

## 2016-08-25 DIAGNOSIS — G9341 Metabolic encephalopathy: Secondary | ICD-10-CM | POA: Insufficient documentation

## 2016-08-25 DIAGNOSIS — Z8673 Personal history of transient ischemic attack (TIA), and cerebral infarction without residual deficits: Secondary | ICD-10-CM | POA: Diagnosis not present

## 2016-08-25 DIAGNOSIS — S2220XA Unspecified fracture of sternum, initial encounter for closed fracture: Secondary | ICD-10-CM | POA: Insufficient documentation

## 2016-08-25 DIAGNOSIS — I5033 Acute on chronic diastolic (congestive) heart failure: Secondary | ICD-10-CM

## 2016-08-25 DIAGNOSIS — M4854XA Collapsed vertebra, not elsewhere classified, thoracic region, initial encounter for fracture: Secondary | ICD-10-CM | POA: Insufficient documentation

## 2016-08-25 DIAGNOSIS — C799 Secondary malignant neoplasm of unspecified site: Secondary | ICD-10-CM

## 2016-08-25 DIAGNOSIS — Z79899 Other long term (current) drug therapy: Secondary | ICD-10-CM | POA: Insufficient documentation

## 2016-08-25 DIAGNOSIS — N2581 Secondary hyperparathyroidism of renal origin: Secondary | ICD-10-CM | POA: Insufficient documentation

## 2016-08-25 DIAGNOSIS — I959 Hypotension, unspecified: Secondary | ICD-10-CM | POA: Insufficient documentation

## 2016-08-25 DIAGNOSIS — I252 Old myocardial infarction: Secondary | ICD-10-CM | POA: Insufficient documentation

## 2016-08-25 DIAGNOSIS — D539 Nutritional anemia, unspecified: Secondary | ICD-10-CM | POA: Insufficient documentation

## 2016-08-25 DIAGNOSIS — Z880 Allergy status to penicillin: Secondary | ICD-10-CM | POA: Insufficient documentation

## 2016-08-25 DIAGNOSIS — I700 Atherosclerosis of aorta: Secondary | ICD-10-CM | POA: Insufficient documentation

## 2016-08-25 DIAGNOSIS — C7800 Secondary malignant neoplasm of unspecified lung: Secondary | ICD-10-CM | POA: Insufficient documentation

## 2016-08-25 DIAGNOSIS — Z515 Encounter for palliative care: Secondary | ICD-10-CM

## 2016-08-25 DIAGNOSIS — J439 Emphysema, unspecified: Secondary | ICD-10-CM | POA: Insufficient documentation

## 2016-08-25 DIAGNOSIS — I480 Paroxysmal atrial fibrillation: Secondary | ICD-10-CM | POA: Insufficient documentation

## 2016-08-25 DIAGNOSIS — Z7902 Long term (current) use of antithrombotics/antiplatelets: Secondary | ICD-10-CM | POA: Insufficient documentation

## 2016-08-25 LAB — URINALYSIS, COMPLETE (UACMP) WITH MICROSCOPIC
BACTERIA UA: NONE SEEN
BILIRUBIN URINE: NEGATIVE
GLUCOSE, UA: NEGATIVE mg/dL
KETONES UR: NEGATIVE mg/dL
NITRITE: NEGATIVE
PH: 6 (ref 5.0–8.0)
Protein, ur: 100 mg/dL — AB
SPECIFIC GRAVITY, URINE: 1.012 (ref 1.005–1.030)

## 2016-08-25 LAB — COMPREHENSIVE METABOLIC PANEL
ALBUMIN: 2.4 g/dL — AB (ref 3.5–5.0)
ALK PHOS: 141 U/L — AB (ref 38–126)
ALT: 22 U/L (ref 14–54)
ANION GAP: 11 (ref 5–15)
AST: 93 U/L — AB (ref 15–41)
BILIRUBIN TOTAL: 0.7 mg/dL (ref 0.3–1.2)
BUN: 30 mg/dL — AB (ref 6–20)
CALCIUM: 9.2 mg/dL (ref 8.9–10.3)
CO2: 26 mmol/L (ref 22–32)
CREATININE: 4.81 mg/dL — AB (ref 0.44–1.00)
Chloride: 100 mmol/L — ABNORMAL LOW (ref 101–111)
GFR calc Af Amer: 9 mL/min — ABNORMAL LOW (ref >60)
GFR calc non Af Amer: 8 mL/min — ABNORMAL LOW (ref >60)
GLUCOSE: 96 mg/dL (ref 65–99)
Potassium: 3.3 mmol/L — ABNORMAL LOW (ref 3.5–5.1)
SODIUM: 137 mmol/L (ref 135–145)
TOTAL PROTEIN: 7 g/dL (ref 6.5–8.1)

## 2016-08-25 LAB — BRAIN NATRIURETIC PEPTIDE: B Natriuretic Peptide: 808 pg/mL — ABNORMAL HIGH (ref 0.0–100.0)

## 2016-08-25 LAB — TROPONIN I: Troponin I: 0.24 ng/mL (ref <0.03)

## 2016-08-25 LAB — CBC WITH DIFFERENTIAL/PLATELET
BASOS PCT: 1 %
Basophils Absolute: 0 10*3/uL (ref 0–0.1)
Eosinophils Absolute: 0 10*3/uL (ref 0–0.7)
Eosinophils Relative: 1 %
HEMATOCRIT: 30.3 % — AB (ref 35.0–47.0)
HEMOGLOBIN: 9.8 g/dL — AB (ref 12.0–16.0)
Lymphocytes Relative: 25 %
Lymphs Abs: 1.1 10*3/uL (ref 1.0–3.6)
MCH: 33.1 pg (ref 26.0–34.0)
MCHC: 32.3 g/dL (ref 32.0–36.0)
MCV: 102.6 fL — ABNORMAL HIGH (ref 80.0–100.0)
MONOS PCT: 24 %
Monocytes Absolute: 1.1 10*3/uL — ABNORMAL HIGH (ref 0.2–0.9)
NEUTROS ABS: 2.2 10*3/uL (ref 1.4–6.5)
NEUTROS PCT: 49 %
Platelets: 147 10*3/uL — ABNORMAL LOW (ref 150–440)
RBC: 2.95 MIL/uL — AB (ref 3.80–5.20)
RDW: 16.7 % — ABNORMAL HIGH (ref 11.5–14.5)
WBC: 4.4 10*3/uL (ref 3.6–11.0)

## 2016-08-25 LAB — PROTIME-INR
INR: 1.18
Prothrombin Time: 15.1 seconds (ref 11.4–15.2)

## 2016-08-25 LAB — TSH: TSH: 11.517 u[IU]/mL — ABNORMAL HIGH (ref 0.350–4.500)

## 2016-08-25 LAB — T4, FREE: Free T4: 1.24 ng/dL — ABNORMAL HIGH (ref 0.61–1.12)

## 2016-08-25 MED ORDER — SODIUM CHLORIDE 0.9 % IV SOLN: 250.0000 mL | INTRAVENOUS | Status: DC | PRN

## 2016-08-25 MED ORDER — FUROSEMIDE 40 MG PO TABS
80.0000 mg | ORAL_TABLET | Freq: Every day | ORAL | Status: DC
  Administered 2016-08-27 – 2016-09-01 (×6): 80 mg via ORAL
  Filled 2016-08-25 (×7): qty 2

## 2016-08-25 MED ORDER — RENA-VITE PO TABS
1.0000 | ORAL_TABLET | Freq: Every day | ORAL | Status: DC
  Administered 2016-08-26 – 2016-09-01 (×7): 1 via ORAL
  Filled 2016-08-25 (×7): qty 1

## 2016-08-25 MED ORDER — FERROUS SULFATE 325 (65 FE) MG PO TABS
325.0000 mg | ORAL_TABLET | Freq: Every day | ORAL | Status: DC
  Administered 2016-08-26 – 2016-09-01 (×7): 325 mg via ORAL
  Filled 2016-08-25 (×7): qty 1

## 2016-08-25 MED ORDER — IRBESARTAN 150 MG PO TABS
300.0000 mg | ORAL_TABLET | Freq: Every day | ORAL | Status: DC
  Administered 2016-08-26 – 2016-09-01 (×6): 300 mg via ORAL
  Filled 2016-08-25 (×7): qty 2

## 2016-08-25 MED ORDER — ACETAMINOPHEN 325 MG PO TABS: 650.0000 mg | ORAL_TABLET | Freq: Four times a day (QID) | ORAL | Status: DC | PRN

## 2016-08-25 MED ORDER — IOPAMIDOL (ISOVUE-370) INJECTION 76%
75.0000 mL | Freq: Once | INTRAVENOUS | Status: AC | PRN
  Administered 2016-08-25: 75 mL via INTRAVENOUS

## 2016-08-25 MED ORDER — SEVELAMER CARBONATE 800 MG PO TABS
800.0000 mg | ORAL_TABLET | Freq: Three times a day (TID) | ORAL | Status: DC
  Administered 2016-08-26 – 2016-09-01 (×15): 800 mg via ORAL
  Filled 2016-08-25 (×17): qty 1

## 2016-08-25 MED ORDER — ONDANSETRON HCL 4 MG/2ML IJ SOLN: 4.0000 mg | Freq: Four times a day (QID) | INTRAMUSCULAR | Status: DC | PRN

## 2016-08-25 MED ORDER — FUROSEMIDE 10 MG/ML IJ SOLN
80.0000 mg | Freq: Once | INTRAMUSCULAR | Status: AC
  Administered 2016-08-25: 80 mg via INTRAVENOUS
  Filled 2016-08-25: qty 8

## 2016-08-25 MED ORDER — HEPARIN SODIUM (PORCINE) 5000 UNIT/ML IJ SOLN
5000.0000 [IU] | Freq: Three times a day (TID) | INTRAMUSCULAR | Status: DC
Start: 2016-08-25 — End: 2016-09-01
  Administered 2016-08-25 – 2016-09-01 (×17): 5000 [IU] via SUBCUTANEOUS
  Filled 2016-08-25 (×18): qty 1

## 2016-08-25 MED ORDER — SODIUM CHLORIDE 0.9% FLUSH
3.0000 mL | Freq: Two times a day (BID) | INTRAVENOUS | Status: DC
  Administered 2016-08-27 – 2016-09-01 (×11): 3 mL via INTRAVENOUS

## 2016-08-25 MED ORDER — HYDRALAZINE HCL 50 MG PO TABS
100.0000 mg | ORAL_TABLET | Freq: Two times a day (BID) | ORAL | Status: DC
  Filled 2016-08-25 (×2): qty 2

## 2016-08-25 MED ORDER — LEVOTHYROXINE SODIUM 100 MCG PO TABS
100.0000 ug | ORAL_TABLET | Freq: Every day | ORAL | Status: DC
  Administered 2016-08-26 – 2016-09-01 (×7): 100 ug via ORAL
  Filled 2016-08-25 (×8): qty 1

## 2016-08-25 MED ORDER — SODIUM CHLORIDE 0.9% FLUSH: 3.0000 mL | INTRAVENOUS | Status: DC | PRN

## 2016-08-25 MED ORDER — AMIODARONE HCL 200 MG PO TABS
200.0000 mg | ORAL_TABLET | Freq: Every day | ORAL | Status: DC
  Administered 2016-08-26: 200 mg via ORAL
  Filled 2016-08-25: qty 1

## 2016-08-25 MED ORDER — ACETAMINOPHEN 325 MG PO TABS
650.0000 mg | ORAL_TABLET | Freq: Four times a day (QID) | ORAL | Status: DC | PRN
  Administered 2016-08-27 (×2): 650 mg via ORAL
  Filled 2016-08-25 (×2): qty 2

## 2016-08-25 MED ORDER — PRAVASTATIN SODIUM 40 MG PO TABS
40.0000 mg | ORAL_TABLET | Freq: Every day | ORAL | Status: DC
  Administered 2016-08-25 – 2016-08-31 (×6): 40 mg via ORAL
  Filled 2016-08-25 (×6): qty 1

## 2016-08-25 MED ORDER — ACETAMINOPHEN 650 MG RE SUPP: 650.0000 mg | Freq: Four times a day (QID) | RECTAL | Status: DC | PRN

## 2016-08-25 MED ORDER — HYDROCORTISONE NA SUCCINATE PF 250 MG IJ SOLR
200.0000 mg | Freq: Once | INTRAMUSCULAR | Status: AC
  Administered 2016-08-25: 200 mg via INTRAVENOUS
  Filled 2016-08-25: qty 200

## 2016-08-25 MED ORDER — ASPIRIN EC 81 MG PO TBEC
81.0000 mg | DELAYED_RELEASE_TABLET | Freq: Every day | ORAL | Status: DC
  Administered 2016-08-26 – 2016-09-01 (×6): 81 mg via ORAL
  Filled 2016-08-25 (×7): qty 1

## 2016-08-25 MED ORDER — AMLODIPINE BESYLATE 10 MG PO TABS
10.0000 mg | ORAL_TABLET | Freq: Every day | ORAL | Status: DC
  Administered 2016-08-26: 08:00:00 10 mg via ORAL
  Filled 2016-08-25 (×2): qty 1

## 2016-08-25 MED ORDER — ANASTROZOLE 1 MG PO TABS
1.0000 mg | ORAL_TABLET | Freq: Every day | ORAL | Status: DC
  Administered 2016-08-26 – 2016-09-01 (×6): 1 mg via ORAL
  Filled 2016-08-25 (×7): qty 1

## 2016-08-25 MED ORDER — DIPHENHYDRAMINE HCL 50 MG/ML IJ SOLN
50.0000 mg | Freq: Once | INTRAMUSCULAR | Status: AC
  Administered 2016-08-25: 19:00:00 50 mg via INTRAVENOUS
  Filled 2016-08-25 (×2): qty 1

## 2016-08-25 MED ORDER — CINACALCET HCL 30 MG PO TABS
30.0000 mg | ORAL_TABLET | Freq: Every day | ORAL | Status: DC
  Administered 2016-08-27 – 2016-08-31 (×4): 30 mg via ORAL
  Filled 2016-08-25 (×6): qty 1

## 2016-08-25 MED ORDER — SENNA 8.6 MG PO TABS
1.0000 | ORAL_TABLET | Freq: Two times a day (BID) | ORAL | Status: DC
  Administered 2016-08-26 – 2016-08-28 (×6): 8.6 mg via ORAL
  Filled 2016-08-25 (×8): qty 1

## 2016-08-25 MED ORDER — ONDANSETRON HCL 4 MG PO TABS: 4.0000 mg | ORAL_TABLET | Freq: Four times a day (QID) | ORAL | Status: DC | PRN

## 2016-08-25 MED ORDER — SODIUM CHLORIDE 0.9% FLUSH
3.0000 mL | Freq: Two times a day (BID) | INTRAVENOUS | Status: DC
  Administered 2016-08-25 – 2016-08-26 (×3): 3 mL via INTRAVENOUS

## 2016-08-25 MED ORDER — ISOSORBIDE MONONITRATE ER 30 MG PO TB24
30.0000 mg | ORAL_TABLET | Freq: Every day | ORAL | Status: DC
  Administered 2016-08-26 – 2016-09-01 (×6): 30 mg via ORAL
  Filled 2016-08-25 (×7): qty 1

## 2016-08-25 MED ORDER — DIPHENHYDRAMINE HCL 25 MG PO TABS
25.0000 mg | ORAL_TABLET | Freq: Four times a day (QID) | ORAL | Status: DC | PRN
  Administered 2016-08-31: 22:00:00 25 mg via ORAL
  Filled 2016-08-25 (×3): qty 1

## 2016-08-25 MED ORDER — LOSARTAN POTASSIUM 50 MG PO TABS: 100.0000 mg | ORAL_TABLET | Freq: Every day | ORAL | Status: DC

## 2016-08-25 MED ORDER — GABAPENTIN 100 MG PO CAPS
100.0000 mg | ORAL_CAPSULE | Freq: Three times a day (TID) | ORAL | Status: DC
  Administered 2016-08-27 – 2016-09-01 (×15): 100 mg via ORAL
  Filled 2016-08-25 (×15): qty 1

## 2016-08-25 MED ORDER — PANTOPRAZOLE SODIUM 40 MG PO TBEC
40.0000 mg | DELAYED_RELEASE_TABLET | Freq: Every day | ORAL | Status: DC
  Administered 2016-08-26 – 2016-09-01 (×6): 40 mg via ORAL
  Filled 2016-08-25 (×7): qty 1

## 2016-08-25 MED ORDER — CLOPIDOGREL BISULFATE 75 MG PO TABS
75.0000 mg | ORAL_TABLET | Freq: Every day | ORAL | Status: DC
  Administered 2016-08-26 – 2016-09-01 (×6): 75 mg via ORAL
  Filled 2016-08-25 (×7): qty 1

## 2016-08-25 MED ORDER — MIRTAZAPINE 15 MG PO TABS
15.0000 mg | ORAL_TABLET | Freq: Every day | ORAL | Status: DC
  Administered 2016-08-26 – 2016-08-31 (×6): 15 mg via ORAL
  Filled 2016-08-25 (×7): qty 1

## 2016-08-25 MED ORDER — BISACODYL 10 MG RE SUPP: 10.0000 mg | Freq: Every day | RECTAL | Status: DC | PRN

## 2016-08-25 NOTE — ED Provider Notes (Signed)
[facility] Emergency Department Provider Note  ____________________________________________   None    (approximate)  I have reviewed the triage vital signs and the nursing notes.   HISTORY  Chief Complaint Shortness of Breath    HPI Patricia Woodard is a 83 y.o. female who comes to the emergency department via EMS for hypoxia at home. She has a complex past medical history including stage IV metastatic breast cancer, end-stage renal disease for which she has hemodialysis (last dialyzed yesterday) and severe congestive heart failure. She was recently at a nursing home in which she was discharged from the nursing home she went home with home health. The home health nurse came today and noted her oxygen saturations were in the mid to low 80s and she was somewhat hypotensive so they called 911.   Past Medical History:  Diagnosis Date  . Anemia   . Arrhythmia   . Cancer ([place])    breast ca - left, kidney  . Dialysis AV fistula infection ([place])    Left arm Mon, Wed, Fri dialysis  . Hypertension   . MI, old   . Renal insufficiency    dialysis  . Stroke [facility])    2009    Patient Active Problem List   Diagnosis Date Noted  . Acute respiratory failure with hypoxia ([place]) 08/25/2016  . Acute on chronic diastolic CHF (congestive heart failure) ([place]) 08/25/2016  . End stage renal disease ([place]) 08/25/2016  . Generalized weakness 08/25/2016  . Hypokalemia 08/25/2016  . Pressure injury of skin 08/25/2016  . Goals of care, counseling/discussion 08/19/2016  . Macrocytic anemia 08/05/2016  . Malignant neoplasm of left breast in female, estrogen receptor negative ([place]) 08/05/2016  . S/P ORIF (open reduction internal fixation) fracture 05/17/2016  . Hypotension 05/17/2016  . ESRD on dialysis ([place]) 05/17/2016  . Chronic atrial fibrillation ([place]) 05/17/2016  . Diastolic CHF, chronic ([place]) 05/17/2016  . Hyponatremia 05/17/2016  . Essential hypertension 05/17/2016    . Multiple lung nodules on CT 05/17/2016  . Respiratory failure, acute ([place]) 05/14/2016  . Closed right hip fracture ([place]) 05/13/2016    Past Surgical History:  Procedure Laterality Date  . APPENDECTOMY    . AV FISTULA PLACEMENT Left   . CHOLECYSTECTOMY    . HIP FRACTURE SURGERY Left   . ICD LEAD REMOVAL N/A 11/04/2014   Procedure: ICD LEAD REMOVAL;  Surgeon: Isaias Cowman, MD;  Location: [facility] ORS;  Service: Cardiovascular;  Laterality: N/A;  . INSERT / REPLACE / REMOVE PACEMAKER    . kidney tumor Right   . ORIF HIP FRACTURE Right 05/14/2016   Procedure: OPEN REDUCTION INTERNAL FIXATION HIP;  Surgeon: Earnestine Leys, MD;  Location: [facility] ORS;  Service: Orthopedics;  Laterality: Right;    Prior to Admission medications   Medication Sig Start Date End Date Taking? Authorizing Provider  acetaminophen (TYLENOL) 325 MG tablet Take 2 tablets (650 mg total) by mouth every 6 (six) hours as needed for mild pain (or Fever >/= 101). 05/17/16  Yes Theodoro Grist, MD  Amino Acid Infusion (PROSOL) 20 % SOLN  06/15/16  Yes Historical Provider, MD  amiodarone (PACERONE) 200 MG tablet Take 200 mg by mouth daily.   Yes Historical Provider, MD  amLODipine (NORVASC) 10 MG tablet Take 10 mg by mouth daily.   Yes Historical Provider, MD  aspirin EC 81 MG tablet Take 81 mg by mouth daily.   Yes Historical Provider, MD  Azilsartan Medoxomil (EDARBI) 80 MG TABS Take 80 mg  by mouth daily.   Yes Historical Provider, MD  bisacodyl (DULCOLAX) 10 MG suppository Place 1 suppository (10 mg total) rectally daily as needed for moderate constipation. 05/17/16  Yes Theodoro Grist, MD  carvedilol (COREG) 6.25 MG tablet Take 6.25 mg by mouth 2 (two) times daily with a meal.  07/30/16  Yes Historical Provider, MD  cloNIDine (CATAPRES) 0.1 MG tablet Take 0.1 mg by mouth 3 (three) times daily. As directed   Yes Historical Provider, MD  clopidogrel (PLAVIX) 75 MG tablet Take 75 mg by mouth daily.   Yes Historical Provider, MD   diphenhydrAMINE (BENADRYL) 25 MG tablet Take 25 mg by mouth every 6 (six) hours as needed.   Yes Historical Provider, MD  esomeprazole (NEXIUM) 20 MG capsule Take 20 mg by mouth daily at 12 noon.   Yes Historical Provider, MD  ferrous sulfate 325 (65 FE) MG tablet Take 1 tablet (325 mg total) by mouth daily with breakfast. 05/18/16  Yes Theodoro Grist, MD  furosemide (LASIX) 80 MG tablet TAKE ONE TABLET BY MOUTH EVERY DAY 12/04/11  Yes Jackolyn Confer, MD  gabapentin (NEURONTIN) 300 MG capsule Take 300 mg by mouth 3 (three) times daily.  07/12/16  Yes Historical Provider, MD  hydrALAZINE (APRESOLINE) 100 MG tablet Take 100 mg by mouth 2 (two) times daily.   Yes Historical Provider, MD  isosorbide mononitrate (IMDUR) 30 MG 24 hr tablet Take 30 mg by mouth daily.  07/05/16  Yes Historical Provider, MD  levothyroxine (SYNTHROID, LEVOTHROID) 100 MCG tablet Take 100 mcg by mouth daily before breakfast.   Yes Historical Provider, MD  losartan (COZAAR) 100 MG tablet TAKE ONE TABLET BY MOUTH ONCE DAILY 10/15/14  Yes Historical Provider, MD  lovastatin (MEVACOR) 40 MG tablet TAKE ONE TABLET BY MOUTH AT BEDTIME 05/29/11  Yes Jackolyn Confer, MD  mirtazapine (REMERON) 15 MG tablet Take 15 mg by mouth at bedtime.   Yes Historical Provider, MD  multivitamin (RENA-VIT) TABS tablet Take 1 tablet by mouth daily. 0.8mg    Yes Historical Provider, MD  senna (SENOKOT) 8.6 MG TABS tablet Take 1 tablet (8.6 mg total) by mouth 2 (two) times daily. 05/17/16  Yes Theodoro Grist, MD  SENSIPAR 30 MG tablet TAKE ONE TABLET BY MOUTH AT DINNER. Patient taking differently: TAKE ONE TABLET BY MOUTH ON DIALYSIS DAYS. 02/27/11  Yes Jackolyn Confer, MD  sevelamer carbonate (RENVELA) 800 MG tablet Take 800 mg by mouth 3 (three) times daily with meals.   Yes Historical Provider, MD  anastrozole (ARIMIDEX) 1 MG tablet Take 1 mg by mouth daily.    Historical Provider, MD    Allergies Penicillins; Ivp dye [iodinated diagnostic agents];  and Shellfish allergy  History reviewed. No pertinent family history.  Social History Social History  Substance Use Topics  . Smoking status: Former Smoker    Packs/day: 0.50    Years: 60.00    Quit date: 2017  . Smokeless tobacco: Former Systems developer    Quit date: 04/2016  . Alcohol use No    Review of Systems Constitutional: No fever/chills Eyes: No visual changes. ENT: No sore throat. Cardiovascular: Positive chest pain. Respiratory: Positive shortness of breath. Gastrointestinal: No abdominal pain.  No nausea, no vomiting.  No diarrhea.  No constipation. Genitourinary: Negative for dysuria. Musculoskeletal: Negative for back pain. Skin: Negative for rash. Neurological: Negative for headaches, focal weakness or numbness.  10-point ROS otherwise negative.  ____________________________________________   PHYSICAL EXAM:  VITAL SIGNS: ED Triage Vitals  Enc Vitals  Group     BP 08/25/16 1314 (!) 93/45     Pulse Rate 08/25/16 1314 (!) 59     Resp --      Temp 08/25/16 1314 98.7 F (37.1 C)     Temp Source 08/25/16 1314 Oral     SpO2 08/25/16 1314 98 %     Weight 08/25/16 1315 125 lb (56.7 kg)     Height 08/25/16 1315 5\' 7"  (1.702 m)     Head Circumference --      Peak Flow --      Pain Score 08/25/16 1325 0     Pain Loc --      Pain Edu? --      Excl. in [place]? --     Constitutional: Cachectic elevated respiratory rate Eyes: PERRL EOMI. Head: Atraumatic. Nose: No congestion/rhinnorhea. Mouth/Throat: No trismus Neck: No stridor.   Cardiovascular: Normal rate, regular rhythm. Grossly normal heart sounds.  Good peripheral circulation. Respiratory: Elevated respiratory crackles throughout Gastrointestinal: Soft nondistended nontender no rebound no guarding no peritonitis no McBurney's tenderness negative Rovsing's no costovertebral tenderness negative Murphy's Musculoskeletal: Legs equal in size Neurologic:   No gross focal neurologic deficits are appreciated. Skin:   Skin is warm, dry and intact. No rash noted.   ____________________________________________   LABS (all labs ordered are listed, but only abnormal results are displayed)  Labs Reviewed  CBC WITH DIFFERENTIAL/PLATELET - Abnormal; Notable for the following:       Result Value   RBC 2.95 (*)    Hemoglobin 9.8 (*)    HCT 30.3 (*)    MCV 102.6 (*)    RDW 16.7 (*)    Platelets 147 (*)    Monocytes Absolute 1.1 (*)    All other components within normal limits  COMPREHENSIVE METABOLIC PANEL - Abnormal; Notable for the following:    Potassium 3.3 (*)    Chloride 100 (*)    BUN 30 (*)    Creatinine, Ser 4.81 (*)    Albumin 2.4 (*)    AST 93 (*)    Alkaline Phosphatase 141 (*)    GFR calc non Af Amer 8 (*)    GFR calc Af Amer 9 (*)    All other components within normal limits  TROPONIN I - Abnormal; Notable for the following:    Troponin I 0.24 (*)    All other components within normal limits  TSH - Abnormal; Notable for the following:    TSH 11.517 (*)    All other components within normal limits  T4, FREE - Abnormal; Notable for the following:    Free T4 1.24 (*)    All other components within normal limits  URINALYSIS, COMPLETE (UACMP) WITH MICROSCOPIC - Abnormal; Notable for the following:    Color, Urine AMBER (*)    APPearance CLOUDY (*)    Hgb urine dipstick MODERATE (*)    Protein, ur 100 (*)    Leukocytes, UA LARGE (*)    Squamous Epithelial / LPF 0-5 (*)    All other components within normal limits  BRAIN NATRIURETIC PEPTIDE - Abnormal; Notable for the following:    B Natriuretic Peptide 808.0 (*)    All other components within normal limits  BASIC METABOLIC PANEL - Abnormal; Notable for the following:    Sodium 133 (*)    Chloride 98 (*)    Glucose, Bld 143 (*)    BUN 40 (*)    Creatinine, Ser 5.53 (*)    GFR calc  non Af Amer 6 (*)    GFR calc Af Amer 7 (*)    All other components within normal limits  CBC - Abnormal; Notable for the following:    WBC 3.4  (*)    RBC 2.95 (*)    Hemoglobin 9.7 (*)    HCT 30.1 (*)    MCV 101.8 (*)    RDW 16.0 (*)    All other components within normal limits  T4, FREE - Abnormal; Notable for the following:    Free T4 1.24 (*)    All other components within normal limits  CULTURE, EXPECTORATED SPUTUM-ASSESSMENT  PROTIME-INR   ____________________________________________  EKG  ED ECG REPORT I, Darel Hong, the attending physician, personally viewed and interpreted this ECG.  Date: 08/25/2016 Atrial paced rhythm with first-degree AV block rate of 60 prolonged QTC no ST elevation abnormal EKG ____________________________________________  RADIOLOGY  Chest x-ray with no obvious infection ____________________________________________   PROCEDURES  Procedure(s) performed: no  Procedures  Critical Care performed: no  ____________________________________________   INITIAL IMPRESSION / ASSESSMENT AND PLAN / ED COURSE  Pertinent labs & imaging results that were available during my care of the patient were reviewed by me and considered in my medical decision making (see chart for details).  The differential for this patient's hypoxia and shortness of breath is broad. It includes congestive heart failure, infection, progression of her metastatic disease, as well as pulmonary embolism. Chest x-ray does not show a convincing pneumonia and she is not largely fluid overloaded. This leaves progression of metastatic disease versus pulmonary embolism. She reports an allergy to IV contrast but not anaphylaxis. Given her oxygen requirements she will requires inpatient admission but I will give her steroids now in order a CT PA.      ____________________________________________   FINAL CLINICAL IMPRESSION(S) / ED DIAGNOSES  Final diagnoses:  SOB (shortness of breath)      NEW MEDICATIONS STARTED DURING THIS VISIT:  Current Discharge Medication List       Note:  This document was prepared  using Dragon voice recognition software and may include unintentional dictation errors.     Darel Hong, MD 08/26/16 1257

## 2016-08-25 NOTE — H&P (Signed)
[facility] [place] NAME: Patricia Woodard    MR#:  [id number]  DATE OF BIRTH:  22-Mar-1932  DATE OF ADMISSION:  08/25/2016  PRIMARY CARE PHYSICIAN: Madelyn Brunner, MD   REQUESTING/REFERRING PHYSICIAN:   CHIEF COMPLAINT:   Chief Complaint  Patient presents with  . Shortness of Breath    HISTORY OF PRESENT ILLNESS: Patricia Woodard  is a 83 y.o. 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 According to the patient's family, patient was discharged from skilled nursing facility due to poor progression, she's been managed at home by her family last week. Home health nurse came to visit her today, she noted her blood pressure being somewhat low, her oxygen levels were also low in 80s. She was brought to emergency room for further evaluation and treatment. Chest x-ray was remarkable for cardiomegaly, some fluid retention in interlobar fissures, concerning for congestive heart failure. Hospitalist services were contacted for admission read about the patient's family, patient has been weak, sleeping a lot recently. Upon further investigation, it appears that she's been on high doses of Neurontin at 300 mg 3 times daily, which should be too high a dose. 4. End-stage renal disease patient.  PAST MEDICAL HISTORY:   Past Medical History:  Diagnosis Date  . Anemia   . Arrhythmia   . Cancer ([place])    breast ca - left, kidney  . Dialysis AV fistula infection ([place])    Left arm Mon, Wed, Fri dialysis  . Hypertension   . MI, old   . Renal insufficiency    dialysis  . Stroke [facility])    2009    PAST SURGICAL HISTORY: Past Surgical History:   Procedure Laterality Date  . APPENDECTOMY    . AV FISTULA PLACEMENT Left   . CHOLECYSTECTOMY    . HIP FRACTURE SURGERY Left   . ICD LEAD REMOVAL N/A 11/04/2014   Procedure: ICD LEAD REMOVAL;  Surgeon: Isaias Cowman, MD;  Location: [facility] ORS;  Service: Cardiovascular;  Laterality: N/A;  . INSERT / REPLACE / REMOVE PACEMAKER    . kidney tumor Right   . ORIF HIP FRACTURE Right 05/14/2016   Procedure: OPEN REDUCTION INTERNAL FIXATION HIP;  Surgeon: Earnestine Leys, MD;  Location: [facility] ORS;  Service: Orthopedics;  Laterality: Right;    SOCIAL HISTORY:  Social History  Substance Use Topics  . Smoking status: Former Smoker    Packs/day: 0.50    Years: 60.00    Quit date: 2017  . Smokeless tobacco: Former Systems developer    Quit date: 04/2016  . Alcohol use No    FAMILY HISTORY: No early coronary artery disease  DRUG ALLERGIES:  Allergies  Allergen Reactions  . Penicillins Itching  . Ivp Dye [Iodinated Diagnostic Agents] Rash  . Shellfish Allergy Rash    Review of Systems  Unable to perform ROS: Mental acuity    MEDICATIONS AT HOME:  Prior to Admission medications   Medication Sig Start Date End Date Taking? Authorizing Provider  acetaminophen (TYLENOL) 325 MG tablet Take 2 tablets (650 mg total) by mouth every 6 (six) hours as needed for mild pain (or Fever >/= 101). 05/17/16  Yes Theodoro Grist, MD  Amino Acid Infusion (PROSOL) 20 % SOLN  06/15/16  Yes Historical Provider, MD  amiodarone (PACERONE) 200 MG tablet Take 200 mg by mouth daily.   Yes Historical Provider, MD  amLODipine (NORVASC) 10 MG tablet Take 10 mg by mouth daily.   Yes Historical Provider, MD  aspirin EC 81 MG tablet Take 81 mg by mouth daily.   Yes Historical Provider, MD  Azilsartan Medoxomil (EDARBI) 80 MG TABS Take 80 mg by mouth daily.   Yes Historical Provider, MD  bisacodyl (DULCOLAX) 10 MG suppository Place 1 suppository (10 mg total) rectally daily as needed for moderate constipation. 05/17/16  Yes Theodoro Grist, MD  carvedilol (COREG) 6.25 MG tablet Take 6.25 mg by mouth 2 (two) times daily with a meal.  07/30/16  Yes Historical Provider, MD  cloNIDine (CATAPRES) 0.1 MG tablet Take 0.1 mg by mouth 3 (three) times daily. As directed   Yes Historical Provider, MD  clopidogrel (PLAVIX) 75 MG tablet Take 75 mg by mouth daily.   Yes Historical Provider, MD  diphenhydrAMINE (BENADRYL) 25 MG tablet Take 25 mg by mouth every 6 (six) hours as needed.   Yes Historical Provider, MD  esomeprazole (NEXIUM) 20 MG capsule Take 20 mg by mouth daily at 12 noon.   Yes Historical Provider, MD  ferrous sulfate 325 (65 FE) MG tablet Take 1 tablet (325 mg total) by mouth daily with breakfast. 05/18/16  Yes Theodoro Grist, MD  furosemide (LASIX) 80 MG tablet TAKE ONE TABLET BY MOUTH EVERY DAY 12/04/11  Yes Jackolyn Confer, MD  gabapentin (NEURONTIN) 300 MG capsule Take 300 mg by mouth 3 (three) times daily.  07/12/16  Yes Historical Provider, MD  hydrALAZINE (APRESOLINE) 100 MG tablet Take 100 mg by mouth 2 (two) times daily.   Yes Historical Provider, MD  isosorbide mononitrate (IMDUR) 30 MG 24 hr tablet Take 30 mg by mouth daily.  07/05/16  Yes Historical Provider, MD  levothyroxine (SYNTHROID, LEVOTHROID) 100 MCG tablet Take 100 mcg by mouth daily before breakfast.   Yes Historical Provider, MD  losartan (COZAAR) 100 MG tablet TAKE ONE TABLET BY MOUTH ONCE DAILY 10/15/14  Yes Historical Provider, MD  lovastatin (MEVACOR) 40 MG tablet TAKE ONE TABLET BY MOUTH AT BEDTIME 05/29/11  Yes Jackolyn Confer, MD  mirtazapine (REMERON) 15 MG tablet Take 15 mg by mouth at bedtime.   Yes Historical Provider, MD  multivitamin (RENA-VIT) TABS tablet Take 1 tablet by mouth daily. 0.8mg    Yes Historical Provider, MD  senna (SENOKOT) 8.6 MG TABS tablet Take 1 tablet (8.6 mg total) by mouth 2 (two) times daily. 05/17/16  Yes Theodoro Grist, MD  SENSIPAR 30 MG tablet TAKE ONE TABLET BY MOUTH AT DINNER. Patient taking differently: TAKE ONE  TABLET BY MOUTH ON DIALYSIS DAYS. 02/27/11  Yes Jackolyn Confer, MD  sevelamer carbonate (RENVELA) 800 MG tablet Take 800 mg by mouth 3 (three) times daily with meals.   Yes Historical Provider, MD  anastrozole (ARIMIDEX) 1 MG tablet Take 1 mg by mouth daily.    Historical Provider, MD      PHYSICAL EXAMINATION:   VITAL SIGNS: Blood pressure (!) 120/56, pulse (!) 49, temperature 98.7 F (37.1 C), temperature source Oral, resp. rate 17, height 5\' 7"  (1.702 m), weight 56.7 kg (125 lb), SpO2 91 %.  GENERAL:  83 y.o.-year-old patient lying in the bed with no acute distress, Somnolent, however, able to open her eyes and really answer questions yes or no, drifts back to sleep.  EYES: Pupils equal, round, reactive  to light and accommodation. No scleral icterus. Extraocular muscles intact.  HEENT: Head atraumatic, normocephalic. Oropharynx and nasopharynx clear.  NECK:  Supple, no jugular venous distention. No thyroid enlargement, no tenderness.  LUNGS: Diminished breath sounds bilaterally, crackles on the left base,  no wheezing, few scattered rales,rhonchi on the right, no crepitations. No use of accessory muscles of respiration.  CARDIOVASCULAR: S1, S2 is regular. No murmurs, rubs, or gallops.  ABDOMEN: Soft, nontender, nondistended. Bowel sounds present. No organomegaly or mass.  EXTREMITIES: No pedal edema, cyanosis, or clubbing.  NEUROLOGIC: Cranial nerves II through XII are intact. Muscle strength 5/5 in all extremities. Sensation intact. Gait not checked.  PSYCHIATRIC: The patient is very somnolent, difficult to assess orientation due to somnolence, able to open eyes and briefly say is in no.  SKIN: No obvious rash, lesion, or ulcer.   LABORATORY PANEL:   CBC  Recent Labs Lab 08/25/16 1432  WBC 4.4  HGB 9.8*  HCT 30.3*  PLT 147*  MCV 102.6*  MCH 33.1  MCHC 32.3  RDW 16.7*  LYMPHSABS 1.1  MONOABS 1.1*  EOSABS 0.0  BASOSABS 0.0    ------------------------------------------------------------------------------------------------------------------  Chemistries   Recent Labs Lab 08/25/16 1432  NA 137  K 3.3*  CL 100*  CO2 26  GLUCOSE 96  BUN 30*  CREATININE 4.81*  CALCIUM 9.2  AST 93*  ALT 22  ALKPHOS 141*  BILITOT 0.7   ------------------------------------------------------------------------------------------------------------------  Cardiac Enzymes  Recent Labs Lab 08/25/16 1432  TROPONINI 0.24*   ------------------------------------------------------------------------------------------------------------------  RADIOLOGY: Dg Chest Port 1 View  Result Date: 08/25/2016 CLINICAL DATA:  Tightness of lung cancer on Thursday. Shortness of breath, hypoxic EXAM: PORTABLE CHEST 1 VIEW COMPARISON:  PET-CT 08/18/2016 FINDINGS: There is bilateral chronic interstitial thickening. Left apical pulmonary nodule again noted similar in appearance to the prior exam. Other bilateral pulmonary nodules seen on a recent PET-CT are not well delineated on the current exam. There is no focal consolidation. There is no pleural effusion or pneumothorax. There is stable cardiomegaly. There is a dual lead cardiac pacemaker. The osseous structures are unremarkable. IMPRESSION: 1. No acute cardiopulmonary disease. 2. Cardiomegaly. Electronically Signed   By: Kathreen Devoid   On: 08/25/2016 14:14    EKG: Orders placed or performed during the hospital encounter of 08/25/16  . ED EKG  . ED EKG   EKG in the emergency room revealed atrial paced rhythm with prolonged AV conduction, right superior axis deviation, poor R-wave progression in V3, T depression in V6. No acute distress. CT changes   IMPRESSION AND PLAN:  Active Problems:   Acute respiratory failure with hypoxia (HCC)   Acute on chronic diastolic CHF (congestive heart failure) (HCC)   End stage renal disease (HCC)   Generalized weakness   Hypokalemia  #1. Acute  respiratory failure with hypoxia, chest x-ray was unremarkable, however, concerning for fluid retention in interlobar fissure, we'll give one dose of Lasix, watching blood pressure readings closely, continue oxygen therapy as needed, CT angiogram is scheduled to rule out pulmonary embolism #2. Acute on chronic diastolic CHF, continue ARB, Lasix, hemodialysis as previous as scheduled, continue oxygen therapy #3. Hypotension, hold clonidine, resume all other blood pressure medications #4. Bradycardia, hold Coreg, clonidine, follow heart rate closely #5. Altered mental state, weakness, somnolence, could be related to Neurontin, decrease dose 100 mg 3 times daily #6. End-stage disease, nephrology consultation is requested  All the records are reviewed and case discussed with ED provider. Management plans discussed with the patient, family  and they are in agreement.  CODE STATUS: Code Status History    Date Active Date Inactive Code Status Order ID Comments User Context   05/14/2016  2:41 PM 05/17/2016  7:46 PM Full Code [id number]  Laverle Hobby, MD Inpatient   05/13/2016  8:25 PM 05/14/2016  2:41 PM Full Code [id number]  Demetrios Loll, MD ED   05/13/2016  7:03 PM 05/13/2016  8:25 PM Full Code [id number]  Earnestine Leys, MD ED   11/04/2014  2:43 PM 11/04/2014  6:09 PM Full Code [id number]  Isaias Cowman, MD Inpatient    Advance Directive Documentation   Flowsheet Row Most Recent Value  Type of Advance Directive  Healthcare Power of Attorney  Pre-existing out of facility DNR order (yellow form or pink MOST form)  No data  "MOST" Form in Place?  No data       TOTAL TIME TAKING CARE OF THIS PATIENT: 50 minutes.    Theodoro Grist M.D on 08/25/2016 at 4:23 PM  Between 7am to 6pm - Pager - [phone] After 6pm go to [email or web address] - password [email or web address] [facility]  Office  [phone]  CC: Primary care physician; Madelyn Brunner, MD

## 2016-08-25 NOTE — ED Notes (Signed)
ED Provider at bedside. 

## 2016-08-25 NOTE — ED Triage Notes (Signed)
Pt was dx with lung ca on Thursday - had her first home health visit today and they found low sp02 of [phone] at home.

## 2016-08-25 NOTE — ED Notes (Signed)
Per CT, patient is to receive hydrocortisone 4 hrs prior to CT scan and benadryl 1 hour before CT scan.

## 2016-08-25 NOTE — ED Notes (Signed)
Admitting MD asked for pt to be placed on 2 L nasal cannula. Pt was dropping down into mid 80's on RA while MD was assessing pt.

## 2016-08-26 LAB — CBC
HEMATOCRIT: 30.1 % — AB (ref 35.0–47.0)
HEMOGLOBIN: 9.7 g/dL — AB (ref 12.0–16.0)
MCH: 33 pg (ref 26.0–34.0)
MCHC: 32.4 g/dL (ref 32.0–36.0)
MCV: 101.8 fL — AB (ref 80.0–100.0)
PLATELETS: 152 10*3/uL (ref 150–440)
RBC: 2.95 MIL/uL — AB (ref 3.80–5.20)
RDW: 16 % — ABNORMAL HIGH (ref 11.5–14.5)
WBC: 3.4 10*3/uL — AB (ref 3.6–11.0)

## 2016-08-26 LAB — BASIC METABOLIC PANEL
ANION GAP: 13 (ref 5–15)
BUN: 40 mg/dL — ABNORMAL HIGH (ref 6–20)
CHLORIDE: 98 mmol/L — AB (ref 101–111)
CO2: 22 mmol/L (ref 22–32)
Calcium: 8.9 mg/dL (ref 8.9–10.3)
Creatinine, Ser: 5.53 mg/dL — ABNORMAL HIGH (ref 0.44–1.00)
GFR calc non Af Amer: 6 mL/min — ABNORMAL LOW (ref >60)
GFR, EST AFRICAN AMERICAN: 7 mL/min — AB (ref >60)
Glucose, Bld: 143 mg/dL — ABNORMAL HIGH (ref 65–99)
POTASSIUM: 3.6 mmol/L (ref 3.5–5.1)
SODIUM: 133 mmol/L — AB (ref 135–145)

## 2016-08-26 LAB — T4, FREE: Free T4: 1.24 ng/dL — ABNORMAL HIGH (ref 0.61–1.12)

## 2016-08-26 MED ORDER — AMIODARONE HCL 200 MG PO TABS
100.0000 mg | ORAL_TABLET | Freq: Every day | ORAL | Status: DC
  Administered 2016-08-27 – 2016-09-01 (×5): 100 mg via ORAL
  Filled 2016-08-26 (×6): qty 1

## 2016-08-26 NOTE — Progress Notes (Signed)
Hemodialysis completed. 

## 2016-08-26 NOTE — Progress Notes (Signed)
[facility] [place] NAME: Patricia Woodard    MR#:  [id number]  DATE OF BIRTH:  [birth year]  SUBJECTIVE; seen at bedside, admitted for shortness of breath, confusion. Patient is slightly alert than yesterday, has mild shortness of breath. Appears very cachectic.   CHIEF COMPLAINT:   Chief Complaint  Patient presents with  . Shortness of Breath    REVIEW OF SYSTEMS:   Review of Systems  Unable to perform ROS: Acuity of condition   Appears confused.  DRUG ALLERGIES:   Allergies  Allergen Reactions  . Penicillins Itching  . Ivp Dye [Iodinated Diagnostic Agents] Rash  . Shellfish Allergy Rash    VITALS:  Blood pressure (!) 132/52, pulse 60, temperature 98.9 F (37.2 C), temperature source Oral, resp. rate 18, height 5\' 7"  (1.702 m), weight 57.4 kg (126 lb 9.6 oz), SpO2 100 %.  PHYSICAL EXAMINATION:  GENERAL:  83 y.o.-year-old patient lying in the bed w,cachectic.   EYES: Pupils equal, round, reactive to light .no scleral icterus. Marland Kitchen  HEENT: Head atraumatic, normocephalic. Oropharynx and nasopharynx clear.  NECK:  Supple, no jugular venous distention. No thyroid enlargement, no tenderness.  LUNGS: decreased  breath sounds bilaterally. Crackles in the left base,  CARDIOVASCULAR: S1, S2 normal. No murmurs, rubs, or gallops.  ABDOMEN: Soft, nontender, nondistended. Bowel sounds present. No organomegaly or mass.  EXTREMITIES: No pedal edema, cyanosis, or clubbing.  NEUROLOGIC:  Unable to asses  neurological exam because of her confusion  PSYCHIATRIC:  Awake but very slow  To respond.  LABORATORY PANEL:   CBC  Recent Labs Lab 08/26/16 0531  WBC 3.4*  HGB 9.7*  HCT 30.1*  PLT 152   ------------------------------------------------------------------------------------------------------------------  Chemistries   Recent Labs Lab 08/25/16 1432 08/26/16 0531  NA 137 133*  K 3.3* 3.6  CL 100* 98*  CO2 26 22  GLUCOSE 96  143*  BUN 30* 40*  CREATININE 4.81* 5.53*  CALCIUM 9.2 8.9  AST 93*  --   ALT 22  --   ALKPHOS 141*  --   BILITOT 0.7  --    ------------------------------------------------------------------------------------------------------------------  Cardiac Enzymes  Recent Labs Lab 08/25/16 1432  TROPONINI 0.24*   ------------------------------------------------------------------------------------------------------------------  RADIOLOGY:  Ct Angio Chest Pe W/cm &/or Wo Cm  Result Date: 08/25/2016 CLINICAL DATA:  Hypoxia and hypotension. EXAM: CT ANGIOGRAPHY CHEST WITH CONTRAST TECHNIQUE: Multidetector CT imaging of the chest was performed using the standard protocol during bolus administration of intravenous contrast. Multiplanar CT image reconstructions and MIPs were obtained to evaluate the vascular anatomy. CONTRAST:  75 cc Isovue 370 COMPARISON:  Chest CT 05/16/2016 FINDINGS: Chest wall: Artifact from a a left-sided permanent pacemaker. Status post left mastectomy. No definite right-sided breast masses. No supraclavicular or axillary adenopathy. Cardiovascular: The heart is enlarged but stable. No pericardial effusion. Stable tortuosity, ectasia and calcification of the thoracic aorta without obvious dissection or focal aneurysm. Stable dense 3 vessel coronary artery calcifications. Extensive reflux of contrast down the IVC and into the hepatic veins consistent with right heart failure or tricuspid regurgitation. Enlarged pulmonary arteries consistent with pulmonary hypertension. No filling defects to suggest pulmonary embolism. Mediastinum/Nodes: Suspect subcarinal and right hilar adenopathy. The esophagus is grossly normal. Lungs/Pleura: Enlarging bilateral lung lesions consistent with metastatic disease. Underlying emphysema and interstitial lung disease. Upper Abdomen: Advanced vascular disease. Left adrenal gland metastasis. Musculoskeletal: Lytic metastatic bone disease with pathologic  compression fractures and a pathologic sternal fracture. Rib lesions are also noted. Progressive finding  since prior study. Review of the MIP images confirms the above findings. IMPRESSION: 1. No CT findings for pulmonary embolism. 2. Stable massive cardiac enlargement. 3. Progressive pulmonary metastatic disease with enlarging pulmonary nodules. 4. Stable underlying emphysema and interstitial lung disease. 5. Progressive lytic metastatic bone disease with pathologic thoracic spine compression fractures and a lower sternal fracture. Electronically Signed   By: Marijo Sanes M.D.   On: 08/25/2016 21:01   Dg Chest Port 1 View  Result Date: 08/25/2016 CLINICAL DATA:  Tightness of lung cancer on Thursday. Shortness of breath, hypoxic EXAM: PORTABLE CHEST 1 VIEW COMPARISON:  PET-CT 08/18/2016 FINDINGS: There is bilateral chronic interstitial thickening. Left apical pulmonary nodule again noted similar in appearance to the prior exam. Other bilateral pulmonary nodules seen on a recent PET-CT are not well delineated on the current exam. There is no focal consolidation. There is no pleural effusion or pneumothorax. There is stable cardiomegaly. There is a dual lead cardiac pacemaker. The osseous structures are unremarkable. IMPRESSION: 1. No acute cardiopulmonary disease. 2. Cardiomegaly. Electronically Signed   By: Kathreen Devoid   On: 08/25/2016 14:14    EKG:   Orders placed or performed during the hospital encounter of 08/25/16  . ED EKG  . ED EKG    ASSESSMENT AND PLAN:   Acute respiratory failure with hypoxia, acute on chronic diastolic heart failure: Patient is a 1 dose IV Lasix, nephrology consulted for dialysis. BNP 808 on admission, elevated troponin at 0.24.continue 02. O2 sats 89% on room air when she came but now she 100% on 2 L. 2. Hypotension;: Improved and blood pressure. #3 bradycardia: Rate improved rate was 59 yesterday, now it is  In 78s. Continue to hold Coreg, clonidine today.  #4.  History of right-sided breast cancer with bone metastases, liver metastases as per oncology note from March 2: Palliative care consulted to discuss the goals and decide about her DO NOT RESUSCITATE, hospice referral. History of macrocytic anemia #5 stage II sacral decubiti, stage I left heel ulcer: Seen by wound care, continue wound care as ordered.. #6 anorexia due to cancer: #7 metabolic encephalopathy secondary to all the above: #8 history of PAD, renal cell carcinoma of the right kidney, atrial fibrillation, coronary artery disease: Patient is on aspirin, Plavix, Imdur, statins,   Coreg on  hold because of bradycardia.   Prognosis really poor,will d/w family when they arrive.  All the records are reviewed and case discussed with Care Management/Social Workerr. Management plans discussed with the patient, family and they are in agreement.  CODE STATUS: full  TOTAL TIME TAKING CARE OF THIS PATIENT: 40 minutes.   POSSIBLE D/C IN 1-2DAYS, DEPENDING ON CLINICAL CONDITION.   Epifanio Lesches M.D on 08/26/2016 at 11:26 AM  Between 7am to 6pm - Pager - [phone]  After 6pm go to [email or web address] - password [email or web address] [facility]  Office  [phone]  CC: Primary care physician; Madelyn Brunner, MD   Note: This dictation was prepared with Dragon dictation along with smaller phrase technology. Any transcriptional errors that result from this process are unintentional.

## 2016-08-26 NOTE — Care Management Obs Status (Signed)
[place] NOTIFICATION   Patient Details  Name: Patricia Woodard MRN: [id number] Date of Birth: May 17, 1932   Medicare Observation Status Notification Given:  Yes    Shelbie Ammons, RN 08/26/2016, 9:07 AM

## 2016-08-26 NOTE — Progress Notes (Signed)
Palliative Medicine Team  Due to high volume of referrals, there is a delay seeing this patient. PMT not at [facility] over the weekend but will arrange goals of care with patient and family next week. Thank you for the opportunity to participate in the care of Patricia Woodard.  Ihor Dow, FNP-C Palliative Medicine Team  Phone: [phone] Fax: [phone]

## 2016-08-26 NOTE — Care Management (Signed)
Admitted to [facility] with the diagnosis of acute respiratory failure. Daughters live with her.  Cassandra [phone]). Last seen Dr. Gilford Rile in November. Fell in November and broke her hip. Just released from [facility] 08/17/16. Followed by [facility] in the home.[facility] 3-4 years ago.  Oxygen was prn in the facility. No oxygen in the home. Established dialysis patient at [facility] on [address] [employer]. Self feeds. Puts on shirt, but needs help with other clothes and baths. Rolling Walker, bedside commode, and tub chair in the home. No falls since November 2017. Appetite up and down. Gained 9 pounds. Shelbie Ammons RN MSN CCM Care Management

## 2016-08-26 NOTE — Progress Notes (Signed)
HD STARTED  

## 2016-08-26 NOTE — Consult Note (Signed)
[place] Nurse wound consult note Reason for Consult:NOnhealing chronic neuropathic ulcers to bilateral heels.  Stage 2 pressure injury to sacrum, present on admission.  Wound type:Chronic nonhealing to heels Stage 2 pressure to sacrum Pressure Injury POA: Yes Measurement:Left heel 1 cm x 1 cm x 0.2 cm stage 3 pressure injury callous present circumferentially Right heel 4 cm x 3.4 cm eschar  Dry and intact Sacrum 2 cm x 2 cm x 0.1 cm pink and moist wound bed Wound bed:see above Drainage (amount, consistency, odor) scant serous Right heel is dryand intact Periwound:dry skin callous Dressing procedure/placement/frequency:Cleanse wounds to sacrum, bilateral heels with soap and water.  Apply silicone border foam dressings and change every 3 days and PRN soilage.  Will not follow at this time.  Please re-consult if needed.  Domenic Moras RN BSN [place] Pager [phone]

## 2016-08-26 NOTE — Progress Notes (Signed)
Post hemodialsysis: Tolerated 3.5hours of treatment with 0.5liters net removal.Hemastasis achieved,sites dressed with gauze/taped.

## 2016-08-26 NOTE — Progress Notes (Signed)
PRE DIALYSIS ASSESSMENT 

## 2016-08-26 NOTE — Progress Notes (Signed)
Subjective:   Patient known to the practice from outpatient dialysis. She presented for evaluation of hypoxia noted by home health nurse. Patient does not remember how she got here. Chest x-ray showed cardiomegaly and some fluid retention in interlobar fissures concerning for CHF.  She was admitted for further evaluation and management.  Objective:  Vital signs in last 24 hours:  Temp:  [97.4 F (36.3 C)-99 F (37.2 C)] 97.5 F (36.4 C) (03/09 1310) Pulse Rate:  [49-61] 59 (03/09 1310) Resp:  [16-20] 18 (03/09 0737) BP: (92-132)/(33-63) 92/41 (03/09 1310) SpO2:  [89 %-100 %] 100 % (03/09 1310) Weight:  [57.4 kg (126 lb 9.6 oz)] 57.4 kg (126 lb 9.6 oz) (03/08 1748)  Weight change:  Filed Weights   08/25/16 1315 08/25/16 1748  Weight: 56.7 kg (125 lb) 57.4 kg (126 lb 9.6 oz)    Intake/Output:    Intake/Output Summary (Last 24 hours) at 08/26/16 1426 Last data filed at 08/26/16 1343  Gross per 24 hour  Intake              480 ml  Output                0 ml  Net              480 ml     Physical Exam: General: No acute distress, laying in the bed  HEENT anicteric  Neck supple  Pulm/lungs Mild coarse crackles bilateral  CVS/Heart Irregular, no rub, systolic murmur present  Abdomen:  Soft, nontender, nondistended  Extremities: Both feet and soft boot support. No significant edema  Neurologic: Alert, able to answer a few questions.  Oriented to self  Skin: No acute rashes          Basic Metabolic Panel:   Recent Labs Lab 08/25/16 1432 08/26/16 0531  NA 137 133*  K 3.3* 3.6  CL 100* 98*  CO2 26 22  GLUCOSE 96 143*  BUN 30* 40*  CREATININE 4.81* 5.53*  CALCIUM 9.2 8.9     CBC:  Recent Labs Lab 08/25/16 1432 08/26/16 0531  WBC 4.4 3.4*  NEUTROABS 2.2  --   HGB 9.8* 9.7*  HCT 30.3* 30.1*  MCV 102.6* 101.8*  PLT 147* 152      Microbiology:  No results found for this or any previous visit (from the past 720 hour(s)).  Coagulation  Studies:  Recent Labs  08/25/16 1432  LABPROT 15.1  INR 1.18    Urinalysis:  Recent Labs  08/25/16 1432  COLORURINE AMBER*  LABSPEC 1.012  PHURINE 6.0  GLUCOSEU NEGATIVE  HGBUR MODERATE*  BILIRUBINUR NEGATIVE  KETONESUR NEGATIVE  PROTEINUR 100*  NITRITE NEGATIVE  LEUKOCYTESUR LARGE*      Imaging: Ct Angio Chest Pe W/cm &/or Wo Cm  Result Date: 08/25/2016 CLINICAL DATA:  Hypoxia and hypotension. EXAM: CT ANGIOGRAPHY CHEST WITH CONTRAST TECHNIQUE: Multidetector CT imaging of the chest was performed using the standard protocol during bolus administration of intravenous contrast. Multiplanar CT image reconstructions and MIPs were obtained to evaluate the vascular anatomy. CONTRAST:  75 cc Isovue 370 COMPARISON:  Chest CT 05/16/2016 FINDINGS: Chest wall: Artifact from a a left-sided permanent pacemaker. Status post left mastectomy. No definite right-sided breast masses. No supraclavicular or axillary adenopathy. Cardiovascular: The heart is enlarged but stable. No pericardial effusion. Stable tortuosity, ectasia and calcification of the thoracic aorta without obvious dissection or focal aneurysm. Stable dense 3 vessel coronary artery calcifications. Extensive reflux of contrast down the IVC and into  the hepatic veins consistent with right heart failure or tricuspid regurgitation. Enlarged pulmonary arteries consistent with pulmonary hypertension. No filling defects to suggest pulmonary embolism. Mediastinum/Nodes: Suspect subcarinal and right hilar adenopathy. The esophagus is grossly normal. Lungs/Pleura: Enlarging bilateral lung lesions consistent with metastatic disease. Underlying emphysema and interstitial lung disease. Upper Abdomen: Advanced vascular disease. Left adrenal gland metastasis. Musculoskeletal: Lytic metastatic bone disease with pathologic compression fractures and a pathologic sternal fracture. Rib lesions are also noted. Progressive finding since prior study. Review of  the MIP images confirms the above findings. IMPRESSION: 1. No CT findings for pulmonary embolism. 2. Stable massive cardiac enlargement. 3. Progressive pulmonary metastatic disease with enlarging pulmonary nodules. 4. Stable underlying emphysema and interstitial lung disease. 5. Progressive lytic metastatic bone disease with pathologic thoracic spine compression fractures and a lower sternal fracture. Electronically Signed   By: Marijo Sanes M.D.   On: 08/25/2016 21:01   Dg Chest Port 1 View  Result Date: 08/25/2016 CLINICAL DATA:  Tightness of lung cancer on Thursday. Shortness of breath, hypoxic EXAM: PORTABLE CHEST 1 VIEW COMPARISON:  PET-CT 08/18/2016 FINDINGS: There is bilateral chronic interstitial thickening. Left apical pulmonary nodule again noted similar in appearance to the prior exam. Other bilateral pulmonary nodules seen on a recent PET-CT are not well delineated on the current exam. There is no focal consolidation. There is no pleural effusion or pneumothorax. There is stable cardiomegaly. There is a dual lead cardiac pacemaker. The osseous structures are unremarkable. IMPRESSION: 1. No acute cardiopulmonary disease. 2. Cardiomegaly. Electronically Signed   By: Kathreen Devoid   On: 08/25/2016 14:14     Medications:    . [START ON 08/27/2016] amiodarone  100 mg Oral Daily  . amLODipine  10 mg Oral Daily  . anastrozole  1 mg Oral Daily  . aspirin EC  81 mg Oral Daily  . cinacalcet  30 mg Oral Q supper  . clopidogrel  75 mg Oral Daily  . ferrous sulfate  325 mg Oral Q breakfast  . furosemide  80 mg Oral Daily  . gabapentin  100 mg Oral TID  . heparin  5,000 Units Subcutaneous Q8H  . irbesartan  300 mg Oral Daily  . isosorbide mononitrate  30 mg Oral Daily  . levothyroxine  100 mcg Oral QAC breakfast  . mirtazapine  15 mg Oral QHS  . multivitamin  1 tablet Oral Daily  . pantoprazole  40 mg Oral Daily  . pravastatin  40 mg Oral q1800  . senna  1 tablet Oral BID  . sevelamer  carbonate  800 mg Oral TID WC  . sodium chloride flush  3 mL Intravenous Q12H  . sodium chloride flush  3 mL Intravenous Q12H   sodium chloride, acetaminophen **OR** acetaminophen, bisacodyl, diphenhydrAMINE, ondansetron **OR** ondansetron (ZOFRAN) IV, sodium chloride flush  Assessment/ Plan:  83 y.o. female with hypertension, MI requiring hypothermia treatment, congestive heart failure with EF , dilated cardiomyopathy, ESRD, hyperlipidemia, peripheral vascular disease, renal cell carcinoma s/p radiation therapy (RCC followed at [facility]), rt carotid stent 08/2012, Left hip fracture repair (june 2014), epo ok'd by [facility], h/o breast cancer. Rt hip fracture s/p ORIF 05/14/2016 No [facility] dialysis/ MWF-2. Patricia Woodard  1.  ESRD - We will arrange for hemodialysis while in the hospital 2. AOCKD No EPO due to metastatic cancer 3. Metastatic breast cancer - recently evaluated bt Dr Janese Banks at cancer center PET CT and CT chest with iv contrast today shows metastatic disease to her bones, liver  Palliative consult pending 4. Hypertension - d/c duplicate ARB therapy - monitor  5. Hypoxia Unclear cause but likely volume related  Outpatient EDW 57.5- In hospital 57.4 kg UF 0.5 kg with HD as see   LOS: 0 Patricia Woodard 3/9/20182:26 PM

## 2016-08-26 NOTE — Progress Notes (Signed)
Post hd tx 

## 2016-08-27 LAB — EXPECTORATED SPUTUM ASSESSMENT W GRAM STAIN, RFLX TO RESP C

## 2016-08-27 MED ORDER — LEVOFLOXACIN 500 MG PO TABS
250.0000 mg | ORAL_TABLET | ORAL | Status: DC
  Administered 2016-08-27 – 2016-08-29 (×2): 250 mg via ORAL
  Filled 2016-08-27 (×2): qty 1

## 2016-08-27 NOTE — Progress Notes (Signed)
Two different specimens of sputum collected and sent to lab with neither adequate specimen. Specimen cup placed at bedside with pt and family education done.

## 2016-08-27 NOTE — Clinical Social Work Note (Signed)
CSW received consult for discharge needs. According to chart notes as of 08/27/16, the patient's family plans to take her home. CSW is signing off; please feel free to consult should any facility needs or APS needs arise.  Santiago Bumpers, MSW, Latanya Presser [phone]

## 2016-08-27 NOTE — Progress Notes (Signed)
Subjective:   Doing fair. Able to eat good.  Daughter and Husband are at bedside No c/o acute SOB 500 cc removed with HD yesterday  Objective:  Vital signs in last 24 hours:  Temp:  [97.5 F (36.4 C)-98.7 F (37.1 C)] 98.7 F (37.1 C) (03/10 0514) Pulse Rate:  [59-62] 59 (03/10 0733) Resp:  [11-20] 18 (03/10 0733) BP: (92-115)/(41-64) 105/41 (03/10 0733) SpO2:  [97 %-100 %] 98 % (03/10 0733)  Weight change:  Filed Weights   08/25/16 1315 08/25/16 1748  Weight: 56.7 kg (125 lb) 57.4 kg (126 lb 9.6 oz)    Intake/Output:    Intake/Output Summary (Last 24 hours) at 08/27/16 1126 Last data filed at 08/26/16 1842  Gross per 24 hour  Intake              240 ml  Output              500 ml  Net             -260 ml     Physical Exam: General: No acute distress, laying in the bed  HEENT anicteric  Neck supple  Pulm/lungs [place] O2, normal breathing effort  CVS/Heart Irregular, no rub, systolic murmur present  Abdomen:  Soft, nontender, nondistended  Extremities: Both feet and soft boot support. No significant edema  Neurologic: Alert, oriented to person and space  Skin: No acute rashes          Basic Metabolic Panel:   Recent Labs Lab 08/25/16 1432 08/26/16 0531  NA 137 133*  K 3.3* 3.6  CL 100* 98*  CO2 26 22  GLUCOSE 96 143*  BUN 30* 40*  CREATININE 4.81* 5.53*  CALCIUM 9.2 8.9     CBC:  Recent Labs Lab 08/25/16 1432 08/26/16 0531  WBC 4.4 3.4*  NEUTROABS 2.2  --   HGB 9.8* 9.7*  HCT 30.3* 30.1*  MCV 102.6* 101.8*  PLT 147* 152      Microbiology:  No results found for this or any previous visit (from the past 720 hour(s)).  Coagulation Studies:  Recent Labs  08/25/16 1432  LABPROT 15.1  INR 1.18    Urinalysis:  Recent Labs  08/25/16 1432  COLORURINE AMBER*  LABSPEC 1.012  PHURINE 6.0  GLUCOSEU NEGATIVE  HGBUR MODERATE*  BILIRUBINUR NEGATIVE  KETONESUR NEGATIVE  PROTEINUR 100*  NITRITE NEGATIVE  LEUKOCYTESUR LARGE*       Imaging: Ct Angio Chest Pe W/cm &/or Wo Cm  Result Date: 08/25/2016 CLINICAL DATA:  Hypoxia and hypotension. EXAM: CT ANGIOGRAPHY CHEST WITH CONTRAST TECHNIQUE: Multidetector CT imaging of the chest was performed using the standard protocol during bolus administration of intravenous contrast. Multiplanar CT image reconstructions and MIPs were obtained to evaluate the vascular anatomy. CONTRAST:  75 cc Isovue 370 COMPARISON:  Chest CT 05/16/2016 FINDINGS: Chest wall: Artifact from a a left-sided permanent pacemaker. Status post left mastectomy. No definite right-sided breast masses. No supraclavicular or axillary adenopathy. Cardiovascular: The heart is enlarged but stable. No pericardial effusion. Stable tortuosity, ectasia and calcification of the thoracic aorta without obvious dissection or focal aneurysm. Stable dense 3 vessel coronary artery calcifications. Extensive reflux of contrast down the IVC and into the hepatic veins consistent with right heart failure or tricuspid regurgitation. Enlarged pulmonary arteries consistent with pulmonary hypertension. No filling defects to suggest pulmonary embolism. Mediastinum/Nodes: Suspect subcarinal and right hilar adenopathy. The esophagus is grossly normal. Lungs/Pleura: Enlarging bilateral lung lesions consistent with metastatic disease. Underlying emphysema and interstitial  lung disease. Upper Abdomen: Advanced vascular disease. Left adrenal gland metastasis. Musculoskeletal: Lytic metastatic bone disease with pathologic compression fractures and a pathologic sternal fracture. Rib lesions are also noted. Progressive finding since prior study. Review of the MIP images confirms the above findings. IMPRESSION: 1. No CT findings for pulmonary embolism. 2. Stable massive cardiac enlargement. 3. Progressive pulmonary metastatic disease with enlarging pulmonary nodules. 4. Stable underlying emphysema and interstitial lung disease. 5. Progressive lytic metastatic  bone disease with pathologic thoracic spine compression fractures and a lower sternal fracture. Electronically Signed   By: Marijo Sanes M.D.   On: 08/25/2016 21:01   Dg Chest Port 1 View  Result Date: 08/25/2016 CLINICAL DATA:  Tightness of lung cancer on Thursday. Shortness of breath, hypoxic EXAM: PORTABLE CHEST 1 VIEW COMPARISON:  PET-CT 08/18/2016 FINDINGS: There is bilateral chronic interstitial thickening. Left apical pulmonary nodule again noted similar in appearance to the prior exam. Other bilateral pulmonary nodules seen on a recent PET-CT are not well delineated on the current exam. There is no focal consolidation. There is no pleural effusion or pneumothorax. There is stable cardiomegaly. There is a dual lead cardiac pacemaker. The osseous structures are unremarkable. IMPRESSION: 1. No acute cardiopulmonary disease. 2. Cardiomegaly. Electronically Signed   By: Kathreen Devoid   On: 08/25/2016 14:14     Medications:    . amiodarone  100 mg Oral Daily  . anastrozole  1 mg Oral Daily  . aspirin EC  81 mg Oral Daily  . cinacalcet  30 mg Oral Q supper  . clopidogrel  75 mg Oral Daily  . ferrous sulfate  325 mg Oral Q breakfast  . furosemide  80 mg Oral Daily  . gabapentin  100 mg Oral TID  . heparin  5,000 Units Subcutaneous Q8H  . irbesartan  300 mg Oral Daily  . isosorbide mononitrate  30 mg Oral Daily  . levofloxacin  250 mg Oral Q48H  . levothyroxine  100 mcg Oral QAC breakfast  . mirtazapine  15 mg Oral QHS  . multivitamin  1 tablet Oral Daily  . pantoprazole  40 mg Oral Daily  . pravastatin  40 mg Oral q1800  . senna  1 tablet Oral BID  . sevelamer carbonate  800 mg Oral TID WC  . sodium chloride flush  3 mL Intravenous Q12H  . sodium chloride flush  3 mL Intravenous Q12H   sodium chloride, acetaminophen **OR** acetaminophen, bisacodyl, diphenhydrAMINE, ondansetron **OR** ondansetron (ZOFRAN) IV, sodium chloride flush  Assessment/ Plan:  83 y.o. female with  hypertension, MI requiring hypothermia treatment, congestive heart failure with EF , dilated cardiomyopathy, ESRD, hyperlipidemia, peripheral vascular disease, renal cell carcinoma s/p radiation therapy (RCC followed at [facility]), rt carotid stent 08/2012, Left hip fracture repair (june 2014), epo ok'd by [facility], h/o breast cancer. Rt hip fracture s/p ORIF 05/14/2016 No [facility]/ MWF-2. Patricia Woodard  1.  ESRD - We will arrange for hemodialysis while in the hospital - Next HD on Monday  2. AOCKD No EPO due to metastatic cancer  3. Metastatic breast cancer - recently evaluated bt Dr Janese Banks at cancer center PET CT and CT chest with iv contrast this admission shows metastatic disease to her bones, liver Palliative consult pending  4. Hypertension - d/c duplicate ARB therapy - monitor   5. Hypoxia Improved  500 cc removed with HD yesterday   LOS: 0 Clevie Prout 3/10/201811:26 AM

## 2016-08-27 NOTE — Progress Notes (Signed)
[place] [facility] [place] NAME: Patricia Woodard    MR#:  [id number]  DATE OF BIRTH:  11-Mar-1932  SUBJECTIVE; seen at bedside, admitted for shortness of breath, confusion. Patient is slightly alert than yesterday, has mild shortness of breath. Appears very cachectic.   CHIEF COMPLAINT:   Chief Complaint  Patient presents with  . Shortness of Breath   denies any complains, answers simple questions with few words.  REVIEW OF SYSTEMS:   Review of Systems  Unable to perform ROS: Acuity of condition   Appears confused.  DRUG ALLERGIES:   Allergies  Allergen Reactions  . Penicillins Itching  . Ivp Dye [Iodinated Diagnostic Agents] Rash  . Shellfish Allergy Rash    VITALS:  Blood pressure (!) 105/41, pulse (!) 59, temperature 98.7 F (37.1 C), temperature source Oral, resp. rate 18, height 5\' 7"  (1.702 m), weight 57.4 kg (126 lb 9.6 oz), SpO2 98 %.  PHYSICAL EXAMINATION:  GENERAL:  83 y.o.-year-old patient lying in the bed w,cachectic.   EYES: Pupils equal, round, reactive to light .no scleral icterus. Marland Kitchen  HEENT: Head atraumatic, normocephalic. Oropharynx and nasopharynx clear.  NECK:  Supple, no jugular venous distention. No thyroid enlargement, no tenderness.  LUNGS: decreased  breath sounds bilaterally. Crackles in the left base,  CARDIOVASCULAR: S1, S2 normal. No murmurs, rubs, or gallops.  ABDOMEN: Soft, nontender, nondistended. Bowel sounds present. No organomegaly or mass.  EXTREMITIES: No pedal edema, cyanosis, or clubbing.  NEUROLOGIC: pt is alert, angage in simple communications, and follows commands, moves all limbs slowly, generalized weak. PSYCHIATRIC:  Awake and oriented.  LABORATORY PANEL:   CBC  Recent Labs Lab 08/26/16 0531  WBC 3.4*  HGB 9.7*  HCT 30.1*  PLT 152   ------------------------------------------------------------------------------------------------------------------  Chemistries   Recent  Labs Lab 08/25/16 1432 08/26/16 0531  NA 137 133*  K 3.3* 3.6  CL 100* 98*  CO2 26 22  GLUCOSE 96 143*  BUN 30* 40*  CREATININE 4.81* 5.53*  CALCIUM 9.2 8.9  AST 93*  --   ALT 22  --   ALKPHOS 141*  --   BILITOT 0.7  --    ------------------------------------------------------------------------------------------------------------------  Cardiac Enzymes  Recent Labs Lab 08/25/16 1432  TROPONINI 0.24*   ------------------------------------------------------------------------------------------------------------------  RADIOLOGY:  Ct Angio Chest Pe W/cm &/or Wo Cm  Result Date: 08/25/2016 CLINICAL DATA:  Hypoxia and hypotension. EXAM: CT ANGIOGRAPHY CHEST WITH CONTRAST TECHNIQUE: Multidetector CT imaging of the chest was performed using the standard protocol during bolus administration of intravenous contrast. Multiplanar CT image reconstructions and MIPs were obtained to evaluate the vascular anatomy. CONTRAST:  75 cc Isovue 370 COMPARISON:  Chest CT 05/16/2016 FINDINGS: Chest wall: Artifact from a a left-sided permanent pacemaker. Status post left mastectomy. No definite right-sided breast masses. No supraclavicular or axillary adenopathy. Cardiovascular: The heart is enlarged but stable. No pericardial effusion. Stable tortuosity, ectasia and calcification of the thoracic aorta without obvious dissection or focal aneurysm. Stable dense 3 vessel coronary artery calcifications. Extensive reflux of contrast down the IVC and into the hepatic veins consistent with right heart failure or tricuspid regurgitation. Enlarged pulmonary arteries consistent with pulmonary hypertension. No filling defects to suggest pulmonary embolism. Mediastinum/Nodes: Suspect subcarinal and right hilar adenopathy. The esophagus is grossly normal. Lungs/Pleura: Enlarging bilateral lung lesions consistent with metastatic disease. Underlying emphysema and interstitial lung disease. Upper Abdomen: Advanced vascular  disease. Left adrenal gland metastasis. Musculoskeletal: Lytic metastatic bone disease with pathologic compression fractures and a  pathologic sternal fracture. Rib lesions are also noted. Progressive finding since prior study. Review of the MIP images confirms the above findings. IMPRESSION: 1. No CT findings for pulmonary embolism. 2. Stable massive cardiac enlargement. 3. Progressive pulmonary metastatic disease with enlarging pulmonary nodules. 4. Stable underlying emphysema and interstitial lung disease. 5. Progressive lytic metastatic bone disease with pathologic thoracic spine compression fractures and a lower sternal fracture. Electronically Signed   By: Marijo Sanes M.D.   On: 08/25/2016 21:01   Dg Chest Port 1 View  Result Date: 08/25/2016 CLINICAL DATA:  Tightness of lung cancer on Thursday. Shortness of breath, hypoxic EXAM: PORTABLE CHEST 1 VIEW COMPARISON:  PET-CT 08/18/2016 FINDINGS: There is bilateral chronic interstitial thickening. Left apical pulmonary nodule again noted similar in appearance to the prior exam. Other bilateral pulmonary nodules seen on a recent PET-CT are not well delineated on the current exam. There is no focal consolidation. There is no pleural effusion or pneumothorax. There is stable cardiomegaly. There is a dual lead cardiac pacemaker. The osseous structures are unremarkable. IMPRESSION: 1. No acute cardiopulmonary disease. 2. Cardiomegaly. Electronically Signed   By: Kathreen Devoid   On: 08/25/2016 14:14    EKG:   Orders placed or performed during the hospital encounter of 08/25/16  . ED EKG  . ED EKG    ASSESSMENT AND PLAN:   #1. Acute respiratory failure with hypoxia, acute on chronic diastolic heart failure: Patient is a 1 dose IV Lasix, nephrology consulted for dialysis. BNP 808 on admission, elevated troponin at 0.24.continue 02. O2 sats 89% on room air when she came but now she 100% on 2 L.  she may need home O2.   HD done per nephro. # 2.  Hypotension;: Improved , stopped amlodipine due to low BP. #3 bradycardia: Rate improved rate was 59 yesterday, now it is  In 53s. Continue to hold Coreg, clonidine . #4. History of right-sided breast cancer with bone metastases, liver metastases as per oncology note from March 2: Palliative care consulted to discuss the goals and decide about her DO NOT RESUSCITATE, hospice referral. History of macrocytic anemia #5 stage II sacral decubiti, stage I left heel ulcer: Seen by wound care, continue wound care as ordered.. #6 anorexia due to cancer: #7 metabolic encephalopathy secondary to all the above: Improved. #8 history of PAD, renal cell carcinoma of the right kidney, atrial fibrillation, coronary artery disease: Patient is on aspirin, Plavix, Imdur, statins,  Coreg on  hold because of bradycardia. #9. UTI- levaquin  I discussed with pt's daughter, she was referred to palliative care by Oncologist, and have a meeting with nurse at home on Monday , to help arrange hospice services. She may need Home o2. Daughter is a CNA and she, with help of other daughter- taking care or pt at home, have all equipments except hospital bed , which they are planning to get help from hospice. They are helping mother with walker and wheelchair. She feels, they will be able to take her home tomorrow, if pt is stable.   Prognosis really poor,will d/w family when they arrive.  All the records are reviewed and case discussed with Care Management/Social Workerr. Management plans discussed with the patient, family and they are in agreement.  CODE STATUS: DNR  TOTAL TIME TAKING CARE OF THIS PATIENT: 40 minutes.   POSSIBLE D/C IN 1-2DAYS, DEPENDING ON CLINICAL CONDITION.   Vaughan Basta M.D on 08/27/2016 at 8:37 AM  Between 7am to 6pm - Pager - [phone]  After 6pm go to [email or web address] - password [email or web address] [facility] [place] [facility]  Office  [phone]  CC: Primary care physician; Madelyn Brunner, MD   Note: This dictation was prepared with Dragon dictation along with smaller phrase technology. Any transcriptional errors that result from this process are unintentional.

## 2016-08-27 NOTE — Progress Notes (Signed)
Pt alert/pleasant. Tylenol given for leg pain;generalized soreness upon movement effectve. Levaquin po started for UTI. Bilateral suspension boots applied to feet for heel protection; pt turned every 1-2 hours. Pt feed self some of bkg/ family assisting with lunch. Dgt, Larey Seat, was requested by MD to come and see pt this afternoon to see how they feel about being able to care for pt at home with possible discharge tomorrow with palliative care consult planned for Monday. 02 desated to 82 % on RA with pulse ox 94% on 1.5l/[place]. Sputum collected this p.m and sent to lab for analysis. Family in and out of room; interacting with pt; assists with some care.

## 2016-08-27 NOTE — Progress Notes (Signed)
Pharmacy Antibiotic Note  Patricia Woodard is a 83 y.o. female admitted on 08/25/2016 with surgical prophylaxis.  Pharmacy has been consulted for levofloxacin dosing.  Patient has ESRD on HD.  Plan: Levofloxacin 250 mg PO q48h  Height: 5\' 7"  (170.2 cm) Weight: 126 lb 9.6 oz (57.4 kg) IBW/kg (Calculated) : 61.6  Temp (24hrs), Avg:98.1 F (36.7 C), Min:97.5 F (36.4 C), Max:98.7 F (37.1 C)   Recent Labs Lab 08/25/16 1432 08/26/16 0531  WBC 4.4 3.4*  CREATININE 4.81* 5.53*    Estimated Creatinine Clearance: 7 mL/min (by C-G formula based on SCr of 5.53 mg/dL (H)).    Allergies  Allergen Reactions  . Penicillins Itching  . Ivp Dye [Iodinated Diagnostic Agents] Rash  . Shellfish Allergy Rash    Thank you for allowing pharmacy to be a part of this patient's care.  Darylene Price [facility] 08/27/2016 8:53 AM

## 2016-08-27 NOTE — Progress Notes (Signed)
I had a telephone conversation with patient's daughter who is the healthcare power of attorney, after discussing with all other significant family members she has decided to change her mom's CODE STATUS from full code to DO NOT RESUSCITATE. All family members are anatomically agreeable as reported by the daughter. RN Opal Sidles was present during the conversation

## 2016-08-28 MED ORDER — OXYCODONE-ACETAMINOPHEN 5-325 MG PO TABS
1.0000 | ORAL_TABLET | ORAL | Status: DC | PRN
  Administered 2016-08-28: 1 via ORAL
  Filled 2016-08-28: qty 1

## 2016-08-28 MED ORDER — HYDROCODONE-ACETAMINOPHEN 5-325 MG PO TABS
1.0000 | ORAL_TABLET | ORAL | Status: DC | PRN
  Administered 2016-08-28 (×2): 1 via ORAL
  Filled 2016-08-28 (×2): qty 1

## 2016-08-28 MED ORDER — OXYCODONE HCL ER 10 MG PO T12A: 10.0000 mg | EXTENDED_RELEASE_TABLET | Freq: Two times a day (BID) | ORAL | Status: DC

## 2016-08-28 MED ORDER — HYDROCODONE-ACETAMINOPHEN 5-325 MG PO TABS
1.0000 | ORAL_TABLET | ORAL | Status: DC
  Administered 2016-08-28 – 2016-09-01 (×17): 1 via ORAL
  Filled 2016-08-28 (×18): qty 1

## 2016-08-28 NOTE — Progress Notes (Signed)
Pt had severe back pain while trying to sit up as a preparation for HD, she could not go for it. So we can not discharge today. Let the family decide the further goals of care and will let palliative care nurse meet in hospital tomorrow with them to help decide the plan.

## 2016-08-28 NOTE — Progress Notes (Signed)
SATURATION QUALIFICATIONS: (This note is used to comply with regulatory documentation for home oxygen)  Patient Saturations on Room Air at Rest =  82 %  Patient Saturations on Room Air while Ambulating = Pt is non-ambulatory %  Patient Saturations on 2 Liters of oxygen while Ambulating = non- ambulatory %  98% while at rest on 2L N/C.  Please briefly explain why patient needs home oxygen:  Lung cancer, breast cancer, CHF, ESRD, non ambulatory 02 sats provided by Westley Gambles RN on 08/27/16.

## 2016-08-28 NOTE — Care Management Note (Signed)
Case Management Note  Patient Details  Name: DENAYA HORN MRN: [id number] Date of Birth: May 30, 1932  Subjective/Objective:    Mrs Emmelyn Schmale has breast cancer and lung cancer and is non-ambulatory which requires her body to be positioned in ways not feasible in a normal bed. The head of her bed must be elevated 30 degrees or more or Ms Welchel has increased pain and difficulty breathing. She is currently on 2L N/C continuously.                 Action/Plan:   Expected Discharge Date:                  Expected Discharge Plan:     In-House Referral:     Discharge planning Services     Post Acute Care Choice:    Choice offered to:     DME Arranged:    DME Agency:     HH Arranged:    HH Agency:     Status of Service:     If discussed at [employer] of Stay Meetings, dates discussed:    Additional Comments:  Clemmie Marxen A, RN 08/28/2016, 11:51 AM

## 2016-08-28 NOTE — Progress Notes (Signed)
Pt remains at bedrest. Reported increase severe back pain when raising HOB up for med administration or other movements. Norco effective for pain. Pt does not stand/bedbound. Appetite decreased today. Slept/more comfortable this p.m after pain med. HD cancelled due to pain upon movement and pt unable to stand/tolerate transfers. Plan to meet with palliative care team here tomorrow. Family in and out with MD speaking with various family members in regards to pt condition/treatment options.

## 2016-08-28 NOTE — Progress Notes (Signed)
[place] at [place] NAME: Patricia Woodard    MR#:  [id number]  DATE OF BIRTH:  1931-10-08  SUBJECTIVE; seen at bedside, admitted for shortness of breath, confusion. Patient is slightly alert than yesterday, has mild shortness of breath. Appears very cachectic.   CHIEF COMPLAINT:   Chief Complaint  Patient presents with  . Shortness of Breath   denies any complains, answers simple questions with few words.  Could not move in bed to transfer for HD today , due to severe back pain.  REVIEW OF SYSTEMS:   Review of Systems  Unable to perform ROS: Acuity of condition   Appears confused.  DRUG ALLERGIES:   Allergies  Allergen Reactions  . Penicillins Itching  . Ivp Dye [Iodinated Diagnostic Agents] Rash  . Shellfish Allergy Rash    VITALS:  Blood pressure (!) 121/57, pulse 60, temperature 98.1 F (36.7 C), temperature source Oral, resp. rate 19, height 5\' 7"  (1.702 m), weight 59.9 kg (132 lb 0.9 oz), SpO2 98 %.  PHYSICAL EXAMINATION:  GENERAL:  83 y.o.-year-old patient lying in the bed w,cachectic.   EYES: Pupils equal, round, reactive to light .no scleral icterus. Marland Kitchen  HEENT: Head atraumatic, normocephalic. Oropharynx and nasopharynx clear.  NECK:  Supple, no jugular venous distention. No thyroid enlargement, no tenderness.  LUNGS: decreased  breath sounds bilaterally. Crackles in the left base,  CARDIOVASCULAR: S1, S2 normal. No murmurs, rubs, or gallops.  ABDOMEN: Soft, nontender, nondistended. Bowel sounds present. No organomegaly or mass.  EXTREMITIES: No pedal edema, cyanosis, or clubbing.  NEUROLOGIC: pt is alert, angage in simple communications, and follows commands, moves all limbs slowly, generalized weak. PSYCHIATRIC:  Awake and oriented.  LABORATORY PANEL:   CBC  Recent Labs Lab 08/26/16 0531  WBC 3.4*  HGB 9.7*  HCT 30.1*  PLT 152    ------------------------------------------------------------------------------------------------------------------  Chemistries   Recent Labs Lab 08/25/16 1432 08/26/16 0531  NA 137 133*  K 3.3* 3.6  CL 100* 98*  CO2 26 22  GLUCOSE 96 143*  BUN 30* 40*  CREATININE 4.81* 5.53*  CALCIUM 9.2 8.9  AST 93*  --   ALT 22  --   ALKPHOS 141*  --   BILITOT 0.7  --    ------------------------------------------------------------------------------------------------------------------  Cardiac Enzymes  Recent Labs Lab 08/25/16 1432  TROPONINI 0.24*   ------------------------------------------------------------------------------------------------------------------  RADIOLOGY:  No results found.  EKG:   Orders placed or performed during the hospital encounter of 08/25/16  . ED EKG  . ED EKG    ASSESSMENT AND PLAN:   #1. Acute respiratory failure with hypoxia, acute on chronic diastolic heart failure: Patient is a 1 dose IV Lasix, nephrology consulted for dialysis. BNP 808 on admission, elevated troponin at 0.24.continue 02. O2 sats 89% on room air when she came but now she 100% on 2 L.  she may need home O2.   HD done per nephro. # 2. Hypotension;: Improved , stopped amlodipine due to low BP. #3 bradycardia: Rate improved rate was 59 yesterday, now it is  In 74s. Continue to hold Coreg, clonidine . #4. History of right-sided breast cancer with bone metastases, liver metastases as per oncology note from March 2: Palliative care consulted to discuss the goals and decide about her DO NOT RESUSCITATE, hospice referral. History of macrocytic anemia #5 stage II sacral decubiti, stage I left heel ulcer: Seen by wound care, continue wound care as ordered.. #6 anorexia due to cancer: #7 metabolic encephalopathy  secondary to all the above: Improved. #8 history of PAD, renal cell carcinoma of the right kidney, atrial fibrillation, coronary artery disease: Patient is on aspirin, Plavix,  Imdur, statins,  Coreg on  hold because of bradycardia. #9. UTI- levaquin  I discussed with pt's daughter, she was referred to palliative care by Oncologist, and have a meeting with nurse at home on Monday , to help arrange hospice services. She may need Home o2. Daughter is a CNA and she, with help of other daughter- taking care or pt at home, have all equipments except hospital bed , which they are planning to get help from hospice. They are helping mother with walker and wheelchair. She feels, they will be able to take her home tomorrow, if pt is stable.  tried to see, if she can sit for HD, as a preparation of d/c plan, she could not tolerate even small movements due to severe back pain. I discussed with her daughters in room- this may be a hurdle in continuing her hd, THEY WILL WAIT TO SEE EFFECT OF MORE STRONG PAIN MEDS, if it helps - if not, they may have to consider stopping HD and go for hospice.   Prognosis really poor,will d/w family when they arrive.  All the records are reviewed and case discussed with Care Management/Social Workerr. Management plans discussed with the patient, family and they are in agreement.  CODE STATUS: DNR  TOTAL TIME TAKING CARE OF THIS PATIENT: 40 minutes.  POSSIBLE D/C IN 1-2 DAYS, DEPENDING ON CLINICAL CONDITION.  Vaughan Basta M.D on 08/28/2016 at 12:43 PM  Between 7am to 6pm - Pager - [phone]  After 6pm go to [email or web address] - password [email or web address]  Office  [phone]  CC: Primary care physician; Madelyn Brunner, MD   Note: This dictation was prepared with Dragon dictation along with smaller phrase technology. Any transcriptional errors that result from this process are unintentional.

## 2016-08-28 NOTE — Progress Notes (Addendum)
Plan was to dialyze patient today in chair prior to discharge. However, due to pain, patient is not even able to sit in the bed, let alone sit in a chair at bedside for dialysis. This will need to be explored further as patient will not be able to get outpatient dialysis unless she is able to sit through the treatment. Palliative care conference likely tomorrow. Dialysis for today is cancelled for now

## 2016-08-28 NOTE — Care Management Note (Signed)
Case Management Note  Patient Details  Name: Patricia Woodard MRN: [id number] Date of Birth: 12-13-1931  Subjective/Objective:     Dr Anselm Jungling called and cancelled his orders for a home hospital bed and new home oxygen for today per [name] is unable to sit up in bed for dialysis today. Family and Dr Anselm Jungling are discussing discharge options. Family has a meeting with Palliative care tomorrow on 08/29/16 per Dr Anselm Jungling.                Action/Plan:   Expected Discharge Date:                  Expected Discharge Plan:     In-House Referral:     Discharge planning Services     Post Acute Care Choice:    Choice offered to:     DME Arranged:    DME Agency:     HH Arranged:    HH Agency:     Status of Service:     If discussed at [employer] of Stay Meetings, dates discussed:    Additional Comments:  Jeffren Dombek A, RN 08/28/2016, 11:57 AM

## 2016-08-28 NOTE — Progress Notes (Signed)
Subjective:   Doing fair. Able to eat good.  Daughter and Husband are at bedside No c/o acute SOB Feels weak  Objective:  Vital signs in last 24 hours:  Temp:  [98.1 F (36.7 C)-98.8 F (37.1 C)] 98.1 F (36.7 C) (03/11 0447) Pulse Rate:  [59-60] 60 (03/11 0710) Resp:  [16-19] 19 (03/11 0447) BP: (94-121)/(51-57) 121/57 (03/11 0447) SpO2:  [82 %-100 %] 98 % (03/11 0710) Weight:  [59.9 kg (132 lb 0.9 oz)] 59.9 kg (132 lb 0.9 oz) (03/11 1117)  Weight change:  Filed Weights   08/25/16 1315 08/25/16 1748 08/28/16 1117  Weight: 56.7 kg (125 lb) 57.4 kg (126 lb 9.6 oz) 59.9 kg (132 lb 0.9 oz)    Intake/Output:    Intake/Output Summary (Last 24 hours) at 08/28/16 1123 Last data filed at 08/27/16 1800  Gross per 24 hour  Intake              240 ml  Output                0 ml  Net              240 ml     Physical Exam: General: No acute distress, laying in the bed  HEENT anicteric  Neck supple  Pulm/lungs [place] O2, normal breathing effort  CVS/Heart Irregular, no rub, systolic murmur present  Abdomen:  Soft, nontender, nondistended  Extremities: Both feet and soft boot support. No significant edema  Neurologic: Alert, oriented to person and space  Skin: No acute rashes          Basic Metabolic Panel:   Recent Labs Lab 08/25/16 1432 08/26/16 0531  NA 137 133*  K 3.3* 3.6  CL 100* 98*  CO2 26 22  GLUCOSE 96 143*  BUN 30* 40*  CREATININE 4.81* 5.53*  CALCIUM 9.2 8.9     CBC:  Recent Labs Lab 08/25/16 1432 08/26/16 0531  WBC 4.4 3.4*  NEUTROABS 2.2  --   HGB 9.8* 9.7*  HCT 30.3* 30.1*  MCV 102.6* 101.8*  PLT 147* 152      Microbiology:  Recent Results (from the past 720 hour(s))  Culture, sputum-assessment     Status: None   Collection Time: 08/27/16 12:45 PM  Result Value Ref Range Status   Specimen Description SPUTUM  Final   Special Requests NONE  Final   Sputum evaluation   Final    Sputum specimen not acceptable for testing.  Please  recollect.     Report Status 08/27/2016 FINAL  Final    Coagulation Studies:  Recent Labs  08/25/16 1432  LABPROT 15.1  INR 1.18    Urinalysis:  Recent Labs  08/25/16 1432  COLORURINE AMBER*  LABSPEC 1.012  PHURINE 6.0  GLUCOSEU NEGATIVE  HGBUR MODERATE*  BILIRUBINUR NEGATIVE  KETONESUR NEGATIVE  PROTEINUR 100*  NITRITE NEGATIVE  LEUKOCYTESUR LARGE*      Imaging: No results found.   Medications:    . amiodarone  100 mg Oral Daily  . anastrozole  1 mg Oral Daily  . aspirin EC  81 mg Oral Daily  . cinacalcet  30 mg Oral Q supper  . clopidogrel  75 mg Oral Daily  . ferrous sulfate  325 mg Oral Q breakfast  . furosemide  80 mg Oral Daily  . gabapentin  100 mg Oral TID  . heparin  5,000 Units Subcutaneous Q8H  . irbesartan  300 mg Oral Daily  . isosorbide mononitrate  30 mg  Oral Daily  . levofloxacin  250 mg Oral Q48H  . levothyroxine  100 mcg Oral QAC breakfast  . mirtazapine  15 mg Oral QHS  . multivitamin  1 tablet Oral Daily  . pantoprazole  40 mg Oral Daily  . pravastatin  40 mg Oral q1800  . senna  1 tablet Oral BID  . sevelamer carbonate  800 mg Oral TID WC  . sodium chloride flush  3 mL Intravenous Q12H   acetaminophen **OR** acetaminophen, bisacodyl, diphenhydrAMINE, HYDROcodone-acetaminophen, ondansetron **OR** ondansetron (ZOFRAN) IV, sodium chloride flush  Assessment/ Plan:  83 y.o. female with hypertension, MI requiring hypothermia treatment, congestive heart failure with EF , dilated cardiomyopathy, ESRD, hyperlipidemia, peripheral vascular disease, renal cell carcinoma s/p radiation therapy (RCC followed at [facility]), rt carotid stent 08/2012, Left hip fracture repair (june 2014),  h/o breast cancer. Rt hip fracture s/p ORIF 05/14/2016 [facility]/ MWF-2. Terressa Koyanagi  1.  ESRD - We will arrange for hemodialysis while in the hospital - HD today in chair to assess strength / endurance for outpatient HD   2. AOCKD No EPO due to  metastatic cancer  3. Metastatic breast cancer - recently evaluated bt Dr Janese Banks at cancer center PET CT and CT chest with iv contrast this admission shows metastatic disease to her bones, liver Palliative consult at home tomorrow  4. Hypertension - d/c duplicate ARB therapy - monitor   5. Hypoxia Improved      LOS: 0 Makhari Dovidio 3/11/201811:23 AM

## 2016-08-29 LAB — CBC
HEMATOCRIT: 28 % — AB (ref 35.0–47.0)
HEMATOCRIT: 31.3 % — AB (ref 35.0–47.0)
HEMOGLOBIN: 9.2 g/dL — AB (ref 12.0–16.0)
Hemoglobin: 10.4 g/dL — ABNORMAL LOW (ref 12.0–16.0)
MCH: 33.2 pg (ref 26.0–34.0)
MCH: 33.7 pg (ref 26.0–34.0)
MCHC: 32.9 g/dL (ref 32.0–36.0)
MCHC: 33.2 g/dL (ref 32.0–36.0)
MCV: 101 fL — AB (ref 80.0–100.0)
MCV: 101.4 fL — AB (ref 80.0–100.0)
Platelets: 141 10*3/uL — ABNORMAL LOW (ref 150–440)
Platelets: 135 10*3/uL — ABNORMAL LOW (ref 150–440)
RBC: 2.77 MIL/uL — AB (ref 3.80–5.20)
RBC: 3.08 MIL/uL — ABNORMAL LOW (ref 3.80–5.20)
RDW: 15.7 % — ABNORMAL HIGH (ref 11.5–14.5)
RDW: 15.6 % — AB (ref 11.5–14.5)
WBC: 3.8 10*3/uL (ref 3.6–11.0)
WBC: 3.7 10*3/uL (ref 3.6–11.0)

## 2016-08-29 LAB — RENAL FUNCTION PANEL
Albumin: 2.2 g/dL — ABNORMAL LOW (ref 3.5–5.0)
Anion gap: 10 (ref 5–15)
BUN: 33 mg/dL — AB (ref 6–20)
CHLORIDE: 96 mmol/L — AB (ref 101–111)
CO2: 28 mmol/L (ref 22–32)
Calcium: 7.8 mg/dL — ABNORMAL LOW (ref 8.9–10.3)
Creatinine, Ser: 5.94 mg/dL — ABNORMAL HIGH (ref 0.44–1.00)
GFR calc Af Amer: 7 mL/min — ABNORMAL LOW (ref >60)
GFR, EST NON AFRICAN AMERICAN: 6 mL/min — AB (ref >60)
Glucose, Bld: 75 mg/dL (ref 65–99)
POTASSIUM: 4 mmol/L (ref 3.5–5.1)
Phosphorus: 3.8 mg/dL (ref 2.5–4.6)
Sodium: 134 mmol/L — ABNORMAL LOW (ref 135–145)

## 2016-08-29 LAB — PHOSPHORUS: Phosphorus: 3.7 mg/dL (ref 2.5–4.6)

## 2016-08-29 MED ORDER — LEVOFLOXACIN 500 MG PO TABS
250.0000 mg | ORAL_TABLET | ORAL | Status: AC
  Administered 2016-08-31: 09:00:00 250 mg via ORAL
  Filled 2016-08-29: qty 1

## 2016-08-29 MED ORDER — DEXAMETHASONE 4 MG PO TABS
4.0000 mg | ORAL_TABLET | Freq: Three times a day (TID) | ORAL | Status: DC
  Administered 2016-08-29 – 2016-09-01 (×9): 4 mg via ORAL
  Filled 2016-08-29 (×9): qty 1

## 2016-08-29 NOTE — Progress Notes (Signed)
Hd start 

## 2016-08-29 NOTE — Progress Notes (Signed)
Pre hd assessment  

## 2016-08-29 NOTE — Progress Notes (Signed)
Hemodialysis completed. 

## 2016-08-29 NOTE — Progress Notes (Signed)
Pre hd info 

## 2016-08-29 NOTE — Progress Notes (Signed)
Post hd tx 

## 2016-08-29 NOTE — Progress Notes (Signed)
Subjective:  Patient appears lethargic this a.m. Appears withdrawn when awake. Palliative care consult pending. We plan for hemodialysis later today.    Objective:  Vital signs in last 24 hours:  Temp:  [98 F (36.7 C)-98.6 F (37 C)] 98.6 F (37 C) (03/12 0920) Pulse Rate:  [59-60] 59 (03/12 0920) Resp:  [18-23] 18 (03/12 0920) BP: (102-121)/(38-48) 121/48 (03/12 0920) SpO2:  [95 %-98 %] 95 % (03/12 0920)  Weight change:  Filed Weights   08/25/16 1315 08/25/16 1748 08/28/16 1117  Weight: 56.7 kg (125 lb) 57.4 kg (126 lb 9.6 oz) 59.9 kg (132 lb 0.9 oz)    Intake/Output:    Intake/Output Summary (Last 24 hours) at 08/29/16 1411 Last data filed at 08/28/16 1900  Gross per 24 hour  Intake              120 ml  Output                0 ml  Net              120 ml     Physical Exam: General: No acute distress, laying in the bed  HEENT anicteric  Neck supple  Pulm/lungs [place] O2, normal breathing effort  CVS/Heart Irregular, no rub, systolic murmur present  Abdomen:  Soft, nontender, nondistended  Extremities: Trace edema   Neurologic: Alert, oriented to person and space  Skin: No acute rashes          Basic Metabolic Panel:   Recent Labs Lab 08/25/16 1432 08/26/16 0531  NA 137 133*  K 3.3* 3.6  CL 100* 98*  CO2 26 22  GLUCOSE 96 143*  BUN 30* 40*  CREATININE 4.81* 5.53*  CALCIUM 9.2 8.9     CBC:  Recent Labs Lab 08/25/16 1432 08/26/16 0531  WBC 4.4 3.4*  NEUTROABS 2.2  --   HGB 9.8* 9.7*  HCT 30.3* 30.1*  MCV 102.6* 101.8*  PLT 147* 152      Microbiology:  Recent Results (from the past 720 hour(s))  Culture, sputum-assessment     Status: None   Collection Time: 08/27/16 12:45 PM  Result Value Ref Range Status   Specimen Description SPUTUM  Final   Special Requests NONE  Final   Sputum evaluation   Final    Sputum specimen not acceptable for testing.  Please recollect.     Report Status 08/27/2016 FINAL  Final    Coagulation  Studies: No results for input(s): LABPROT, INR in the last 72 hours.  Urinalysis: No results for input(s): COLORURINE, LABSPEC, PHURINE, GLUCOSEU, HGBUR, BILIRUBINUR, KETONESUR, PROTEINUR, UROBILINOGEN, NITRITE, LEUKOCYTESUR in the last 72 hours.  Invalid input(s): APPERANCEUR    Imaging: No results found.   Medications:    . amiodarone  100 mg Oral Daily  . anastrozole  1 mg Oral Daily  . aspirin EC  81 mg Oral Daily  . cinacalcet  30 mg Oral Q supper  . clopidogrel  75 mg Oral Daily  . dexamethasone  4 mg Oral Q8H  . ferrous sulfate  325 mg Oral Q breakfast  . furosemide  80 mg Oral Daily  . gabapentin  100 mg Oral TID  . heparin  5,000 Units Subcutaneous Q8H  . HYDROcodone-acetaminophen  1 tablet Oral Q4H  . irbesartan  300 mg Oral Daily  . isosorbide mononitrate  30 mg Oral Daily  . levofloxacin  250 mg Oral Q48H  . levothyroxine  100 mcg Oral QAC breakfast  . mirtazapine  15 mg Oral QHS  . multivitamin  1 tablet Oral Daily  . pantoprazole  40 mg Oral Daily  . pravastatin  40 mg Oral q1800  . senna  1 tablet Oral BID  . sevelamer carbonate  800 mg Oral TID WC  . sodium chloride flush  3 mL Intravenous Q12H   acetaminophen **OR** acetaminophen, bisacodyl, diphenhydrAMINE, ondansetron **OR** ondansetron (ZOFRAN) IV, sodium chloride flush  Assessment/ Plan:  83 y.o. female with hypertension, MI requiring hypothermia treatment, congestive heart failure with EF , dilated cardiomyopathy, ESRD, hyperlipidemia, peripheral vascular disease, renal cell carcinoma s/p radiation therapy (RCC followed at [facility]), rt carotid stent 08/2012, Left hip fracture repair (june 2014),  h/o breast cancer. Rt hip fracture s/p ORIF 05/14/2016 [facility]/ MWF-2. Patricia Woodard  1.  ESRD - Patient due for hemodialysis today. We have prepared orders. Palliative care consultation pending to determine goals of care.   2. AOCKD  hold off on Epogen secondary to metastatic disease.  3.  Metastatic breast cancer - recently evaluated bt Dr Janese Banks at cancer center PET CT and CT chest with iv contrast this admission shows metastatic disease to her bones, liver Palliative consult today.   4. Hypertension - Blood pressure well controlled. Continue irbesartan.  5.  Secondary hyperparathyroidism. Continue cinacalcet as well as Renvela.     LOS: 0 Patricia Woodard 3/12/20182:11 PM

## 2016-08-29 NOTE — Progress Notes (Signed)
[place] at [place] NAME: Patricia Woodard    MR#:  [id number]  DATE OF BIRTH:  08/15/1931  SUBJECTIVE; seen at bedside, admitted for shortness of breath, confusion. Patient is slightly alert than yesterday, has mild shortness of breath. Appears very cachectic.   CHIEF COMPLAINT:   Chief Complaint  Patient presents with  . Shortness of Breath   Pt not complaining of pain.   REVIEW OF SYSTEMS:   Review of Systems  Unable to perform ROS: Acuity of condition   Appears confused.  DRUG ALLERGIES:   Allergies  Allergen Reactions  . Penicillins Itching  . Ivp Dye [Iodinated Diagnostic Agents] Rash  . Shellfish Allergy Rash    VITALS:  Blood pressure (!) 121/48, pulse (!) 59, temperature 98.6 F (37 C), temperature source Oral, resp. rate 18, height 5\' 7"  (1.702 m), weight 132 lb 0.9 oz (59.9 kg), SpO2 95 %.  PHYSICAL EXAMINATION:  GENERAL:  83 y.o.-year-old patient lying in the bed w,cachectic. EYES: Pupils equal, round, reactive to light .no scleral icterus. HEENT: Head atraumatic, normocephalic. Oropharynx and nasopharynx clear.  NECK:  Supple, no jugular venous distention. No thyroid enlargement, no tenderness.  LUNGS: decreased  breath sounds bilaterally. Crackles in the left base, CARDIOVASCULAR: S1, S2 normal. No murmurs, rubs, or gallops.  ABDOMEN: Soft, nontender, nondistended. Bowel sounds present. No organomegaly or mass.  EXTREMITIES: No pedal edema, cyanosis, or clubbing.  NEUROLOGIC: pt is alert, angage in simple communications, and follows commands, moves all limbs slowly, generalized weak. PSYCHIATRIC:  Awake and oriented.  LABORATORY PANEL:   CBC  Recent Labs Lab 08/26/16 0531  WBC 3.4*  HGB 9.7*  HCT 30.1*  PLT 152   ------------------------------------------------------------------------------------------------------------------  Chemistries   Recent Labs Lab 08/25/16 1432 08/26/16 0531  NA  137 133*  K 3.3* 3.6  CL 100* 98*  CO2 26 22  GLUCOSE 96 143*  BUN 30* 40*  CREATININE 4.81* 5.53*  CALCIUM 9.2 8.9  AST 93*  --   ALT 22  --   ALKPHOS 141*  --   BILITOT 0.7  --    ------------------------------------------------------------------------------------------------------------------  Cardiac Enzymes  Recent Labs Lab 08/25/16 1432  TROPONINI 0.24*   ------------------------------------------------------------------------------------------------------------------  RADIOLOGY:  No results found.  EKG:   Orders placed or performed during the hospital encounter of 08/25/16  . ED EKG  . ED EKG    ASSESSMENT AND PLAN:   #1. Acute respiratory failure with hypoxia, acute on chronic diastolic heart failure: hd per nephrology # 2. Hypotension;: Improved , stopped amlodipine due to low BP. #3 bradycardia: Rate improved rate was 59 yesterday, now it is  In 20s. Continue to hold Coreg, clonidine . #4. History of right-sided breast cancer with bone metastases, liver metastases as per oncology note prognosis poor, I will start patient on Decadron  Palliative care consulted to discuss the goals and decide about her DO NOT RESUSCITATE, hospice referral. History of macrocytic anemia #5 stage II sacral decubiti, stage I left heel ulcer: Seen by wound care, continue wound care as ordered.. #6 anorexia due to cancer: #7 metabolic encephalopathy  Due to pain and medication #8 history of PAD, , atrial fibrillation, coronary artery disease: Patient is on aspirin, Plavix, Imdur, statins,  Coreg on  hold because of bradycardia. #9. UTI- levaquin  Pt bothers to mid with palliative care to discuss further ulcer care, patient with metastatic breast cancer   All the records are reviewed and case discussed with  Care Management/Social Workerr. Management plans discussed with the patient, family and they are in agreement.  CODE STATUS: DNR  TOTAL TIME TAKING CARE OF THIS PATIENT: 35  minutes.  POSSIBLE D/C IN 1-2 DAYS, DEPENDING ON CLINICAL CONDITION.  Dustin Flock M.D on 08/29/2016 at 1:19 PM  Between 7am to 6pm - Pager - [phone]  After 6pm go to [email or web address] - password [email or web address] [facility]  Office  [phone]  CC: Primary care physician; Madelyn Brunner, MD   Note: This dictation was prepared with Dragon dictation along with smaller phrase technology. Any transcriptional errors that result from this process are unintentional.

## 2016-08-30 DIAGNOSIS — I5033 Acute on chronic diastolic (congestive) heart failure: Secondary | ICD-10-CM

## 2016-08-30 DIAGNOSIS — Z515 Encounter for palliative care: Secondary | ICD-10-CM

## 2016-08-30 DIAGNOSIS — Z66 Do not resuscitate: Secondary | ICD-10-CM | POA: Diagnosis not present

## 2016-08-30 DIAGNOSIS — C799 Secondary malignant neoplasm of unspecified site: Secondary | ICD-10-CM

## 2016-08-30 DIAGNOSIS — N186 End stage renal disease: Secondary | ICD-10-CM | POA: Diagnosis not present

## 2016-08-30 LAB — PHOSPHORUS: PHOSPHORUS: 3.6 mg/dL (ref 2.5–4.6)

## 2016-08-30 NOTE — Progress Notes (Addendum)
HD completed. Venous chamber clotted with 22 minutes remaining. No heparin treatment. All blood able to be returned. Patient tolerated HD in recliner well without complaint. She states "the pillows help." Hemostasis achieved after 10 minutes. Access bandaged. Clean dry and intact. Report called to primary RN. Transport called for patient

## 2016-08-30 NOTE — Progress Notes (Signed)
Post HD assessment unchanged  

## 2016-08-30 NOTE — Progress Notes (Signed)
Subjective:  We had an extended meeting today with the patient and her family as well as palliative care. After extensive discussion it appears that the patient would like to continue to try to perform hemodialysis. This is a in light ff underlying metastatic cancer which she is aware of.    Objective:  Vital signs in last 24 hours:  Temp:  [97.7 F (36.5 C)-98.5 F (36.9 C)] 98.5 F (36.9 C) (03/13 0454) Pulse Rate:  [59-60] 59 (03/13 0835) Resp:  [9-19] 18 (03/13 0454) BP: (114-171)/(53-69) 171/60 (03/13 0835) SpO2:  [90 %-100 %] 90 % (03/13 0454) Weight:  [59.9 kg (132 lb 0.9 oz)] 59.9 kg (132 lb 0.9 oz) (03/12 1439)  Weight change: 0 kg (0 lb) Filed Weights   08/25/16 1748 08/28/16 1117 08/29/16 1439  Weight: 57.4 kg (126 lb 9.6 oz) 59.9 kg (132 lb 0.9 oz) 59.9 kg (132 lb 0.9 oz)    Intake/Output:    Intake/Output Summary (Last 24 hours) at 08/30/16 1330 Last data filed at 08/29/16 1825  Gross per 24 hour  Intake                0 ml  Output             1000 ml  Net            -1000 ml     Physical Exam: General: No acute distress, laying in the bed  HEENT anicteric  Neck supple  Pulm/lungs CTABnormal breathing effort  CVS/Heart Irregular, no rub, systolic murmur present  Abdomen:  Soft, nontender, nondistended  Extremities: Trace edema   Neurologic: Alert, oriented to person and space  Skin: No acute rashes          Basic Metabolic Panel:   Recent Labs Lab 08/25/16 1432 08/26/16 0531 08/29/16 1450  NA 137 133* 134*  K 3.3* 3.6 4.0  CL 100* 98* 96*  CO2 26 22 28   GLUCOSE 96 143* 75  BUN 30* 40* 33*  CREATININE 4.81* 5.53* 5.94*  CALCIUM 9.2 8.9 7.8*  PHOS  --   --  3.8  3.7     CBC:  Recent Labs Lab 08/25/16 1432 08/26/16 0531 08/29/16 1450 08/29/16 2024  WBC 4.4 3.4* 3.8 3.7  NEUTROABS 2.2  --   --   --   HGB 9.8* 9.7* 9.2* 10.4*  HCT 30.3* 30.1* 28.0* 31.3*  MCV 102.6* 101.8* 101.0* 101.4*  PLT 147* 152 141* 135*       Microbiology:  Recent Results (from the past 720 hour(s))  Culture, sputum-assessment     Status: None   Collection Time: 08/27/16 12:45 PM  Result Value Ref Range Status   Specimen Description SPUTUM  Final   Special Requests NONE  Final   Sputum evaluation   Final    Sputum specimen not acceptable for testing.  Please recollect.     Report Status 08/27/2016 FINAL  Final    Coagulation Studies: No results for input(s): LABPROT, INR in the last 72 hours.  Urinalysis: No results for input(s): COLORURINE, LABSPEC, PHURINE, GLUCOSEU, HGBUR, BILIRUBINUR, KETONESUR, PROTEINUR, UROBILINOGEN, NITRITE, LEUKOCYTESUR in the last 72 hours.  Invalid input(s): APPERANCEUR    Imaging: No results found.   Medications:    . amiodarone  100 mg Oral Daily  . anastrozole  1 mg Oral Daily  . aspirin EC  81 mg Oral Daily  . cinacalcet  30 mg Oral Q supper  . clopidogrel  75 mg Oral Daily  .  dexamethasone  4 mg Oral Q8H  . ferrous sulfate  325 mg Oral Q breakfast  . furosemide  80 mg Oral Daily  . gabapentin  100 mg Oral TID  . heparin  5,000 Units Subcutaneous Q8H  . HYDROcodone-acetaminophen  1 tablet Oral Q4H  . irbesartan  300 mg Oral Daily  . isosorbide mononitrate  30 mg Oral Daily  . [START ON 08/31/2016] levofloxacin  250 mg Oral Q48H  . levothyroxine  100 mcg Oral QAC breakfast  . mirtazapine  15 mg Oral QHS  . multivitamin  1 tablet Oral Daily  . pantoprazole  40 mg Oral Daily  . pravastatin  40 mg Oral q1800  . sevelamer carbonate  800 mg Oral TID WC  . sodium chloride flush  3 mL Intravenous Q12H   acetaminophen **OR** acetaminophen, diphenhydrAMINE, ondansetron **OR** ondansetron (ZOFRAN) IV, sodium chloride flush  Assessment/ Plan:  83 y.o. female with hypertension, MI requiring hypothermia treatment, congestive heart failure with EF , dilated cardiomyopathy, ESRD, hyperlipidemia, peripheral vascular disease, renal cell carcinoma s/p radiation therapy (RCC  followed at [facility]), rt carotid stent 08/2012, Left hip fracture repair (june 2014),  h/o breast cancer. Rt hip fracture s/p ORIF 05/14/2016 [facility] [address] [facility]/ MWF-2. Terressa Koyanagi  1.  ESRD - we had a long discussion with the patient and her family today.  The patient is known to have metastatic cancer for which there is no treatment as well as underlying end-stage renal disease.  Recently she is been unable to sit up to perform hemodialysis.  However at this time patient continues to desire hemodialysis and wants to try dialysis in the seated position again today.  Therefore we will attempt another dialysis treatment without ultrafiltration today to see if she can tolerate sitting up for at least 3.5 hours.   2. AOCKD  hemoglobin currently up to 10.4.  Hold off on Epogen given underlying metastatic disease.  3. Metastatic breast cancer - recently evaluated bt Dr Janese Banks at [facility] PET CT and CT chest with iv contrast this admission shows metastatic disease to her bones, liver Palliative consult has been obtained and it appears that the patient continues to desire hemodialysis at this time.   4. Hypertension - blood pressure currently 171/60 but has been as low as 114/53.  Continue Irbesartan.  5.  Secondary hyperparathyroidism. Phosphorous acceptable at 3.8.  Continue Renvela.    LOS: 0 Omolara Carol 3/13/20181:30 PM

## 2016-08-30 NOTE — Progress Notes (Signed)
Pre hd info 

## 2016-08-30 NOTE — Progress Notes (Signed)
Hemodialysis treatment started without complications.Patient dialyzing in dialysis chair.

## 2016-08-30 NOTE — Progress Notes (Signed)
[place] [facility] [place] NAME: Patricia Woodard    MR#:  [id number]  DATE OF BIRTH:  [birth year]  CHIEF COMPLAINT:   Chief Complaint  Patient presents with  . Shortness of Breath   Currently denies any pain daughter is at bedside.   REVIEW OF SYSTEMS:   Review of Systems  Unable to perform ROS: Acuity of condition   Appears confused.But more awake  DRUG ALLERGIES:   Allergies  Allergen Reactions  . Penicillins Itching  . Ivp Dye [Iodinated Diagnostic Agents] Rash  . Shellfish Allergy Rash    VITALS:  Blood pressure (!) 171/60, pulse (!) 59, temperature 98.5 F (36.9 C), temperature source Oral, resp. rate 18, height _0  (1.702 m), weight 132 lb 0.9 oz (59.9 kg), SpO2 90 %.  PHYSICAL EXAMINATION:  GENERAL:  83 y.o.-year-old patient lying in the bed w,cachectic. EYES: Pupils equal, round, reactive to light .no scleral icterus. HEENT: Head atraumatic, normocephalic. Oropharynx and nasopharynx clear.  NECK:  Supple, no jugular venous distention. No thyroid enlargement, no tenderness.  LUNGS: decreased  breath sounds bilaterally. Crackles in the left base, CARDIOVASCULAR: S1, S2 normal. No murmurs, rubs, or gallops.  ABDOMEN: Soft, nontender, nondistended. Bowel sounds present. No organomegaly or mass.  EXTREMITIES: No pedal edema, cyanosis, or clubbing.  NEUROLOGIC: pt is alert, follows commands, moves all limbs slowly, generalized weak. PSYCHIATRIC:  Awake, Not anxious or depressed  LABORATORY PANEL:   CBC  Recent Labs Lab 08/29/16 2024  WBC 3.7  HGB 10.4*  HCT 31.3*  PLT 135*   ------------------------------------------------------------------------------------------------------------------  Chemistries   Recent Labs Lab 08/25/16 1432  08/29/16 1450  NA 137  < > 134*  K 3.3*  < > 4.0  CL 100*  < > 96*  CO2 26  < > 28  GLUCOSE 96  < > 75  BUN 30*  < > 33*  CREATININE 4.81*  < > 5.94*  CALCIUM 9.2  < >  7.8*  AST 93*  --   --   ALT 22  --   --   ALKPHOS 141*  --   --   BILITOT 0.7  --   --   < > = values in this interval not displayed. ------------------------------------------------------------------------------------------------------------------  Cardiac Enzymes  Recent Labs Lab 08/25/16 1432  TROPONINI 0.24*   ------------------------------------------------------------------------------------------------------------------  RADIOLOGY:  No results found.  EKG:   Orders placed or performed during the hospital encounter of 08/25/16  . ED EKG  . ED EKG    ASSESSMENT AND PLAN:   #1. Acute respiratory failure with hypoxia, acute on chronic diastolic heart failure: hd per nephrology # 2. Hypotension;: Improved , stopped amlodipine due to low BP. #3 bradycardia: Rate improved rate . Continue to hold Coreg, clonidine . #4. History of right-sided breast cancer with bone metastases, liver metastases as per oncology note prognosis poor,Pain improved continue Decadron  Palliative care consulted to discuss the goals and decide about her DO NOT RESUSCITATE,  Patient family met with palliative care today they will try to sit in a chair to see if she'll be able to tolerate dialysis if not family is leaning towards comfort care #5 stage II sacral decubiti, stage I left heel ulcer: Seen by wound care, continue wound care as ordered.. #6 anorexia due to cancer: #7 metabolic encephalopathy  Due to pain and medication #8 history of PAD, , atrial fibrillation, coronary artery disease: Patient is on aspirin, Plavix, Imdur, statins,  Coreg on  hold because of bradycardia. #9. UTI- status post treatment     All the records are reviewed and case discussed with Care Management/Social Workerr. Management plans discussed with the patient, family and they are in agreement.  CODE STATUS: DNR  TOTAL TIME TAKING CARE OF THIS PATIENT: 32 minutes.  POSSIBLE D/C IN 1-2 DAYS, DEPENDING ON CLINICAL  CONDITION.  Dustin Flock M.D on 08/30/2016 at 12:30 PM  Between 7am to 6pm - Pager - [phone]  After 6pm go to [email or web address] - password [email or web address]  Office  [phone]  CC: Primary care physician; Madelyn Brunner, MD   Note: This dictation was prepared with Dragon dictation along with smaller phrase technology. Any transcriptional errors that result from this process are unintentional.

## 2016-08-30 NOTE — Evaluation (Signed)
Physical Therapy Evaluation Patient Details Name: Patricia Woodard MRN: [id number] DOB: Mar 10, 1932 Today's Date: 08/30/2016   History of Present Illness  presented to ER secondary to SOB, hypoxia and hypotension; admitted with acute respiratory failure with hypoxia due to CHF exacerbation.  Clinical Impression  Upon evaluation, patient alert and oriented; follows all commands.  Eager for [place], but globally weak and deconditioned throughout all extremities.  Currently requiring mod/max assist for bed mobility and max assist +2 for sit/stand, basic transfers with RW.  Poor standing tolerance and standing balance; extensive hands-on assist at all times.  May benefit from education regarding alternate transfer techniques in subsequent sessions. While significant impairment noted with functional mobility, patient level of care appears similar to what daughters have been providing at home (was discharged from STR due to limited functional progress per chart).  Daughters comfortable with home set up and existing equipment (would like hospital bed), comfortable with current level of care. Would benefit from skilled PT to address above deficits and promote optimal return to PLOF; Recommend transition to [place] upon discharge from acute hospitalization and continued 24 hour sup/assist from daughters.    Follow Up Recommendations Home health PT;Supervision/Assistance - 24 hour    Equipment Recommendations       Recommendations for Other Services       Precautions / Restrictions Precautions Precautions: Fall Precaution Comments: No BP L UE Restrictions Weight Bearing Restrictions: No      Mobility  Bed Mobility Overal bed mobility: Needs Assistance Bed Mobility: Supine to Sit     Supine to sit: Mod assist;Max assist        Transfers Overall transfer level: Needs assistance Equipment used: Rolling walker (2 wheeled) Transfers: Sit to/from Stand Sit to Stand: Mod assist;Max assist;+2 physical  assistance         General transfer comment: assist for forward weight shift, lift off and static standing balance; cuing for bilat TKE in closed-chain position as fatigue increases  Ambulation/Gait Ambulation/Gait assistance: Max assist;Mod assist Ambulation Distance (Feet): 2 Feet Assistive device: Rolling walker (2 wheeled)       General Gait Details: significant difficulty initiating/completing LE advancement due to weakness, impaired balance and fear of falling; max assist for standing balance, weight shift and overall assist.  Unable to tolerate true gait training; recommend WC level as primary mobility at thist ime  Stairs            Wheelchair Mobility    Modified Rankin (Stroke Patients Only)       Balance Overall balance assessment: Needs assistance Sitting-balance support: No upper extremity supported;Feet supported Sitting balance-Leahy Scale: Good     Standing balance support: Bilateral upper extremity supported Standing balance-Leahy Scale: Poor                               Pertinent Vitals/Pain Pain Assessment: No/denies pain    Home Living Family/patient expects to be discharged to:: Private residence Living Arrangements: Children Available Help at Discharge: Available 24 hours/day Type of Home: House Home Access: Ramped entrance     Home Layout: One level Home Equipment: Environmental consultant - 2 wheels;Wheelchair - manual;Bedside commode (lift chair)      Prior Function           Comments: Prior to hip fracture, was ambulatory for household distances with RW, indep with ADLs.  Since hip fracture (November, 2017), has had limited mobility and has been WC level as primary  mobility.  Completed course of STR with discharge home on 2/28 with 24/7 care from family and HHPT.     Hand Dominance        Extremity/Trunk Assessment   Upper Extremity Assessment Upper Extremity Assessment: Overall WFL for tasks assessed    Lower Extremity  Assessment Lower Extremity Assessment: Generalized weakness (grossly 4-/5 throughout; supports body weight but requires cuing for TKE in close-dchain as fatigue increases)       Communication   Communication: HOH  Cognition Arousal/Alertness: Awake/alert Behavior During Therapy: WFL for tasks assessed/performed Overall Cognitive Status: Within Functional Limits for tasks assessed                      General Comments      Exercises Other Exercises Other Exercises: Sit/stand with RW x3, max assist +2.  Maintains for 10-20 seconds prior to need for seated rest; constant cuing for TKE in closed-chain.  Poor balance. Other Exercises: Dep assist for hygiene after incontinent BM in standing.   Assessment/Plan    PT Assessment Patient needs continued PT services  PT Problem List Decreased strength;Decreased range of motion;Decreased activity tolerance;Decreased balance;Decreased mobility;Decreased knowledge of use of DME;Decreased knowledge of precautions;Decreased safety awareness;Cardiopulmonary status limiting activity       PT Treatment Interventions DME instruction;Gait training;Functional mobility training;Therapeutic activities;Therapeutic exercise;Balance training;Patient/family education    PT Goals (Current goals can be found in the Care Plan section)  Acute Rehab PT Goals Patient Stated Goal: to return home with daughters PT Goal Formulation: With patient/family Time For Goal Achievement: 09/13/16 Potential to Achieve Goals: Fair    Frequency Min 2X/week   Barriers to discharge        Co-evaluation               End of Session Equipment Utilized During Treatment: Gait belt;Oxygen Activity Tolerance: Patient tolerated treatment well;Patient limited by fatigue Patient left: in chair;with call bell/phone within reach;with family/visitor present;with nursing/sitter in room (in dialysis chair for upcoming dialysis session) Nurse Communication: Mobility  status PT Visit Diagnosis: Muscle weakness (generalized) (M62.81);Unsteadiness on feet (R26.81)    Functional Assessment Tool Used: AM-PAC 6 Clicks Basic Mobility;Clinical judgement Functional Limitation: Mobility: Walking and moving around Mobility: Walking and Moving Around Current Status (P5361): At least 80 percent but less than 100 percent impaired, limited or restricted Mobility: Walking and Moving Around Goal Status [phone]): At least 20 percent but less than 40 percent impaired, limited or restricted    Time: 1207-1233 PT Time Calculation (min) (ACUTE ONLY): 26 min   Charges:   PT Evaluation $PT Eval Moderate Complexity: 1 Procedure PT Treatments $Therapeutic Activity: 8-22 mins   PT G Codes:   PT G-Codes **NOT FOR INPATIENT CLASS** Functional Assessment Tool Used: AM-PAC 6 Clicks Basic Mobility;Clinical judgement Functional Limitation: Mobility: Walking and moving around Mobility: Walking and Moving Around Current Status (Q0086): At least 80 percent but less than 100 percent impaired, limited or restricted Mobility: Walking and Moving Around Goal Status [phone]): At least 20 percent but less than 40 percent impaired, limited or restricted     Everest Brod H. Owens Shark, PT, DPT, NCS 08/30/16, 4:19 PM [phone]

## 2016-08-30 NOTE — Progress Notes (Signed)
Pre-hd tx 

## 2016-08-30 NOTE — Care Management Obs Status (Signed)
[facility] NOTIFICATION   Patient Details  Name: BECKA LAGASSE MRN: [id number] Date of Birth: Jul 14, 1931   Medicare Observation Status Notification Given:  Yes    Darryll Capers, RN 08/30/2016, 3:03 PM

## 2016-08-30 NOTE — Consult Note (Signed)
Consultation Note Date: 08/30/2016   Patient Name: Patricia Woodard  DOB: [birth year]  MRN: [id number]  Age / Sex: 83 y.o., female  PCP: Madelyn Brunner, MD Referring Physician: Dustin Flock, MD  Reason for Consultation: Establishing goals of care and Psychosocial/spiritual support  HPI/Patient Profile:   83 y.o. female  admitted on 08/25/2016 with  a known history of End-stage renal disease, on dialysis, paroxysmal atrial fibrillation, diastolic CHF (ICD) chronic, h/o breast and renal cancer now with  metastases to lung and liver and bone, hypertension, coronary artery disease, hypertension, peripheral vascular disease, severe tricuspid regurgitation, who presents to the hospital with complaints of hypoxia and hypotension.   Per oncology notes: recent PET scan showed multiple bone metastases as well as liver metastases   Given her multiple comorbidities including end-stage renal disease as well as advanced heart disease and chronic lung disease she is not a candidate for any aggressive chemotherapy.  According to the patient's family, patient was discharged from skilled nursing facility due to poor progression, she's been managed at home by her family last week. "it has not been easy".    Home health nurse came to visit her today, she noted her blood pressure being somewhat low, her oxygen levels were also low in 80s. She was brought to emergency room for further evaluation and treatment.She has a sacral pressure wound  Patient has had continued physical, fucntional and cognitive decline.  Oncology is not recommending systemic treatement.   Family face advanced directive decisions and anticipatory care needs   Clinical Assessment and Goals of Care:  This NP Wadie Lessen reviewed medical records, received report from team, assessed the patient and then meet at the patient's bedside along with her daughters  [name], Marthe Patch and Angelita Ingles  to discuss diagnosis, prognosis, GOC, EOL wishes disposition and options.  Dr Milana Huntsman particiapted and outlined her care from a nephrology standpoint.  Most of today's conversation was in regards to continuation of  dialysis. Discussed in detail the likelihood that patient will be able to continue much longer with dialysis is slim.  She is getting weaker and her cancer will progress.  However patient verbalizes desire to continue dialysis and family want to "give her the chance".  It is decided that today she will be given a gentle dialysis treatment sitting in the chair as a trial for continued OP dialysis.   A discussion was had today regarding advanced directives.  The difference between a aggressive medical intervention path  and a palliative comfort care path for this patient at this time was had.  Values and goals of care important to patient and family were attempted to be elicited.  Concept of Hospice and hospice facility eligibility were discussed  Natural trajectory and expectations at EOL were discussed.  Questions and concerns addressed.   Family encouraged to call with questions or concerns.  PMT will continue to support holistically.   SUMMARY OF RECOMMENDATIONS    Code Status/Advance Care Planning:  DNR   Palliative Prophylaxis:   Aspiration,  Bowel Regimen, Delirium Protocol, Frequent Pain Assessment, Oral Care and Palliative Wound Care  Additional Recommendations (Limitations, Scope, Preferences):  Today Dr Holley Raring is giving the patient a trail of "gentel dialysis" sitting up in the chiar to see if she can toelarte OP dialysis on discharge.  Decision to continue and ability to do so affects all EOL decisions.   Psycho-social/Spiritual:   Desire for further Chaplaincy support:no  Additional Recommendations: Education on Hospice  Prognosis:   < 6 months- poor prognosis, high risk for decompensation  Discharge Planning: We  discussed the options of a hospice facility if decision to stop dialysis  was made, however if decision is to continue dialysis patient will need to discharge to SNF or home.   This was discussed in detail with the family today.  This NP will f/u in the morning and touch base with the family for decisions regarding EOL wishes and discharge plan.   To Be Determined      Primary Diagnoses: Present on Admission: **None**   I have reviewed the medical record, interviewed the patient and family, and examined the patient. The following aspects are pertinent.  Past Medical History:  Diagnosis Date  . Anemia   . Arrhythmia   . Cancer ([place])    breast ca - left, kidney  . Dialysis AV fistula infection ([place])    Left arm Mon, Wed, Fri dialysis  . Hypertension   . MI, old   . Renal insufficiency    dialysis  . Stroke [facility])    2009   Social History   Social History  . Marital status: Widowed    Spouse name: N/A  . Number of children: N/A  . Years of education: N/A   Social History Main Topics  . Smoking status: Former Smoker    Packs/day: 0.50    Years: 60.00    Quit date: 2017  . Smokeless tobacco: Former Systems developer    Quit date: 04/2016  . Alcohol use No  . Drug use: No  . Sexual activity: Not Asked   Other Topics Concern  . None   Social History Narrative  . None   History reviewed. No pertinent family history. Scheduled Meds: . amiodarone  100 mg Oral Daily  . anastrozole  1 mg Oral Daily  . aspirin EC  81 mg Oral Daily  . cinacalcet  30 mg Oral Q supper  . clopidogrel  75 mg Oral Daily  . dexamethasone  4 mg Oral Q8H  . ferrous sulfate  325 mg Oral Q breakfast  . furosemide  80 mg Oral Daily  . gabapentin  100 mg Oral TID  . heparin  5,000 Units Subcutaneous Q8H  . HYDROcodone-acetaminophen  1 tablet Oral Q4H  . irbesartan  300 mg Oral Daily  . isosorbide mononitrate  30 mg Oral Daily  . [START ON 08/31/2016] levofloxacin  250 mg Oral Q48H  . levothyroxine  100  mcg Oral QAC breakfast  . mirtazapine  15 mg Oral QHS  . multivitamin  1 tablet Oral Daily  . pantoprazole  40 mg Oral Daily  . pravastatin  40 mg Oral q1800  . sevelamer carbonate  800 mg Oral TID WC  . sodium chloride flush  3 mL Intravenous Q12H   Continuous Infusions: PRN Meds:.acetaminophen **OR** acetaminophen, diphenhydrAMINE, ondansetron **OR** ondansetron (ZOFRAN) IV, sodium chloride flush Medications Prior to Admission:  Prior to Admission medications   Medication Sig Start Date End Date Taking? Authorizing Provider  acetaminophen (TYLENOL) 325 MG  tablet Take 2 tablets (650 mg total) by mouth every 6 (six) hours as needed for mild pain (or Fever >/= 101). 05/17/16  Yes Theodoro Grist, MD  Amino Acid Infusion (PROSOL) 20 % SOLN  06/15/16  Yes Historical Provider, MD  amiodarone (PACERONE) 200 MG tablet Take 200 mg by mouth daily.   Yes Historical Provider, MD  amLODipine (NORVASC) 10 MG tablet Take 10 mg by mouth daily.   Yes Historical Provider, MD  aspirin EC 81 MG tablet Take 81 mg by mouth daily.   Yes Historical Provider, MD  Azilsartan Medoxomil (EDARBI) 80 MG TABS Take 80 mg by mouth daily.   Yes Historical Provider, MD  bisacodyl (DULCOLAX) 10 MG suppository Place 1 suppository (10 mg total) rectally daily as needed for moderate constipation. 05/17/16  Yes Theodoro Grist, MD  carvedilol (COREG) 6.25 MG tablet Take 6.25 mg by mouth 2 (two) times daily with a meal.  07/30/16  Yes Historical Provider, MD  cloNIDine (CATAPRES) 0.1 MG tablet Take 0.1 mg by mouth 3 (three) times daily. As directed   Yes Historical Provider, MD  clopidogrel (PLAVIX) 75 MG tablet Take 75 mg by mouth daily.   Yes Historical Provider, MD  diphenhydrAMINE (BENADRYL) 25 MG tablet Take 25 mg by mouth every 6 (six) hours as needed.   Yes Historical Provider, MD  esomeprazole (NEXIUM) 20 MG capsule Take 20 mg by mouth daily at 12 noon.   Yes Historical Provider, MD  ferrous sulfate 325 (65 FE) MG tablet Take  1 tablet (325 mg total) by mouth daily with breakfast. 05/18/16  Yes Theodoro Grist, MD  furosemide (LASIX) 80 MG tablet TAKE ONE TABLET BY MOUTH EVERY DAY 12/04/11  Yes Jackolyn Confer, MD  gabapentin (NEURONTIN) 300 MG capsule Take 300 mg by mouth 3 (three) times daily.  07/12/16  Yes Historical Provider, MD  hydrALAZINE (APRESOLINE) 100 MG tablet Take 100 mg by mouth 2 (two) times daily.   Yes Historical Provider, MD  isosorbide mononitrate (IMDUR) 30 MG 24 hr tablet Take 30 mg by mouth daily.  07/05/16  Yes Historical Provider, MD  levothyroxine (SYNTHROID, LEVOTHROID) 100 MCG tablet Take 100 mcg by mouth daily before breakfast.   Yes Historical Provider, MD  losartan (COZAAR) 100 MG tablet TAKE ONE TABLET BY MOUTH ONCE DAILY 10/15/14  Yes Historical Provider, MD  lovastatin (MEVACOR) 40 MG tablet TAKE ONE TABLET BY MOUTH AT BEDTIME 05/29/11  Yes Jackolyn Confer, MD  mirtazapine (REMERON) 15 MG tablet Take 15 mg by mouth at bedtime.   Yes Historical Provider, MD  multivitamin (RENA-VIT) TABS tablet Take 1 tablet by mouth daily. 0.8mg    Yes Historical Provider, MD  senna (SENOKOT) 8.6 MG TABS tablet Take 1 tablet (8.6 mg total) by mouth 2 (two) times daily. 05/17/16  Yes Theodoro Grist, MD  SENSIPAR 30 MG tablet TAKE ONE TABLET BY MOUTH AT DINNER. Patient taking differently: TAKE ONE TABLET BY MOUTH ON DIALYSIS DAYS. 02/27/11  Yes Jackolyn Confer, MD  sevelamer carbonate (RENVELA) 800 MG tablet Take 800 mg by mouth 3 (three) times daily with meals.   Yes Historical Provider, MD  anastrozole (ARIMIDEX) 1 MG tablet Take 1 mg by mouth daily.    Historical Provider, MD   Allergies  Allergen Reactions  . Penicillins Itching  . Ivp Dye [Iodinated Diagnostic Agents] Rash  . Shellfish Allergy Rash   Review of Systems  Constitutional: Positive for fatigue.  Neurological: Positive for weakness.    Physical Exam  Constitutional: She appears cachectic. She appears ill.  Cardiovascular: Normal rate,  regular rhythm and normal heart sounds.   Pulmonary/Chest: She has decreased breath sounds.  Neurological: She is alert.  - generalized weakness and fatigue  -patient intermittently confused 2/2 letahtgy    Vital Signs: BP (!) 171/60   Pulse (!) 59   Temp 98.5 F (36.9 C) (Oral)   Resp 18   Ht 5\' 7"  (1.702 m)   Wt 59.9 kg (132 lb 0.9 oz)   SpO2 90%   BMI 20.68 kg/m  Pain Assessment: No/denies pain   Pain Score: 0-No pain   SpO2: SpO2: 90 % O2 Device:SpO2: 90 % O2 Flow Rate: .O2 Flow Rate (L/min): 1 L/min  IO: Intake/output summary:  Intake/Output Summary (Last 24 hours) at 08/30/16 0848 Last data filed at 08/29/16 1825  Gross per 24 hour  Intake                0 ml  Output             1000 ml  Net            -1000 ml    LBM: Last BM Date: 08/28/16 Baseline Weight: Weight: 56.7 kg (125 lb) Most recent weight: Weight: 59.9 kg (132 lb 0.9 oz)      Palliative Assessment/Data: 20 %   Flowsheet Rows   Flowsheet Row Most Recent Value  Intake Tab  Referral Department  Hospitalist  Unit at Time of Referral  Oncology Unit  Palliative Care Primary Diagnosis  Cancer  Date Notified  08/25/16  Palliative Care Type  New Palliative care  Reason for referral  Clarify Goals of Care  Date of Admission  08/25/16  # of days IP prior to Palliative referral  0  Clinical Assessment  Psychosocial & Spiritual Assessment  Palliative Care Outcomes     Discussed with dr Posey Pronto   Time In: 1000 Time Out: 1115 Time Total: 75 min Greater than 50%  of this time was spent counseling and coordinating care related to the above assessment and plan.  Signed by: Wadie Lessen, NP   Please contact Palliative Medicine Team phone at [phone] for questions and concerns.  For individual provider: See Shea Evans

## 2016-08-31 DIAGNOSIS — Z66 Do not resuscitate: Secondary | ICD-10-CM | POA: Diagnosis not present

## 2016-08-31 DIAGNOSIS — N186 End stage renal disease: Secondary | ICD-10-CM | POA: Diagnosis not present

## 2016-08-31 DIAGNOSIS — C799 Secondary malignant neoplasm of unspecified site: Secondary | ICD-10-CM | POA: Diagnosis not present

## 2016-08-31 DIAGNOSIS — Z515 Encounter for palliative care: Secondary | ICD-10-CM | POA: Diagnosis not present

## 2016-08-31 LAB — HEPATITIS B SURFACE ANTIGEN: Hepatitis B Surface Ag: NEGATIVE

## 2016-08-31 MED ORDER — DEXAMETHASONE 4 MG PO TABS: 4.0000 mg | ORAL_TABLET | Freq: Three times a day (TID) | ORAL | 0 refills | Status: AC

## 2016-08-31 MED ORDER — HYDROCODONE-ACETAMINOPHEN 5-325 MG PO TABS: 1.0000 | ORAL_TABLET | ORAL | 0 refills | Status: AC

## 2016-08-31 NOTE — Discharge Summary (Deleted)
Patricia Woodard at [facility], 83 y.o., DOB 10/23/1931, MRN [id number]. Admission date: 08/25/2016 Discharge Date 08/31/2016 Primary MD Madelyn Brunner, MD Admitting Physician Theodoro Grist, MD  Admission Diagnosis  SOB (shortness of breath) [R06.02]  Discharge Diagnosis   Active Problems:   Acute respiratory failure with hypoxia (HCC)   Acute on chronic diastolic CHF (congestive heart failure) (HCC)   End stage renal disease (HCC)   Generalized weakness   Hypokalemia   Pressure injury of skin   DNR (do not resuscitate)   Palliative care by specialist   Metastatic cancer ([place])  bradycardia Metabolic encephalopathy Peripheral arterial disease Atrial fibrillation UTI status post treatment       [place]  is a 83 y.o. 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 According to the patient's family, patient was discharged from skilled nursing facility due to poor progression, she's been managed at home by her family. Patient was admitted for further evaluation and therapy. She was thought to have acute on chronic diastolic CHF. Patient has metastatic breast cancer to the liver and bone. With severe pain. Patient  Was unable to lay flat for HD due to metastatic pain. Her prognosis is very poor. Discussion with family were held. Patient is a DO NOT RESUSCITATE. However they did want her to continue on dialysis. She will be discharged home now her pain has been much better. She was able to sit for hemodialysis yesterday. Current plan is for [facility] and continue dialysis. The family has  agreed that if she starts having significant pain then can be possibly transition to hospice home in the near future.             Consults  nephrology  Significant Tests:  See full reports for all details     Ct Angio Chest Pe W/cm &/or Wo Cm  Result Date: 08/25/2016 CLINICAL DATA:  Hypoxia and hypotension. EXAM: CT ANGIOGRAPHY CHEST WITH CONTRAST TECHNIQUE: Multidetector CT imaging of the chest was performed using the standard protocol during bolus administration of intravenous contrast. Multiplanar CT image reconstructions and MIPs were obtained to evaluate the vascular anatomy. CONTRAST:  75 cc Isovue 370 COMPARISON:  Chest CT 05/16/2016 FINDINGS: Chest wall: Artifact from a a left-sided permanent pacemaker. Status post left mastectomy. No definite right-sided breast masses. No supraclavicular or axillary adenopathy. Cardiovascular: The heart is enlarged but stable. No pericardial effusion. Stable tortuosity, ectasia and calcification of the thoracic aorta without obvious dissection or focal aneurysm. Stable dense 3 vessel coronary artery calcifications. Extensive reflux of contrast down the IVC and into the hepatic veins consistent with right heart failure or tricuspid regurgitation. Enlarged pulmonary arteries consistent with pulmonary hypertension. No filling defects to suggest pulmonary embolism. Mediastinum/Nodes: Suspect subcarinal and right hilar adenopathy. The esophagus is grossly normal. Lungs/Pleura: Enlarging bilateral lung lesions consistent with metastatic disease. Underlying emphysema and interstitial lung disease. Upper Abdomen: Advanced vascular disease. Left adrenal gland metastasis. Musculoskeletal: Lytic metastatic bone disease with pathologic compression fractures and a pathologic sternal fracture. Rib lesions are also noted. Progressive finding since prior study. Review of the MIP images confirms the above findings. IMPRESSION: 1. No CT findings for pulmonary embolism. 2.  Stable massive cardiac enlargement. 3. Progressive pulmonary metastatic disease with enlarging pulmonary nodules. 4. Stable underlying emphysema and interstitial lung disease. 5. Progressive lytic metastatic bone disease with pathologic thoracic spine compression fractures and a lower sternal fracture. Electronically Signed   By: Marijo Sanes M.D.   On: 08/25/2016 21:01   Nm Pet Image Restag (ps) Skull Base To Thigh  Result Date: 08/18/2016 CLINICAL DATA:  Initial Treatment strategy for multiple pulmonary nodules. History of breast cancer. EXAM: NUCLEAR MEDICINE PET SKULL BASE TO THIGH TECHNIQUE: 1118 mCi F-18 FDG was injected intravenously. Full-ring PET imaging was performed from the skull base to thigh after the radiotracer. CT data was obtained and used for attenuation correction and anatomic localization. FASTING BLOOD GLUCOSE:  Value: 76 mg/dl COMPARISON:  Chest CT dated 05/16/2016 ; prior nuclear medicine PET-CT of 10/23/2014. FINDINGS: NECK No hypermetabolic lymph nodes in the neck. CHEST Emphysema with scattered bilateral pulmonary nodules which are hypermetabolic. Index 2.1 by 1.9 cm right lower lobe pulmonary nodule on image 102/4 has a maximum SUV of 9.3 Moderate to prominent cardiomegaly with interstitial edema noted. This edema is especially prominent in the lung bases. There is some dropout of PET signal in the upper thorax involving a region primarily at about the level of the aortic arch, probably indicating some artifact. Coronary, aortic arch, and branch vessel atherosclerotic vascular disease. Pacer leads noted. ABDOMEN/PELVIS Several hypodense liver lesions are mildly hypermetabolic, compatible with malignancy. For example a 1.6 by 1.3 cm lesion in the lateral segment left hepatic lobe has a maximum SUV of 5.3, background hepatic activity is about 3.0. A right hepatic lobe hypermetabolic lesion has a maximum SUV of 4.1. There appears to be a vascular clip within or along the a 4.8 by 3.4 cm  lesion of the right medial kidney on image 134 series 4. This lesion has metabolic activity with maximum SUV approximately 2.8, renal parenchymal tissue otherwise has maximum SUV of about 2.0. Aortoiliac atherosclerotic vascular disease. Scattered calcifications along the renal hila are mostly vascular. Prominence of stool in the rectum with perirectal stranding and rectal wall thickening, mild stercoral colitis is not excluded. SKELETON Extensive osseous metastatic disease throughout the skeleton, including a primarily lytic lesion of the C2 vertebral body with maximum SUV 8.4 ; metastatic lesions of the right scapula, right temporal skull base sternum, right medial clavicle, ribs, thoracic spine, lumbar spine, sacrum, iliac bones, and bilateral ischium. Bilateral hip screws noted. Extensive bony destruction of the right acetabulum including the quadrilateral plate, anterior wall, posterior wall, acetabular roof, and a significant buttressing portion of the right iliac bone with extraosseous extension of tumor, maximum SUV in the involved portion of the right iliac bone 13.2. IMPRESSION: 1. Scattered multiple bilateral hypermetabolic pulmonary nodules with extensive osseous metastatic disease, and several early metastatic lesions in the liver. Extensive bony destructive findings in the right acetabulum and adjacent iliac bone. Possible destructive findings in the C2 vertebral body, with numerous additional bony lesions. No overt adenopathy in the chest, I favor the multifocal malignancy visible on today's exam and likely being metastatic from the patient's prior breast cancer. 2. Prominent rectal stool with perirectal stranding and rectal wall thickening, cannot exclude stercoral colitis. 3. Other imaging findings of potential clinical significance: Emphysema. Moderate to prominent cardiomegaly with interstitial edema. Coronary, aortic arch, and branch vessel atherosclerotic vascular disease. Aortoiliac  atherosclerotic vascular disease. Electronically Signed   By: Van Clines M.D.   On: 08/18/2016 11:12   Dg Chest [facility] [address]  Result Date: 08/25/2016 CLINICAL DATA:  Tightness of lung cancer on Thursday. Shortness of breath, hypoxic EXAM: PORTABLE CHEST 1 VIEW COMPARISON:  PET-CT 08/18/2016 FINDINGS: There is bilateral chronic interstitial thickening. Left apical pulmonary nodule again noted similar in appearance to the prior exam. Other bilateral pulmonary nodules seen on a recent PET-CT are not well delineated on the current exam. There is no focal consolidation. There is no pleural effusion or pneumothorax. There is stable cardiomegaly. There is a dual lead cardiac pacemaker. The osseous structures are unremarkable. IMPRESSION: 1. No acute cardiopulmonary disease. 2. Cardiomegaly. Electronically Signed   By: Kathreen Devoid   On: 08/25/2016 14:14       Today   Subjective:   Patricia Woodard  Pt feels ok no complatints  Objective:   Blood pressure (!) 163/59, pulse (!) 59, temperature 98.9 F (37.2 C), temperature source Oral, resp. rate 20, height 5\' 7"  (1.702 m), weight 132 lb 0.9 oz (59.9 kg), SpO2 100 %.  .  Intake/Output Summary (Last 24 hours) at 08/31/16 1233 Last data filed at 08/30/16 1632  Gross per 24 hour  Intake                0 ml  Output              -60 ml  Net               60 ml    Exam VITAL SIGNS: Blood pressure (!) 163/59, pulse (!) 59, temperature 98.9 F (37.2 C), temperature source Oral, resp. rate 20, height 5\' 7"  (1.702 m), weight 132 lb 0.9 oz (59.9 kg), SpO2 100 %.  GENERAL:  83 y.o.-year-old patient lying in the bed with no acute distress.  EYES: Pupils equal, round, reactive to light and accommodation. No scleral icterus. Extraocular muscles intact.  HEENT: Head atraumatic, normocephalic. Oropharynx and nasopharynx clear.  NECK:  Supple, no jugular venous distention. No thyroid enlargement, no tenderness.  LUNGS: Normal breath sounds bilaterally, no  wheezing, rales,rhonchi or crepitation. No use of accessory muscles of respiration.  CARDIOVASCULAR: S1, S2 normal. No murmurs, rubs, or gallops.  ABDOMEN: Soft, nontender, nondistended. Bowel sounds present. No organomegaly or mass.  EXTREMITIES: No pedal edema, cyanosis, or clubbing.  NEUROLOGIC: Cranial nerves II through XII are intact. Muscle strength 5/5 in all extremities. Sensation intact. Gait not checked.  PSYCHIATRIC: The patient is alert and oriented x 3.  SKIN: No obvious rash, lesion, or ulcer.   Data Review     CBC w Diff: Lab Results  Component Value Date   WBC 3.7 08/29/2016   HGB 10.4 (L) 08/29/2016   HGB 11.2 (L) 07/29/2013   HCT 31.3 (L) 08/29/2016   HCT 34.8 (L) 07/29/2013   PLT 135 (L) 08/29/2016   PLT 256 07/29/2013   LYMPHOPCT 25 08/25/2016   LYMPHOPCT 26.1 07/29/2013   MONOPCT 24 08/25/2016   MONOPCT 12.8 07/29/2013   EOSPCT 1 08/25/2016   EOSPCT 4.0 07/29/2013   BASOPCT 1 08/25/2016   BASOPCT 0.3 07/29/2013   CMP: Lab Results  Component Value Date   NA 134 (L) 08/29/2016   NA 139 10/14/2014   K 4.0 08/29/2016   K 4.7 10/14/2014   CL 96 (L) 08/29/2016   CL 101 10/14/2014   CO2 28 08/29/2016   CO2 25 10/14/2014   BUN 33 (H) 08/29/2016   BUN 33 (H) 10/14/2014   CREATININE 5.94 (H) 08/29/2016   CREATININE 6.05 (H) 10/14/2014   PROT 7.0 08/25/2016  PROT 8.7 (H) 10/14/2014   ALBUMIN 2.2 (L) 08/29/2016   ALBUMIN 3.4 (L) 10/14/2014   BILITOT 0.7 08/25/2016   BILITOT 0.7 10/14/2014   ALKPHOS 141 (H) 08/25/2016   ALKPHOS 174 (H) 10/14/2014   AST 93 (H) 08/25/2016   AST 20 10/14/2014   ALT 22 08/25/2016   ALT 12 (L) 10/14/2014  .  Micro Results Recent Results (from the past 240 hour(s))  Culture, sputum-assessment     Status: None   Collection Time: 08/27/16 12:45 PM  Result Value Ref Range Status   Specimen Description SPUTUM  Final   Special Requests NONE  Final   Sputum evaluation   Final    Sputum specimen not acceptable for  testing.  Please recollect.     Report Status 08/27/2016 FINAL  Final        Code Status Orders        Start     Ordered   08/27/16 1819  Do not attempt resuscitation (DNR)  Continuous    Question Answer Comment  In the event of cardiac or respiratory ARREST Do not call a "code blue"   In the event of cardiac or respiratory ARREST Do not perform Intubation, CPR, defibrillation or ACLS   In the event of cardiac or respiratory ARREST Use medication by any route, position, wound care, and other measures to relive pain and suffering. May use oxygen, suction and manual treatment of airway obstruction as needed for comfort.   Comments RN may pronounce      08/27/16 1818    Code Status History    Date Active Date Inactive Code Status Order ID Comments User Context   08/27/2016  1:47 PM 08/27/2016  6:17 PM Full Code [id number]  Vaughan Basta, MD Inpatient   08/27/2016  8:42 AM 08/27/2016  1:47 PM DNR [id number]  Vaughan Basta, MD Inpatient   08/25/2016  5:28 PM 08/27/2016  8:42 AM Full Code [id number]  Theodoro Grist, MD Inpatient   05/14/2016  2:41 PM 05/17/2016  7:46 PM Full Code [id number]  Laverle Hobby, MD Inpatient   05/13/2016  8:25 PM 05/14/2016  2:41 PM Full Code [id number]  Demetrios Loll, MD ED   05/13/2016  7:03 PM 05/13/2016  8:25 PM Full Code [id number]  Earnestine Leys, MD ED   11/04/2014  2:43 PM 11/04/2014  6:09 PM Full Code [id number]  Isaias Cowman, MD Inpatient    Advance Directive Documentation   Flowsheet Row Most Recent Value  Type of Advance Directive  Healthcare Power of Attorney  Pre-existing out of facility DNR order (yellow form or pink MOST form)  No data  "MOST" Form in Place?  No data          Follow-up Information    hd as scheduled Follow up.           Discharge Medications   Allergies as of 08/31/2016      Reactions   Penicillins Itching   Ivp Dye [iodinated Diagnostic Agents] Rash   Shellfish Allergy Rash      Medication List     STOP taking these medications   losartan 100 MG tablet Commonly known as:  COZAAR     TAKE these medications   acetaminophen 325 MG tablet Commonly known as:  TYLENOL Take 2 tablets (650 mg total) by mouth every 6 (six) hours as needed for mild pain (or Fever >/= 101).   amiodarone 200 MG tablet Commonly known as:  PACERONE Take 200 mg by mouth daily.  amLODipine 10 MG tablet Commonly known as:  NORVASC Take 10 mg by mouth daily.   anastrozole 1 MG tablet Commonly known as:  ARIMIDEX Take 1 mg by mouth daily.   aspirin EC 81 MG tablet Take 81 mg by mouth daily.   bisacodyl 10 MG suppository Commonly known as:  DULCOLAX Place 1 suppository (10 mg total) rectally daily as needed for moderate constipation.   carvedilol 6.25 MG tablet Commonly known as:  COREG Take 6.25 mg by mouth 2 (two) times daily with a meal.   cloNIDine 0.1 MG tablet Commonly known as:  CATAPRES Take 0.1 mg by mouth 3 (three) times daily. As directed   clopidogrel 75 MG tablet Commonly known as:  PLAVIX Take 75 mg by mouth daily.   dexamethasone 4 MG tablet Commonly known as:  DECADRON Take 1 tablet (4 mg total) by mouth every 8 (eight) hours.   diphenhydrAMINE 25 MG tablet Commonly known as:  BENADRYL Take 25 mg by mouth every 6 (six) hours as needed.   EDARBI 80 MG Tabs Generic drug:  Azilsartan Medoxomil Take 80 mg by mouth daily.   esomeprazole 20 MG capsule Commonly known as:  NEXIUM Take 20 mg by mouth daily at 12 noon.   ferrous sulfate 325 (65 FE) MG tablet Take 1 tablet (325 mg total) by mouth daily with breakfast.   furosemide 80 MG tablet Commonly known as:  LASIX TAKE ONE TABLET BY MOUTH EVERY DAY   gabapentin 300 MG capsule Commonly known as:  NEURONTIN Take 300 mg by mouth 3 (three) times daily.   hydrALAZINE 100 MG tablet Commonly known as:  APRESOLINE Take 100 mg by mouth 2 (two) times daily.   HYDROcodone-acetaminophen 5-325 MG tablet Commonly known as:   NORCO/VICODIN Take 1 tablet by mouth every 4 (four) hours.   isosorbide mononitrate 30 MG 24 hr tablet Commonly known as:  IMDUR Take 30 mg by mouth daily.   levothyroxine 100 MCG tablet Commonly known as:  SYNTHROID, LEVOTHROID Take 100 mcg by mouth daily before breakfast.   lovastatin 40 MG tablet Commonly known as:  MEVACOR TAKE ONE TABLET BY MOUTH AT BEDTIME   mirtazapine 15 MG tablet Commonly known as:  REMERON Take 15 mg by mouth at bedtime.   multivitamin Tabs tablet Take 1 tablet by mouth daily. 0.8mg    PROSOL 20 % Soln   senna 8.6 MG Tabs tablet Commonly known as:  SENOKOT Take 1 tablet (8.6 mg total) by mouth 2 (two) times daily.   SENSIPAR 30 MG tablet Generic drug:  cinacalcet TAKE ONE TABLET BY MOUTH AT DINNER. What changed:  See the new instructions.   sevelamer carbonate 800 MG tablet Commonly known as:  RENVELA Take 800 mg by mouth 3 (three) times daily with meals.            Durable Medical Equipment        Start     Ordered   08/31/16 1056  For home use only DME Other see comment  Once    Comments:  Hoyer lift   08/31/16 1055   08/28/16 1147  For home use only DME oxygen  Once    Question Answer Comment  Mode or (Route) Nasal cannula   Liters per Minute 2   Frequency Continuous (stationary and portable oxygen unit needed)   Oxygen conserving device Yes   Oxygen delivery system Gas      08/28/16 1147   08/28/16 1146  For home use only DME  Hospital bed  Once    Question Answer Comment  Patient has (list medical condition): Breast cancer with metastatis to spines, causing pain and requiring certain positioning to ease the pain.   The above medical condition requires: Patient requires the ability to reposition frequently   Head must be elevated greater than: 45 degrees   Bed type Semi-electric   Support Surface: Gel Overlay      08/28/16 1146         Total Time in preparing paper work, data evaluation and todays exam - 35  minutes  Dustin Flock M.D on 08/31/2016 at 12:33 PM  [facility]   Office  [phone]

## 2016-08-31 NOTE — Care Management (Addendum)
Ms. Ige has renal/lung cancer which requires to be positioned in ways not feasible with a normal bed. Head must be elevated at least 30 degrees or she will in respiratory distress.  Shelbie Ammons RN MSN [facility] Care Management

## 2016-08-31 NOTE — Progress Notes (Signed)
Patient was a PENDING home Palliative referral with an appointment scheduled for 3/12, but was hospitalized prior to the appointment. Hospital Palliative team and [facility] made aware. Flo Shanks RN, BSN, [facility] [place], [place] Liaison

## 2016-08-31 NOTE — Progress Notes (Signed)
New referral for [facility], PT, aide and Palliative NP received from [place]. Patient has a known history of ESRD, on MWF dialysis as well as stage IV breast Ca, with metastasis to the bone, lung and liver. Palliative Medicine has been involved and patient is a DNR. She would like to continue dialysis at this time. Writer met with patient and her daughters, [name] and Precious, [facility] information and contact number given. Family and [facility] aware that [facility] admission wil not be able to take place until next week. Family has requested Home oxygen, a hospital bed and hoyer lift. DME ordered by [facility], oxygen to be delivered this evening, patient will discharge home via EMS when oxygen is in place. Other DME to be delivered tomorrow. Patient information faxed to referral.Thank you for the opportunity to be involved in the care of this patient and her family. Flo Shanks RN, BSN, [facility] Liaison [phone] c

## 2016-08-31 NOTE — Plan of Care (Signed)
Problem: Nutrition Goal: Nutritional status is improving Monitor and assess patient for malnutrition (ex- brittle hair, bruises, dry skin, pale skin and conjunctiva, muscle wasting, smooth red tongue, and disorientation). Collaborate with interdisciplinary team and initiate plan and interventions as ordered.  Monitor patient's weight and dietary intake as ordered or per policy. Utilize nutrition screening tool and intervene per policy. Determine patient's food preferences and provide high-protein, high-caloric foods as appropriate.   Outcome: Progressing Tolerated 75% of dinner and had applesauce and jello for snacks.

## 2016-08-31 NOTE — Progress Notes (Signed)
Initial Nutrition Assessment  DOCUMENTATION CODES:   Severe malnutrition in context of chronic illness  INTERVENTION:  Recommend Magic cup TID with meals, each supplement provides 290 kcal and 9 grams of protein.  Continue Rena-vit daily.  NUTRITION DIAGNOSIS:   Malnutrition (Severe) related to chronic illness (ESRD on HD, metastatic breast cancer) as evidenced by severe depletion of body fat, severe depletion of muscle mass.  GOAL:   Patient will meet greater than or equal to 90% of their needs  MONITOR:   PO intake, Labs, Weight trends, Skin, I & O's  REASON FOR ASSESSMENT:   Low Braden    ASSESSMENT:   83 year old female with PMHx of HTN, ESRD on HD, MI, breast cancer with metastases to liver and bone, stroke 2009, recent hip fracture s/p ORIF 05/14/2016 admitted for shortness of breath, confusion and found to have acute respiratory failure with hypoxia, acute on chronic diastolic heart failure.   -PMT following patient. She will be discharging home for EOL care with hospice service and will not receive any further workup or treatment for her cancer diagnosis.  Spoke with patient at bedside but she did not seem to be a good historian. Patient reports that her appetite is good. She denies N/V or abdominal pain. Also denies any difficulty chewing/swallowing but patient is on Dysphagia 3 diet with Nectar-thick liquids. No family at bedside to discuss with.  Patient reports she is unsure of her UBW or if she has lost any weight. Per chart patient has lost 6 lbs (4.5% body weight) over 3 months, which is not significant for time frame.  Medications reviewed and include: dexamethasone 4 mg Q8hrs, ferrous sulfate 325 mg daily, Lasix 80 mg daily, levothyroxine, Remeron, Rena-vit daily, pantoprazole, Renvela.  Labs reviewed: Sodium 134, Chloride 96, BUN 33, Creatinine 5.94. Potassium and Phosphorus WNL.  Nutrition-Focused physical exam completed. Findings are severe fat depletion,  severe muscle depletion, and no edema.   Discussed with RN.   Diet Order:  DIET DYS 3 Room service appropriate? Yes; Fluid consistency: Nectar Thick  Skin:  Wound (see comment) (Stg II buttocks, unstageable heels, foot)  Last BM:  08/30/2016  Height:   Ht Readings from Last 1 Encounters:  08/25/16 5\' 7"  (1.702 m)    Weight:   Wt Readings from Last 1 Encounters:  08/04/16 126 lb 1.7 oz (57.2 kg)    Ideal Body Weight:  61.4 kg  BMI:  Body mass index is 20.68 kg/m.  Estimated Nutritional Needs:   Kcal:  1800-2100  Protein:  85-95 grams  Fluid:  1.5 L/day  EDUCATION NEEDS:   Education needs no appropriate at this time  Willey Blade, MS, RD, LDN Pager: [phone] After Hours Pager: [phone]

## 2016-08-31 NOTE — Progress Notes (Signed)
Corrected wound folder and deleted extra folder.  Pt with mixed deep tissue injury and stage II pressure injury to coccyx.  Area is 6x6 with purple, maroon borders and friable, broken skin showing pink tissue.  No drainage. Cleansed with wound cleanser, covered with foam and off loaded.  Pt is turn q 2. Heels with pink foam.  Off loading boots in place. Dorna Bloom RN

## 2016-08-31 NOTE — Discharge Instructions (Signed)
Shortness of Breath, Adult Shortness of breath is when a person has trouble breathing enough air, or when a person feels like she or he is having trouble breathing in enough air. Shortness of breath could be a sign of medical problem. Follow these instructions at home: Pay attention to any changes in your symptoms. Take these actions to help with your condition:  Do not smoke. Smoking is a common cause of shortness of breath. If you smoke and you need help quitting, ask your health care provider.  Avoid things that can irritate your airways, such as:  Mold.  Dust.  Air pollution.  Chemical fumes.  Things that can cause allergy symptoms (allergens), if you have allergies.  Keep your living space clean and free of mold and dust.  Rest as needed. Slowly return to your usual activities.  Take over-the-counter and prescription medicines, including oxygen and inhaled medicines, only as told by your health care provider.  Keep all follow-up visits as told by your health care provider. This is important. Contact a health care provider if:  Your condition does not improve as soon as expected.  You have a hard time doing your normal activities, even after you rest.  You have new symptoms. Get help right away if:  Your shortness of breath gets worse.  You have shortness of breath when you are resting.  You feel light-headed or you faint.  You have a cough that is not controlled with medicines.  You cough up blood.  You have pain with breathing.  You have pain in your chest, arms, shoulders, or abdomen.  You have a fever.  You cannot walk up stairs or exercise the way that you normally do. This information is not intended to replace advice given to you by your health care provider. Make sure you discuss any questions you have with your health care provider. Document Released: 03/01/2001 Document Revised: 12/26/2015 Document Reviewed: 11/12/2015 Elsevier Interactive  Patient Education  2017 [place] [facility] [place]:  Renal diet, carbohydrate diet  DISCHARGE CONDITION:  Stable  ACTIVITY:  Activity as tolerated  OXYGEN:  Home Oxygen: Yes.     Oxygen Delivery: 3 liters/min via Patient connected to nasal cannula oxygen  DISCHARGE LOCATION:  home    ADDITIONAL DISCHARGE INSTRUCTION:   If you experience worsening of your admission symptoms, develop shortness of breath, life threatening emergency, suicidal or homicidal thoughts you must seek medical attention immediately by calling 911 or calling your MD immediately  if symptoms less severe.  You Must read complete instructions/literature along with all the possible adverse reactions/side effects for all the Medicines you take and that have been prescribed to you. Take any new Medicines after you have completely understood and accpet all the possible adverse reactions/side effects.   Please note  You were cared for by a hospitalist during your hospital stay. If you have any questions about your discharge medications or the care you received while you were in the hospital after you are discharged, you can call the unit and asked to speak with the hospitalist on call if the hospitalist that took care of you is not available. Once you are discharged, your primary care physician will handle any further medical issues. Please note that NO REFILLS for any discharge medications will be authorized once you are discharged, as it is imperative that you return to your primary care physician (or establish a relationship with a primary care physician if  you do not have one) for your aftercare needs so that they can reassess your need for medications and monitor your lab values.

## 2016-08-31 NOTE — Care Management (Signed)
Suzan Nailer RN NP for Palliative Care spoke with daughter, [name] this morning. Would like to take their mother home with Hospice services in place. [place]. Will update Flo Shanks, RN representative for [facility]. Requested Hospital bed.  Shelbie Ammons RN [facility] CCM Care management

## 2016-08-31 NOTE — Care Management (Signed)
Spoke to Trex at [facility]. States that they could deliver oxygen supplies to the home tonight. Home oxygen order and qualifying saturations faxed to [phone]. Faxed face sheet, hoyer lift, and hospital beds request.  Arabia Nylund, daughter, updated.  Rowe Robert, RN updated. Shelbie Ammons RN MSN CCM Care Management

## 2016-08-31 NOTE — Progress Notes (Addendum)
Daily Progress Note   Patient Name: Patricia Woodard       Date: 08/31/2016 DOB: [birth year]  Age: 83 y.o. MRN#: [id number] Attending Physician: Dustin Flock, MD Primary Care Physician: Madelyn Brunner, MD Admit Date: 08/25/2016  Reason for Consultation/Follow-up: Establishing goals of care and Psychosocial/spiritual support  Subjective:  -visited with patient at bedside, awakens easily, no verbal complaints  -call placed to daughter Patricia Woodard regarding f/u to [place] meeting with the family.  Patient did tolerate dialysis yesterday sitting in the chair.  This solidifies family's decision to support patient through continued dialysis as long as she is able, and take her home for care with hospice support  The family reiterates that once Patricia Woodard cannot tolerate OP dialysis, all will accept this as her body's message that it "is time and end of life is near"  -we discussed again her life limiting cancer diagnosis and that patient is high risk for decompensation and importance of continued family discussion regarding EOL wishes  - family is open to hospice services  Length of Stay: 0  Current Medications: Scheduled Meds:  . amiodarone  100 mg Oral Daily  . anastrozole  1 mg Oral Daily  . aspirin EC  81 mg Oral Daily  . cinacalcet  30 mg Oral Q supper  . clopidogrel  75 mg Oral Daily  . dexamethasone  4 mg Oral Q8H  . ferrous sulfate  325 mg Oral Q breakfast  . furosemide  80 mg Oral Daily  . gabapentin  100 mg Oral TID  . heparin  5,000 Units Subcutaneous Q8H  . HYDROcodone-acetaminophen  1 tablet Oral Q4H  . irbesartan  300 mg Oral Daily  . isosorbide mononitrate  30 mg Oral Daily  . levofloxacin  250 mg Oral Q48H  . levothyroxine  100 mcg Oral QAC breakfast  . mirtazapine   15 mg Oral QHS  . multivitamin  1 tablet Oral Daily  . pantoprazole  40 mg Oral Daily  . pravastatin  40 mg Oral q1800  . sevelamer carbonate  800 mg Oral TID WC  . sodium chloride flush  3 mL Intravenous Q12H    Continuous Infusions:   PRN Meds: acetaminophen **OR** acetaminophen, diphenhydrAMINE, ondansetron **OR** ondansetron (ZOFRAN) IV, sodium chloride flush  Physical Exam  Constitutional: She is oriented  to person, place, and time. She appears cachectic. She is sleeping. She appears ill.  awakens easily  HENT:  Mouth/Throat: Oropharynx is clear and moist.  Cardiovascular: Normal rate, regular rhythm and normal heart sounds.   Pulmonary/Chest: She has decreased breath sounds in the right lower field and the left lower field.  Neurological: She is alert and oriented to person, place, and time.  Skin: Skin is warm and dry.  -noted sacral wound per EMR            Vital Signs: BP (!) 159/61 (BP Location: Right Arm)   Pulse 60   Temp 99.1 F (37.3 C) (Oral)   Resp 18   Ht 5\' 7"  (1.702 m)   Wt 59.9 kg (132 lb 0.9 oz)   SpO2 95%   BMI 20.68 kg/m  SpO2: SpO2: 95 % O2 Device: O2 Device: Nasal Cannula O2 Flow Rate: O2 Flow Rate (L/min): 1 L/min  Intake/output summary:  Intake/Output Summary (Last 24 hours) at 08/31/16 0847 Last data filed at 08/30/16 1632  Gross per 24 hour  Intake                0 ml  Output              -60 ml  Net               60 ml   LBM: Last BM Date: 08/30/16 Baseline Weight: Weight: 56.7 kg (125 lb) Most recent weight: Weight:  (uta- pt in recliner)       Palliative Assessment/Data:  30 % at best    Flowsheet Rows   Flowsheet Row Most Recent Value  Intake Tab  Referral Department  Hospitalist  Unit at Time of Referral  Oncology Unit  Palliative Care Primary Diagnosis  Cancer  Date Notified  08/25/16  Palliative Care Type  New Palliative care  Reason for referral  Clarify Goals of Care  Date of Admission  08/25/16  Date first seen  by Palliative Care  08/30/16  # of days Palliative referral response time  5 Day(s)  # of days IP prior to Palliative referral  0  Clinical Assessment  Psychosocial & Spiritual Assessment  Palliative Care Outcomes      Patient Active Problem List   Diagnosis Date Noted  . DNR (do not resuscitate) 08/30/2016  . Palliative care by specialist 08/30/2016  . Metastatic cancer ([place])   . Acute respiratory failure with hypoxia ([place]) 08/25/2016  . Acute on chronic diastolic CHF (congestive heart failure) ([place]) 08/25/2016  . End stage renal disease ([place]) 08/25/2016  . Generalized weakness 08/25/2016  . Hypokalemia 08/25/2016  . Pressure injury of skin 08/25/2016  . Goals of care, counseling/discussion 08/19/2016  . Macrocytic anemia 08/05/2016  . Malignant neoplasm of left breast in female, estrogen receptor negative ([place]) 08/05/2016  . S/P ORIF (open reduction internal fixation) fracture 05/17/2016  . Hypotension 05/17/2016  . ESRD on dialysis ([place]) 05/17/2016  . Chronic atrial fibrillation ([place]) 05/17/2016  . Diastolic CHF, chronic ([place]) 05/17/2016  . Hyponatremia 05/17/2016  . Essential hypertension 05/17/2016  . Multiple lung nodules on CT 05/17/2016  . Respiratory failure, acute ([place]) 05/14/2016  . Closed right hip fracture ([place]) 05/13/2016    Palliative Care Assessment & Plan   Patient Profile: 83 y.o. female  admitted on 08/25/2016 with  a known history of End-stage renal disease, on dialysis, paroxysmal atrial fibrillation, diastolic CHF (ICD) chronic, h/o breast and renal cancer now with  metastases to  lung and liver and bone, hypertension, coronary artery disease, hypertension, peripheral vascular disease, severe tricuspid regurgitation, who presents to the hospital with complaints of hypoxia and hypotension.   Per oncology notes: recent PET scan showed multiple bone metastases as well as liver metastases   Given her multiple comorbidities including end-stage renal disease as  well as advanced heart disease and chronic lung disease she is not a candidate for any aggressive chemotherapy.  According to the patient's family, patient was discharged from skilled nursing facility due to poor progression, she's been managed at home by her family last week. "it has not been easy".    Home health nurse came to visit her today, she noted her blood pressure being somewhat low, her oxygen levels were also low in 80s. She was brought to emergency room for further evaluation and treatment.She has a sacral pressure wound  Patient has had continued physical, fucntional and cognitive decline.  Oncology is not recommending systemic treatement.   Family face advanced directive decisions and anticipatory care needs   Assessment:   Patient with limited prognosis, less than 6 months,  2/2 to metastatic cancer and multiple co-morbid ites.  Likely much less given her multiply co-morbid ites and rapid continued physical and functional decline.  Recommendations/Plan:  Patient and her family wish to continue dialysis long as she is able   Family understand they are responsible for transportation to dialysis  No further workup or treatment for her cancer diagnosis  Home for EOL care with hospice services under her cancer diagnosis   Goals of Care and Additional Recommendations:  Limitations on Scope of Treatment: Avoid Hospitalization, No Artificial Feeding, No Chemotherapy and No Radiation  Code Status:    Code Status Orders        Start     Ordered   08/27/16 1819  Do not attempt resuscitation (DNR)  Continuous    Question Answer Comment  In the event of cardiac or respiratory ARREST Do not call a "code blue"   In the event of cardiac or respiratory ARREST Do not perform Intubation, CPR, defibrillation or ACLS   In the event of cardiac or respiratory ARREST Use medication by any route, position, wound care, and other measures to relive pain and suffering. May use  oxygen, suction and manual treatment of airway obstruction as needed for comfort.   Comments RN may pronounce      08/27/16 1818    Code Status History    Date Active Date Inactive Code Status Order ID Comments User Context   08/27/2016  1:47 PM 08/27/2016  6:17 PM Full Code [id number]  Vaughan Basta, MD Inpatient   08/27/2016  8:42 AM 08/27/2016  1:47 PM DNR [id number]  Vaughan Basta, MD Inpatient   08/25/2016  5:28 PM 08/27/2016  8:42 AM Full Code [id number]  Theodoro Grist, MD Inpatient   05/14/2016  2:41 PM 05/17/2016  7:46 PM Full Code [id number]  Laverle Hobby, MD Inpatient   05/13/2016  8:25 PM 05/14/2016  2:41 PM Full Code [id number]  Demetrios Loll, MD ED   05/13/2016  7:03 PM 05/13/2016  8:25 PM Full Code [id number]  Earnestine Leys, MD ED   11/04/2014  2:43 PM 11/04/2014  6:09 PM Full Code [id number]  Isaias Cowman, MD Inpatient    Advance Directive Documentation   Flowsheet Row Most Recent Value  Type of Advance Directive  Healthcare Power of Attorney  Pre-existing out of facility DNR order (yellow form or pink MOST form)  No data  "  MOST" Form in Place?  No data       Prognosis:   < 6 months  Discharge Planning:  Home with Hospice  Care plan was discussed with Dr Posey Pronto and Jeannie Fend [facility]  Thank you for allowing the Palliative Medicine Team to assist in the care of this patient.   Time In: 0800 Time Out: 0835 Total Time 35 min Prolonged Time Billed  no       Greater than 50%  of this time was spent counseling and coordinating care related to the above assessment and plan.  Wadie Lessen, NP  Please contact Palliative Medicine Team phone at [phone] for questions and concerns.

## 2016-08-31 NOTE — Care Management (Addendum)
[facility] unable to take Patricia Woodard. Discussed [facility] in the home with daughter, [name].   Agrees with [facility] in the home.  Will call  [place] for hospital bed, hoyer lift. Will see if [facility] can deliver oxygen to hospital today. Hospital bed, hoyer life, oxygen saturations, face sheet faxed to [facility]. Shelbie Ammons RN MSN CCM Care Management

## 2016-08-31 NOTE — Care Management (Signed)
[facility] unable to take case. Referral to [facility]. Spoke with [name] [phone]). States she will get in touch with daughter, Patricia Woodard, to discuss discharge plans. [facility], home oxygen and lift.  Transportation will be arranged per [employer] unit. Shelbie Ammons RN MSN CCM Care Management

## 2016-08-31 NOTE — Progress Notes (Signed)
Subjective:  Patient was able to perform dialysis seated in a chair for 3.5 hours yesterday. Therefore we will continue the patient on outpatient dialysis for now as per her wishes. She has underlying metastatic cancer.  Objective:  Vital signs in last 24 hours:  Temp:  [97.6 F (36.4 C)-99.1 F (37.3 C)] 97.8 F (36.6 C) (03/14 1359) Pulse Rate:  [59-61] 59 (03/14 1359) Resp:  [13-20] 20 (03/14 1359) BP: (133-180)/(52-71) 133/55 (03/14 1359) SpO2:  [95 %-100 %] 100 % (03/14 1359)  Weight change:  Filed Weights   08/28/16 1117 08/29/16 1439  Weight: 59.9 kg (132 lb 0.9 oz) 59.9 kg (132 lb 0.9 oz)    Intake/Output:    Intake/Output Summary (Last 24 hours) at 08/31/16 1610 Last data filed at 08/31/16 1431  Gross per 24 hour  Intake              120 ml  Output              -60 ml  Net              180 ml     Physical Exam: General: No acute distress, laying in the bed  HEENT anicteric  Neck supple  Pulm/lungs CTABnormal breathing effort  CVS/Heart Irregular, no rub, systolic murmur present  Abdomen:  Soft, nontender, nondistended  Extremities: Trace edema   Neurologic: Alert, oriented to person and space  Skin: No acute rashes          Basic Metabolic Panel:   Recent Labs Lab 08/25/16 1432 08/26/16 0531 08/29/16 1450 08/30/16 1413  NA 137 133* 134*  --   K 3.3* 3.6 4.0  --   CL 100* 98* 96*  --   CO2 26 22 28   --   GLUCOSE 96 143* 75  --   BUN 30* 40* 33*  --   CREATININE 4.81* 5.53* 5.94*  --   CALCIUM 9.2 8.9 7.8*  --   PHOS  --   --  3.8  3.7 3.6     CBC:  Recent Labs Lab 08/25/16 1432 08/26/16 0531 08/29/16 1450 08/29/16 2024  WBC 4.4 3.4* 3.8 3.7  NEUTROABS 2.2  --   --   --   HGB 9.8* 9.7* 9.2* 10.4*  HCT 30.3* 30.1* 28.0* 31.3*  MCV 102.6* 101.8* 101.0* 101.4*  PLT 147* 152 141* 135*      Microbiology:  Recent Results (from the past 720 hour(s))  Culture, sputum-assessment     Status: None   Collection Time: 08/27/16  12:45 PM  Result Value Ref Range Status   Specimen Description SPUTUM  Final   Special Requests NONE  Final   Sputum evaluation   Final    Sputum specimen not acceptable for testing.  Please recollect.     Report Status 08/27/2016 FINAL  Final    Coagulation Studies: No results for input(s): LABPROT, INR in the last 72 hours.  Urinalysis: No results for input(s): COLORURINE, LABSPEC, PHURINE, GLUCOSEU, HGBUR, BILIRUBINUR, KETONESUR, PROTEINUR, UROBILINOGEN, NITRITE, LEUKOCYTESUR in the last 72 hours.  Invalid input(s): APPERANCEUR    Imaging: No results found.   Medications:    . amiodarone  100 mg Oral Daily  . anastrozole  1 mg Oral Daily  . aspirin EC  81 mg Oral Daily  . cinacalcet  30 mg Oral Q supper  . clopidogrel  75 mg Oral Daily  . dexamethasone  4 mg Oral Q8H  . ferrous sulfate  325 mg Oral  Q breakfast  . furosemide  80 mg Oral Daily  . gabapentin  100 mg Oral TID  . heparin  5,000 Units Subcutaneous Q8H  . HYDROcodone-acetaminophen  1 tablet Oral Q4H  . irbesartan  300 mg Oral Daily  . isosorbide mononitrate  30 mg Oral Daily  . levothyroxine  100 mcg Oral QAC breakfast  . mirtazapine  15 mg Oral QHS  . multivitamin  1 tablet Oral Daily  . pantoprazole  40 mg Oral Daily  . pravastatin  40 mg Oral q1800  . sevelamer carbonate  800 mg Oral TID WC  . sodium chloride flush  3 mL Intravenous Q12H   acetaminophen **OR** acetaminophen, diphenhydrAMINE, ondansetron **OR** ondansetron (ZOFRAN) IV, sodium chloride flush  Assessment/ Plan:  83 y.o. female with hypertension, MI requiring hypothermia treatment, congestive heart failure with EF , dilated cardiomyopathy, ESRD, hyperlipidemia, peripheral vascular disease, renal cell carcinoma s/p radiation therapy (RCC followed at [facility]), rt carotid stent 08/2012, Left hip fracture repair (june 2014),  h/o breast cancer. Rt hip fracture s/p ORIF 05/14/2016 [facility] dialysis/ MWF-2. Patricia Woodard  1.  ESRD -  patient was able to sit up for 3.5 hours for dialysis yesterday.  It is her wish to continue hemodialysis despite her diagnosis of metastatic cancer.  Transportation may pose a challenge however the family is in the process of obtaining a Hoyer lift for the home and they already have a hoyer pad for the patient.  She will enroll in hospice under her metastatic cancer diagnosis.   2. AOCKD Continue to hold Epogen for now given metastatic cancer diagnosis.  3. Metastatic breast cancer - recently evaluated bt Dr Janese Banks at [facility] PET CT and CT chest with iv contrast this admission shows metastatic disease to her bones, liver Palliative consult has been obtained and it appears that the patient continues to desire hemodialysis at this time.   4. Hypertension - Continue Irbesartan.  5.  Secondary hyperparathyroidism. Ok to continue renvela at this time for phos control.     LOS: 0 Patricia Woodard 3/14/20184:10 PM

## 2016-08-31 NOTE — Progress Notes (Signed)
SATURATION QUALIFICATIONS: (This note is used to comply with regulatory documentation for home oxygen)  Patient Saturations on Room Air at Rest = 86 %  Patient Saturations on Room Air while Ambulating =0  Patient Saturations on0Liters of oxygen while Ambulating = 0%  Please briefly explain why patient needs home oxygen:  pts sats drop to 86% on r/a   sats  Increase to  98% on 2 liters

## 2016-09-01 LAB — CBC
HEMATOCRIT: 29.7 % — AB (ref 35.0–47.0)
Hemoglobin: 9.8 g/dL — ABNORMAL LOW (ref 12.0–16.0)
MCH: 33.4 pg (ref 26.0–34.0)
MCHC: 32.9 g/dL (ref 32.0–36.0)
MCV: 101.6 fL — AB (ref 80.0–100.0)
Platelets: 159 10*3/uL (ref 150–440)
RBC: 2.92 MIL/uL — ABNORMAL LOW (ref 3.80–5.20)
RDW: 15.9 % — ABNORMAL HIGH (ref 11.5–14.5)
WBC: 4.6 10*3/uL (ref 3.6–11.0)

## 2016-09-01 MED ORDER — GABAPENTIN 100 MG PO CAPS: 100.0000 mg | ORAL_CAPSULE | Freq: Three times a day (TID) | ORAL | 0 refills | Status: AC

## 2016-09-01 NOTE — Progress Notes (Signed)
[facility] [facility] [place] NAME: Patricia Woodard    MR#:  [id number]  DATE OF BIRTH:  1931/06/28  CHIEF COMPLAINT:   Chief Complaint  Patient presents with  . Shortness of Breath   Pain under control   REVIEW OF SYSTEMS:   Review of Systems  Unable to perform ROS: Acuity of condition   Appears confused.But more awake  DRUG ALLERGIES:   Allergies  Allergen Reactions  . Penicillins Itching  . Ivp Dye [Iodinated Diagnostic Agents] Rash  . Shellfish Allergy Rash    VITALS:  Blood pressure (!) 169/58, pulse 60, temperature 98 F (36.7 C), temperature source Oral, resp. rate 17, height 5\' 7"  (1.702 m), weight 132 lb 0.9 oz (59.9 kg), SpO2 98 %.  PHYSICAL EXAMINATION:  GENERAL:  83 y.o.-year-old patient lying in the bed w,cachectic. EYES: Pupils equal, round, reactive to light .no scleral icterus. HEENT: Head atraumatic, normocephalic. Oropharynx and nasopharynx clear.  NECK:  Supple, no jugular venous distention. No thyroid enlargement, no tenderness.  LUNGS: decreased  breath sounds bilaterally. Crackles in the left base, CARDIOVASCULAR: S1, S2 normal. No murmurs, rubs, or gallops.  ABDOMEN: Soft, nontender, nondistended. Bowel sounds present. No organomegaly or mass.  EXTREMITIES: No pedal edema, cyanosis, or clubbing.  NEUROLOGIC: pt is alert, follows commands, moves all limbs slowly, generalized weak. PSYCHIATRIC:  Awake, Not anxious or depressed  LABORATORY PANEL:   CBC  Recent Labs Lab 09/01/16 0455  WBC 4.6  HGB 9.8*  HCT 29.7*  PLT 159   ------------------------------------------------------------------------------------------------------------------  Chemistries   Recent Labs Lab 08/25/16 1432  08/29/16 1450  NA 137  < > 134*  K 3.3*  < > 4.0  CL 100*  < > 96*  CO2 26  < > 28  GLUCOSE 96  < > 75  BUN 30*  < > 33*  CREATININE 4.81*  < > 5.94*  CALCIUM 9.2  < > 7.8*  AST 93*  --   --   ALT 22  --   --    ALKPHOS 141*  --   --   BILITOT 0.7  --   --   < > = values in this interval not displayed. ------------------------------------------------------------------------------------------------------------------  Cardiac Enzymes  Recent Labs Lab 08/25/16 1432  TROPONINI 0.24*   ------------------------------------------------------------------------------------------------------------------  RADIOLOGY:  No results found.  EKG:   Orders placed or performed during the hospital encounter of 08/25/16  . ED EKG  . ED EKG    ASSESSMENT AND PLAN:   #1. Acute respiratory failure with hypoxia, acute on chronic diastolic heart failure: Improved with hemodialysis # 2. Hypotension;: Resolved  #3 bradycardia: Rate improved   #4. History of right-sided breast cancer with bone metastases, liver metastases as per oncology note prognosis poor,Pain improved continue Decadron  Plan for home with continued dialysis #5 stage II sacral decubiti, stage I left heel ulcer: Seen by wound care, continue wound care as ordered.. #6 anorexia due to cancer: #7 metabolic encephalopathy  Due to pain and medication improved #8 history of PAD, , atrial fibrillation, coronary artery disease: Patient is on aspirin, Plavix, Imdur, statins,  Coreg on  hold because of bradycardia. #9. UTI- status post treatment    Please note plan was for discharge to home however home health agency was not able to arrange a hospital bed or oxygen therefore he was delayed until tomorrow   All the records are reviewed and case discussed with Care Management/Social Workerr. Management plans discussed  with the patient, family and they are in agreement.  CODE STATUS: DNR  TOTAL TIME TAKING CARE OF THIS PATIENT:25 minutes.  POSSIBLE D/C IN 1-2 DAYS, DEPENDING ON CLINICAL CONDITION.  Dustin Flock M.D on 09/01/2016 at 12:33 PM  Between 7am to 6pm - Pager - [phone]  After 6pm go to [email or web address] - password [email or web address]  [facility]  Office  [phone]  CC: Primary care physician; Madelyn Brunner, MD   Note: This dictation was prepared with Dragon dictation along with smaller phrase technology. Any transcriptional errors that result from this process are unintentional.

## 2016-09-01 NOTE — Progress Notes (Signed)
Subjective:  Patient seen at bedside. She will be due for hemodialysis again tomorrow. Her disposition is pending at the moment. Appears to be in good spirits.  Objective:  Vital signs in last 24 hours:  Temp:  [97.9 F (36.6 C)-98 F (36.7 C)] 97.9 F (36.6 C) (03/15 1317) Pulse Rate:  [60] 60 (03/15 1317) Resp:  [17-18] 18 (03/15 1317) BP: (164-169)/(58-59) 164/59 (03/15 1317) SpO2:  [98 %-100 %] 100 % (03/15 1317)  Weight change:  Filed Weights   08/28/16 1117 08/29/16 1439  Weight: 59.9 kg (132 lb 0.9 oz) 59.9 kg (132 lb 0.9 oz)    Intake/Output:    Intake/Output Summary (Last 24 hours) at 09/01/16 1455 Last data filed at 09/01/16 0800  Gross per 24 hour  Intake              480 ml  Output                0 ml  Net              480 ml     Physical Exam: General: No acute distress, laying in the bed  HEENT anicteric  Neck supple  Pulm/lungs CTAB normal breathing effort  CVS/Heart Irregular, no rub, systolic murmur present  Abdomen:  Soft, nontender, nondistended  Extremities: Trace edema   Neurologic: Alert, oriented to person and space  Skin: No acute rashes          Basic Metabolic Panel:   Recent Labs Lab 08/25/16 1432 08/26/16 0531 08/29/16 1450 08/30/16 1413  NA 137 133* 134*  --   K 3.3* 3.6 4.0  --   CL 100* 98* 96*  --   CO2 26 22 28   --   GLUCOSE 96 143* 75  --   BUN 30* 40* 33*  --   CREATININE 4.81* 5.53* 5.94*  --   CALCIUM 9.2 8.9 7.8*  --   PHOS  --   --  3.8  3.7 3.6     CBC:  Recent Labs Lab 08/25/16 1432 08/26/16 0531 08/29/16 1450 08/29/16 2024 09/01/16 0455  WBC 4.4 3.4* 3.8 3.7 4.6  NEUTROABS 2.2  --   --   --   --   HGB 9.8* 9.7* 9.2* 10.4* 9.8*  HCT 30.3* 30.1* 28.0* 31.3* 29.7*  MCV 102.6* 101.8* 101.0* 101.4* 101.6*  PLT 147* 152 141* 135* 159      Microbiology:  Recent Results (from the past 720 hour(s))  Culture, sputum-assessment     Status: None   Collection Time: 08/27/16 12:45 PM  Result  Value Ref Range Status   Specimen Description SPUTUM  Final   Special Requests NONE  Final   Sputum evaluation   Final    Sputum specimen not acceptable for testing.  Please recollect.     Report Status 08/27/2016 FINAL  Final    Coagulation Studies: No results for input(s): LABPROT, INR in the last 72 hours.  Urinalysis: No results for input(s): COLORURINE, LABSPEC, PHURINE, GLUCOSEU, HGBUR, BILIRUBINUR, KETONESUR, PROTEINUR, UROBILINOGEN, NITRITE, LEUKOCYTESUR in the last 72 hours.  Invalid input(s): APPERANCEUR    Imaging: No results found.   Medications:    . amiodarone  100 mg Oral Daily  . anastrozole  1 mg Oral Daily  . aspirin EC  81 mg Oral Daily  . cinacalcet  30 mg Oral Q supper  . clopidogrel  75 mg Oral Daily  . dexamethasone  4 mg Oral Q8H  . ferrous sulfate  325 mg Oral Q breakfast  . furosemide  80 mg Oral Daily  . gabapentin  100 mg Oral TID  . heparin  5,000 Units Subcutaneous Q8H  . HYDROcodone-acetaminophen  1 tablet Oral Q4H  . irbesartan  300 mg Oral Daily  . isosorbide mononitrate  30 mg Oral Daily  . levothyroxine  100 mcg Oral QAC breakfast  . mirtazapine  15 mg Oral QHS  . multivitamin  1 tablet Oral Daily  . pantoprazole  40 mg Oral Daily  . pravastatin  40 mg Oral q1800  . sevelamer carbonate  800 mg Oral TID WC  . sodium chloride flush  3 mL Intravenous Q12H   acetaminophen **OR** acetaminophen, diphenhydrAMINE, ondansetron **OR** ondansetron (ZOFRAN) IV, sodium chloride flush  Assessment/ Plan:  83 y.o. female with hypertension, MI requiring hypothermia treatment, congestive heart failure with EF , dilated cardiomyopathy, ESRD, hyperlipidemia, peripheral vascular disease, renal cell carcinoma s/p radiation therapy (RCC followed at [facility]), rt carotid stent 08/2012, Left hip fracture repair (june 2014),  h/o breast cancer. Rt hip fracture s/p ORIF 05/14/2016 [name] [address] [facility] dialysis/ MWF-2. Patricia Woodard  1.  ESRD - patient had hemodialysis  on Monday and Tuesday of this past week.  She was able to tolerate dialysis seated up in a chair.  We will plan for hemodialysis again tomorrow.   2. AOCKD Hemoglobin currently 9.8.  Patient not receiving Epogengiven her malignancy.  3. Metastatic breast cancer - recently evaluated bt Dr Janese Banks at [facility] PET CT and CT chest with iv contrast this admission shows metastatic disease to her bones, liver Palliative consult has been obtained and it appears that the patient continues to desire hemodialysis at this time.   4. Hypertension - blood pressure has ranged between 133/55-164/59 today. Continue Irbesartan.  5.  Secondary hyperparathyroidism. Phosphorus was 3.6 at last check.  Continue to monitor serum phosphorous periodically.  Continue Renvela.   LOS: 0 [name] 3/15/20182:55 PM

## 2016-09-01 NOTE — Progress Notes (Signed)
Notified by patient's daughter [name] that the DME has been delivered. EMS notified for transport. Staff RN Gerald Stabs notified.  Flo Shanks RN, BSN, [place]

## 2016-09-01 NOTE — Progress Notes (Signed)
Changed dressings on both heals

## 2016-09-01 NOTE — Discharge Summary (Addendum)
Patricia Woodard at [facility], 83 y.o., DOB [birth year], MRN [id number]. Admission date: 08/25/2016 Discharge Date 09/01/2016 Primary MD Madelyn Brunner, MD Admitting Physician Theodoro Grist, MD  Admission Diagnosis  SOB (shortness of breath) [R06.02]  Discharge Diagnosis   Active Problems:   Acute respiratory failure with hypoxia (HCC)   Acute on chronic diastolic CHF (congestive heart failure) (HCC)   End stage renal disease (HCC)   Generalized weakness   Hypokalemia   Pressure injury of skin   DNR (do not resuscitate)   Palliative care by specialist   Metastatic cancer ([place])  bradycardia Metabolic encephalopathy Peripheral arterial disease Atrial fibrillation UTI status post treatment       Patricia Woodard  is a 83 y.o. 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 According to the patient's family, patient was discharged from skilled nursing facility due to poor progression, she's been managed at home by her family. Patient was admitted for further evaluation and therapy. She was thought to have acute on chronic diastolic CHF. Patient has metastatic breast cancer to the liver and bone. With severe pain. Patient  Was unable to lay flat for HD due to metastatic pain. Her prognosis is very poor. Discussion with family were held. Patient is a DO NOT RESUSCITATE. However they did want her to continue on dialysis. She will be discharged home now her pain has been much better. She was able to sit for hemodialysis yesterday. Current plan is for her to go home with [facility] and continue dialysis. The family has  agreed that if she starts having significant pain then can be possibly transition to hospice home in the near future.  Please note due to patient being on continuing dialysis hospice will not be following her at home home health agency with [facility] follow the patient at home.              Consults  nephrology  Significant Tests:  See full reports for all details     Ct Angio Chest Pe W/cm &/or Wo Cm  Result Date: 08/25/2016 CLINICAL DATA:  Hypoxia and hypotension. EXAM: CT ANGIOGRAPHY CHEST WITH CONTRAST TECHNIQUE: Multidetector CT imaging of the chest was performed using the standard protocol during bolus administration of intravenous contrast. Multiplanar CT image reconstructions and MIPs were obtained to evaluate the vascular anatomy. CONTRAST:  75 cc Isovue 370 COMPARISON:  Chest CT 05/16/2016 FINDINGS: Chest wall: Artifact from a a left-sided permanent pacemaker. Status post left mastectomy. No definite right-sided breast masses. No supraclavicular or axillary adenopathy. Cardiovascular: The heart is enlarged but stable. No pericardial effusion. Stable tortuosity, ectasia and calcification of the thoracic aorta without obvious dissection or focal aneurysm. Stable dense 3 vessel coronary artery calcifications. Extensive reflux of contrast down the IVC and into the hepatic veins consistent with right heart failure or tricuspid regurgitation. Enlarged pulmonary arteries consistent with pulmonary hypertension. No filling defects to suggest pulmonary embolism. Mediastinum/Nodes: Suspect subcarinal and right hilar adenopathy. The esophagus is grossly normal. Lungs/Pleura: Enlarging bilateral lung lesions consistent with metastatic disease. Underlying emphysema and interstitial lung disease. Upper Abdomen: Advanced vascular disease. Left adrenal gland metastasis. Musculoskeletal: Lytic metastatic bone disease with pathologic compression fractures and a pathologic sternal  fracture. Rib lesions are  also noted. Progressive finding since prior study. Review of the MIP images confirms the above findings. IMPRESSION: 1. No CT findings for pulmonary embolism. 2. Stable massive cardiac enlargement. 3. Progressive pulmonary metastatic disease with enlarging pulmonary nodules. 4. Stable underlying emphysema and interstitial lung disease. 5. Progressive lytic metastatic bone disease with pathologic thoracic spine compression fractures and a lower sternal fracture. Electronically Signed   By: Marijo Sanes M.D.   On: 08/25/2016 21:01   Nm Pet Image Restag (ps) Skull Base To Thigh  Result Date: 08/18/2016 CLINICAL DATA:  Initial Treatment strategy for multiple pulmonary nodules. History of breast cancer. EXAM: NUCLEAR MEDICINE PET SKULL BASE TO THIGH TECHNIQUE: 1118 mCi F-18 FDG was injected intravenously. Full-ring PET imaging was performed from the skull base to thigh after the radiotracer. CT data was obtained and used for attenuation correction and anatomic localization. FASTING BLOOD GLUCOSE:  Value: 76 mg/dl COMPARISON:  Chest CT dated 05/16/2016 ; prior nuclear medicine PET-CT of 10/23/2014. FINDINGS: NECK No hypermetabolic lymph nodes in the neck. CHEST Emphysema with scattered bilateral pulmonary nodules which are hypermetabolic. Index 2.1 by 1.9 cm right lower lobe pulmonary nodule on image 102/4 has a maximum SUV of 9.3 Moderate to prominent cardiomegaly with interstitial edema noted. This edema is especially prominent in the lung bases. There is some dropout of PET signal in the upper thorax involving a region primarily at about the level of the aortic arch, probably indicating some artifact. Coronary, aortic arch, and branch vessel atherosclerotic vascular disease. Pacer leads noted. ABDOMEN/PELVIS Several hypodense liver lesions are mildly hypermetabolic, compatible with malignancy. For example a 1.6 by 1.3 cm lesion in the lateral segment left hepatic lobe has a maximum SUV of 5.3, background hepatic  activity is about 3.0. A right hepatic lobe hypermetabolic lesion has a maximum SUV of 4.1. There appears to be a vascular clip within or along the a 4.8 by 3.4 cm lesion of the right medial kidney on image 134 series 4. This lesion has metabolic activity with maximum SUV approximately 2.8, renal parenchymal tissue otherwise has maximum SUV of about 2.0. Aortoiliac atherosclerotic vascular disease. Scattered calcifications along the renal hila are mostly vascular. Prominence of stool in the rectum with perirectal stranding and rectal wall thickening, mild stercoral colitis is not excluded. SKELETON Extensive osseous metastatic disease throughout the skeleton, including a primarily lytic lesion of the C2 vertebral body with maximum SUV 8.4 ; metastatic lesions of the right scapula, right temporal skull base sternum, right medial clavicle, ribs, thoracic spine, lumbar spine, sacrum, iliac bones, and bilateral ischium. Bilateral hip screws noted. Extensive bony destruction of the right acetabulum including the quadrilateral plate, anterior wall, posterior wall, acetabular roof, and a significant buttressing portion of the right iliac bone with extraosseous extension of tumor, maximum SUV in the involved portion of the right iliac bone 13.2. IMPRESSION: 1. Scattered multiple bilateral hypermetabolic pulmonary nodules with extensive osseous metastatic disease, and several early metastatic lesions in the liver. Extensive bony destructive findings in the right acetabulum and adjacent iliac bone. Possible destructive findings in the C2 vertebral body, with numerous additional bony lesions. No overt adenopathy in the chest, I favor the multifocal malignancy visible on today's exam and likely being metastatic from the patient's prior breast cancer. 2. Prominent rectal stool with perirectal stranding and rectal wall thickening, cannot exclude stercoral colitis. 3. Other imaging findings of potential clinical significance:  Emphysema. Moderate to prominent cardiomegaly with interstitial edema. Coronary, aortic arch, and  branch vessel atherosclerotic vascular disease. Aortoiliac atherosclerotic vascular disease. Electronically Signed   By: Van Clines M.D.   On: 08/18/2016 11:12   Dg Chest Port 1 View  Result Date: 08/25/2016 CLINICAL DATA:  Tightness of lung cancer on Thursday. Shortness of breath, hypoxic EXAM: PORTABLE CHEST 1 VIEW COMPARISON:  PET-CT 08/18/2016 FINDINGS: There is bilateral chronic interstitial thickening. Left apical pulmonary nodule again noted similar in appearance to the prior exam. Other bilateral pulmonary nodules seen on a recent PET-CT are not well delineated on the current exam. There is no focal consolidation. There is no pleural effusion or pneumothorax. There is stable cardiomegaly. There is a dual lead cardiac pacemaker. The osseous structures are unremarkable. IMPRESSION: 1. No acute cardiopulmonary disease. 2. Cardiomegaly. Electronically Signed   By: Kathreen Devoid   On: 08/25/2016 14:14       Today   Subjective:   Ardis Hughs  Pt feels ok   Objective:   Blood pressure (!) 164/59, pulse 60, temperature 97.9 F (36.6 C), temperature source Oral, resp. rate 18, height 5\' 7"  (1.702 m), weight 132 lb 0.9 oz (59.9 kg), SpO2 100 %.  .  Intake/Output Summary (Last 24 hours) at 09/01/16 1704 Last data filed at 09/01/16 1200  Gross per 24 hour  Intake              720 ml  Output                0 ml  Net              720 ml    Exam VITAL SIGNS: Blood pressure (!) 164/59, pulse 60, temperature 97.9 F (36.6 C), temperature source Oral, resp. rate 18, height 5\' 7"  (1.702 m), weight 132 lb 0.9 oz (59.9 kg), SpO2 100 %.  GENERAL:  83 y.o.-year-old patient lying in the bed with no acute distress.  EYES: Pupils equal, round, reactive to light and accommodation. No scleral icterus. Extraocular muscles intact.  HEENT: Head atraumatic, normocephalic. Oropharynx and nasopharynx  clear.  NECK:  Supple, no jugular venous distention. No thyroid enlargement, no tenderness.  LUNGS: Normal breath sounds bilaterally, no wheezing, rales,rhonchi or crepitation. No use of accessory muscles of respiration.  CARDIOVASCULAR: S1, S2 normal. No murmurs, rubs, or gallops.  ABDOMEN: Soft, nontender, nondistended. Bowel sounds present. No organomegaly or mass.  EXTREMITIES: No pedal edema, cyanosis, or clubbing.  NEUROLOGIC: Cranial nerves II through XII are intact. Muscle strength 5/5 in all extremities. Sensation intact. Gait not checked.  PSYCHIATRIC: The patient is alert and oriented x 3.  SKIN: No obvious rash, lesion, or ulcer.   Data Review     CBC w Diff:  Lab Results  Component Value Date   WBC 4.6 09/01/2016   HGB 9.8 (L) 09/01/2016   HGB 11.2 (L) 07/29/2013   HCT 29.7 (L) 09/01/2016   HCT 34.8 (L) 07/29/2013   PLT 159 09/01/2016   PLT 256 07/29/2013   LYMPHOPCT 25 08/25/2016   LYMPHOPCT 26.1 07/29/2013   MONOPCT 24 08/25/2016   MONOPCT 12.8 07/29/2013   EOSPCT 1 08/25/2016   EOSPCT 4.0 07/29/2013   BASOPCT 1 08/25/2016   BASOPCT 0.3 07/29/2013   CMP:  Lab Results  Component Value Date   NA 134 (L) 08/29/2016   NA 139 10/14/2014   K 4.0 08/29/2016   K 4.7 10/14/2014   CL 96 (L) 08/29/2016   CL 101 10/14/2014   CO2 28 08/29/2016   CO2 25 10/14/2014  BUN 33 (H) 08/29/2016   BUN 33 (H) 10/14/2014   CREATININE 5.94 (H) 08/29/2016   CREATININE 6.05 (H) 10/14/2014   PROT 7.0 08/25/2016   PROT 8.7 (H) 10/14/2014   ALBUMIN 2.2 (L) 08/29/2016   ALBUMIN 3.4 (L) 10/14/2014   BILITOT 0.7 08/25/2016   BILITOT 0.7 10/14/2014   ALKPHOS 141 (H) 08/25/2016   ALKPHOS 174 (H) 10/14/2014   AST 93 (H) 08/25/2016   AST 20 10/14/2014   ALT 22 08/25/2016   ALT 12 (L) 10/14/2014  .  Micro Results Recent Results (from the past 240 hour(s))  Culture, sputum-assessment     Status: None   Collection Time: 08/27/16 12:45 PM  Result Value Ref Range Status    Specimen Description SPUTUM  Final   Special Requests NONE  Final   Sputum evaluation   Final    Sputum specimen not acceptable for testing.  Please recollect.     Report Status 08/27/2016 FINAL  Final        Code Status Orders        Start     Ordered   08/27/16 1819  Do not attempt resuscitation (DNR)  Continuous    Question Answer Comment  In the event of cardiac or respiratory ARREST Do not call a "code blue"   In the event of cardiac or respiratory ARREST Do not perform Intubation, CPR, defibrillation or ACLS   In the event of cardiac or respiratory ARREST Use medication by any route, position, wound care, and other measures to relive pain and suffering. May use oxygen, suction and manual treatment of airway obstruction as needed for comfort.   Comments RN may pronounce      08/27/16 1818    Code Status History    Date Active Date Inactive Code Status Order ID Comments User Context   08/27/2016  1:47 PM 08/27/2016  6:17 PM Full Code [id number]  Vaughan Basta, MD Inpatient   08/27/2016  8:42 AM 08/27/2016  1:47 PM DNR [id number]  Vaughan Basta, MD Inpatient   08/25/2016  5:28 PM 08/27/2016  8:42 AM Full Code [id number]  Theodoro Grist, MD Inpatient   05/14/2016  2:41 PM 05/17/2016  7:46 PM Full Code [id number]  Laverle Hobby, MD Inpatient   05/13/2016  8:25 PM 05/14/2016  2:41 PM Full Code [id number]  Demetrios Loll, MD ED   05/13/2016  7:03 PM 05/13/2016  8:25 PM Full Code [id number]  Earnestine Leys, MD ED   11/04/2014  2:43 PM 11/04/2014  6:09 PM Full Code [id number]  Isaias Cowman, MD Inpatient    Advance Directive Documentation   Flowsheet Row Most Recent Value  Type of Advance Directive  Healthcare Power of Attorney  Pre-existing out of facility DNR order (yellow form or pink MOST form)  No data  "MOST" Form in Place?  No data          Follow-up Information    hd as scheduled Follow up.           Discharge Medications   Allergies as of 09/01/2016       Reactions   Penicillins Itching   Ivp Dye [iodinated Diagnostic Agents] Rash   Shellfish Allergy Rash      Medication List    STOP taking these medications   losartan 100 MG tablet Commonly known as:  COZAAR     TAKE these medications   acetaminophen 325 MG tablet Commonly known as:  TYLENOL Take 2 tablets (650 mg total) by mouth every 6 (  six) hours as needed for mild pain (or Fever >/= 101).   amiodarone 200 MG tablet Commonly known as:  PACERONE Take 200 mg by mouth daily.   amLODipine 10 MG tablet Commonly known as:  NORVASC Take 10 mg by mouth daily.   anastrozole 1 MG tablet Commonly known as:  ARIMIDEX Take 1 mg by mouth daily.   aspirin EC 81 MG tablet Take 81 mg by mouth daily.   bisacodyl 10 MG suppository Commonly known as:  DULCOLAX Place 1 suppository (10 mg total) rectally daily as needed for moderate constipation.   carvedilol 6.25 MG tablet Commonly known as:  COREG Take 6.25 mg by mouth 2 (two) times daily with a meal.   cloNIDine 0.1 MG tablet Commonly known as:  CATAPRES Take 0.1 mg by mouth 3 (three) times daily. As directed   clopidogrel 75 MG tablet Commonly known as:  PLAVIX Take 75 mg by mouth daily.   dexamethasone 4 MG tablet Commonly known as:  DECADRON Take 1 tablet (4 mg total) by mouth every 8 (eight) hours.   diphenhydrAMINE 25 MG tablet Commonly known as:  BENADRYL Take 25 mg by mouth every 6 (six) hours as needed.   EDARBI 80 MG Tabs Generic drug:  Azilsartan Medoxomil Take 80 mg by mouth daily.   esomeprazole 20 MG capsule Commonly known as:  NEXIUM Take 20 mg by mouth daily at 12 noon.   ferrous sulfate 325 (65 FE) MG tablet Take 1 tablet (325 mg total) by mouth daily with breakfast.   furosemide 80 MG tablet Commonly known as:  LASIX TAKE ONE TABLET BY MOUTH EVERY DAY   gabapentin 100 MG capsule Commonly known as:  NEURONTIN Take 1 capsule (100 mg total) by mouth 3 (three) times daily. What  changed:  medication strength  how much to take   hydrALAZINE 100 MG tablet Commonly known as:  APRESOLINE Take 100 mg by mouth 2 (two) times daily.   HYDROcodone-acetaminophen 5-325 MG tablet Commonly known as:  NORCO/VICODIN Take 1 tablet by mouth every 4 (four) hours.   isosorbide mononitrate 30 MG 24 hr tablet Commonly known as:  IMDUR Take 30 mg by mouth daily.   levothyroxine 100 MCG tablet Commonly known as:  SYNTHROID, LEVOTHROID Take 100 mcg by mouth daily before breakfast.   lovastatin 40 MG tablet Commonly known as:  MEVACOR TAKE ONE TABLET BY MOUTH AT BEDTIME   mirtazapine 15 MG tablet Commonly known as:  REMERON Take 15 mg by mouth at bedtime.   multivitamin Tabs tablet Take 1 tablet by mouth daily. 0.8mg    PROSOL 20 % Soln   senna 8.6 MG Tabs tablet Commonly known as:  SENOKOT Take 1 tablet (8.6 mg total) by mouth 2 (two) times daily.   SENSIPAR 30 MG tablet Generic drug:  cinacalcet TAKE ONE TABLET BY MOUTH AT DINNER. What changed:  See the new instructions.   sevelamer carbonate 800 MG tablet Commonly known as:  RENVELA Take 800 mg by mouth 3 (three) times daily with meals.            Durable Medical Equipment        Start     Ordered   08/31/16 1056  For home use only DME Other see comment  Once    Comments:  Harrel Lemon lift   08/31/16 1055   08/28/16 1147  For home use only DME oxygen  Once    Question Answer Comment  Mode or (Route) Nasal cannula  Liters per Minute 2   Frequency Continuous (stationary and portable oxygen unit needed)   Oxygen conserving device Yes   Oxygen delivery system Gas      08/28/16 1147   08/28/16 1146  For home use only DME Hospital bed  Once    Question Answer Comment  Patient has (list medical condition): Breast cancer with metastatis to spines, causing pain and requiring certain positioning to ease the pain.   The above medical condition requires: Patient requires the ability to reposition frequently    Head must be elevated greater than: 45 degrees   Bed type Semi-electric   Support Surface: Gel Overlay      08/28/16 1146         Total Time in preparing paper work, data evaluation and todays exam - 35 minutes  Dustin Flock M.D on 09/01/2016 at 5:04 [facility]  [place]  [phone]

## 2016-09-01 NOTE — Care Management (Signed)
Medical necessity completed. Verified address with daughter, Betul Brisky. Hospital bed to be delivered today between 1 and 5 pm. I ask the dgt to call the primary nurse when she was ready for EMS to be called to pick up her mom. Primary nurse updated.

## 2016-09-09 ENCOUNTER — Other Ambulatory Visit: Payer: Self-pay | Admitting: *Deleted

## 2016-09-09 ENCOUNTER — Telehealth: Payer: Self-pay | Admitting: *Deleted

## 2016-09-09 DIAGNOSIS — S31000A Unspecified open wound of lower back and pelvis without penetration into retroperitoneum, initial encounter: Secondary | ICD-10-CM

## 2016-09-09 NOTE — Telephone Encounter (Signed)
Family requesting referral to wound care clinic. Please advise

## 2016-09-09 NOTE — Telephone Encounter (Signed)
Called Patricia Woodard and she states that the pt has sacral mass and bil. Heel wounds.  I asked Dr. Mike Gip and she was agreeable to the ref. And I entered one in.

## 2016-09-09 NOTE — Progress Notes (Signed)
amb ref made to wound care center for sacral wound and heels wound per request of palliative care

## 2016-09-14 ENCOUNTER — Inpatient Hospital Stay
Admission: EM | Admit: 2016-09-14 | Discharge: 2016-09-18 | DRG: 871 | Disposition: E | Payer: Medicare Other | Attending: Internal Medicine | Admitting: Internal Medicine

## 2016-09-14 ENCOUNTER — Emergency Department: Payer: Medicare Other

## 2016-09-14 DIAGNOSIS — I482 Chronic atrial fibrillation: Secondary | ICD-10-CM | POA: Diagnosis present

## 2016-09-14 DIAGNOSIS — R0902 Hypoxemia: Secondary | ICD-10-CM | POA: Diagnosis present

## 2016-09-14 DIAGNOSIS — Z7982 Long term (current) use of aspirin: Secondary | ICD-10-CM

## 2016-09-14 DIAGNOSIS — N186 End stage renal disease: Secondary | ICD-10-CM | POA: Diagnosis present

## 2016-09-14 DIAGNOSIS — L03317 Cellulitis of buttock: Secondary | ICD-10-CM | POA: Diagnosis present

## 2016-09-14 DIAGNOSIS — Z9049 Acquired absence of other specified parts of digestive tract: Secondary | ICD-10-CM | POA: Diagnosis not present

## 2016-09-14 DIAGNOSIS — I132 Hypertensive heart and chronic kidney disease with heart failure and with stage 5 chronic kidney disease, or end stage renal disease: Secondary | ICD-10-CM | POA: Diagnosis present

## 2016-09-14 DIAGNOSIS — Z7902 Long term (current) use of antithrombotics/antiplatelets: Secondary | ICD-10-CM

## 2016-09-14 DIAGNOSIS — I5032 Chronic diastolic (congestive) heart failure: Secondary | ICD-10-CM | POA: Diagnosis present

## 2016-09-14 DIAGNOSIS — F0280 Dementia in other diseases classified elsewhere without behavioral disturbance: Secondary | ICD-10-CM | POA: Diagnosis present

## 2016-09-14 DIAGNOSIS — E872 Acidosis: Secondary | ICD-10-CM | POA: Diagnosis present

## 2016-09-14 DIAGNOSIS — Z880 Allergy status to penicillin: Secondary | ICD-10-CM

## 2016-09-14 DIAGNOSIS — Z992 Dependence on renal dialysis: Secondary | ICD-10-CM | POA: Diagnosis not present

## 2016-09-14 DIAGNOSIS — G9340 Encephalopathy, unspecified: Secondary | ICD-10-CM | POA: Diagnosis present

## 2016-09-14 DIAGNOSIS — C7951 Secondary malignant neoplasm of bone: Secondary | ICD-10-CM | POA: Diagnosis present

## 2016-09-14 DIAGNOSIS — Z66 Do not resuscitate: Secondary | ICD-10-CM | POA: Diagnosis present

## 2016-09-14 DIAGNOSIS — Z171 Estrogen receptor negative status [ER-]: Secondary | ICD-10-CM | POA: Diagnosis not present

## 2016-09-14 DIAGNOSIS — D638 Anemia in other chronic diseases classified elsewhere: Secondary | ICD-10-CM | POA: Diagnosis present

## 2016-09-14 DIAGNOSIS — Z87891 Personal history of nicotine dependence: Secondary | ICD-10-CM | POA: Diagnosis not present

## 2016-09-14 DIAGNOSIS — Z8673 Personal history of transient ischemic attack (TIA), and cerebral infarction without residual deficits: Secondary | ICD-10-CM

## 2016-09-14 DIAGNOSIS — I248 Other forms of acute ischemic heart disease: Secondary | ICD-10-CM | POA: Diagnosis present

## 2016-09-14 DIAGNOSIS — R6521 Severe sepsis with septic shock: Secondary | ICD-10-CM | POA: Diagnosis present

## 2016-09-14 DIAGNOSIS — C50912 Malignant neoplasm of unspecified site of left female breast: Secondary | ICD-10-CM | POA: Diagnosis present

## 2016-09-14 DIAGNOSIS — C7800 Secondary malignant neoplasm of unspecified lung: Secondary | ICD-10-CM | POA: Diagnosis present

## 2016-09-14 DIAGNOSIS — I959 Hypotension, unspecified: Secondary | ICD-10-CM | POA: Diagnosis present

## 2016-09-14 DIAGNOSIS — I252 Old myocardial infarction: Secondary | ICD-10-CM

## 2016-09-14 DIAGNOSIS — Z91041 Radiographic dye allergy status: Secondary | ICD-10-CM

## 2016-09-14 DIAGNOSIS — A419 Sepsis, unspecified organism: Principal | ICD-10-CM | POA: Diagnosis present

## 2016-09-14 DIAGNOSIS — Z85528 Personal history of other malignant neoplasm of kidney: Secondary | ICD-10-CM

## 2016-09-14 DIAGNOSIS — L89150 Pressure ulcer of sacral region, unstageable: Secondary | ICD-10-CM | POA: Diagnosis present

## 2016-09-14 DIAGNOSIS — Z91013 Allergy to seafood: Secondary | ICD-10-CM

## 2016-09-14 LAB — CBC WITH DIFFERENTIAL/PLATELET
BASOS ABS: 0 10*3/uL (ref 0–0.1)
Basophils Relative: 0 %
EOS PCT: 0 %
Eosinophils Absolute: 0 10*3/uL (ref 0–0.7)
HCT: 24.4 % — ABNORMAL LOW (ref 35.0–47.0)
HEMOGLOBIN: 7.7 g/dL — AB (ref 12.0–16.0)
LYMPHS PCT: 6 %
Lymphs Abs: 0.5 10*3/uL — ABNORMAL LOW (ref 1.0–3.6)
MCH: 32.8 pg (ref 26.0–34.0)
MCHC: 31.5 g/dL — ABNORMAL LOW (ref 32.0–36.0)
MCV: 104.1 fL — ABNORMAL HIGH (ref 80.0–100.0)
Monocytes Absolute: 0.6 10*3/uL (ref 0.2–0.9)
Monocytes Relative: 7 %
NEUTROS PCT: 87 %
Neutro Abs: 7.6 10*3/uL — ABNORMAL HIGH (ref 1.4–6.5)
PLATELETS: 151 10*3/uL (ref 150–440)
RBC: 2.35 MIL/uL — AB (ref 3.80–5.20)
RDW: 17.3 % — ABNORMAL HIGH (ref 11.5–14.5)
WBC: 8.7 10*3/uL (ref 3.6–11.0)

## 2016-09-14 LAB — COMPREHENSIVE METABOLIC PANEL
ALK PHOS: 101 U/L (ref 38–126)
ALT: 12 U/L — AB (ref 14–54)
AST: 37 U/L (ref 15–41)
Albumin: 2.1 g/dL — ABNORMAL LOW (ref 3.5–5.0)
Anion gap: 17 — ABNORMAL HIGH (ref 5–15)
BUN: 40 mg/dL — AB (ref 6–20)
CHLORIDE: 98 mmol/L — AB (ref 101–111)
CO2: 21 mmol/L — AB (ref 22–32)
CREATININE: 4.57 mg/dL — AB (ref 0.44–1.00)
Calcium: 9.2 mg/dL (ref 8.9–10.3)
GFR calc Af Amer: 9 mL/min — ABNORMAL LOW (ref >60)
GFR, EST NON AFRICAN AMERICAN: 8 mL/min — AB (ref >60)
Glucose, Bld: 184 mg/dL — ABNORMAL HIGH (ref 65–99)
Potassium: 4.6 mmol/L (ref 3.5–5.1)
Sodium: 136 mmol/L (ref 135–145)
Total Bilirubin: 0.8 mg/dL (ref 0.3–1.2)
Total Protein: 6.1 g/dL — ABNORMAL LOW (ref 6.5–8.1)

## 2016-09-14 LAB — PROCALCITONIN: Procalcitonin: 3.24 ng/mL

## 2016-09-14 LAB — LACTIC ACID, PLASMA
LACTIC ACID, VENOUS: 4.9 mmol/L — AB (ref 0.5–1.9)
Lactic Acid, Venous: 6.5 mmol/L (ref 0.5–1.9)
Lactic Acid, Venous: 4.4 mmol/L (ref 0.5–1.9)
Lactic Acid, Venous: 7 mmol/L (ref 0.5–1.9)

## 2016-09-14 LAB — TROPONIN I: Troponin I: 0.58 ng/mL (ref <0.03)

## 2016-09-14 LAB — GLUCOSE, CAPILLARY: GLUCOSE-CAPILLARY: 161 mg/dL — AB (ref 65–99)

## 2016-09-14 MED ORDER — ACETAMINOPHEN 325 MG PO TABS: 650.0000 mg | ORAL_TABLET | Freq: Four times a day (QID) | ORAL | Status: DC | PRN

## 2016-09-14 MED ORDER — ACETAMINOPHEN 650 MG RE SUPP: 650.0000 mg | Freq: Four times a day (QID) | RECTAL | Status: DC | PRN

## 2016-09-14 MED ORDER — ALBUTEROL SULFATE (2.5 MG/3ML) 0.083% IN NEBU: 2.5000 mg | INHALATION_SOLUTION | RESPIRATORY_TRACT | Status: DC | PRN

## 2016-09-14 MED ORDER — SODIUM CHLORIDE 0.9 % IV BOLUS (SEPSIS)
500.0000 mL | Freq: Once | INTRAVENOUS | Status: AC
  Administered 2016-09-14: 500 mL via INTRAVENOUS

## 2016-09-14 MED ORDER — DEXTROSE 5 % IV SOLN
500.0000 mg | Freq: Two times a day (BID) | INTRAVENOUS | Status: DC
  Filled 2016-09-14 (×2): qty 0.5

## 2016-09-14 MED ORDER — SODIUM CHLORIDE 0.9 % IV BOLUS (SEPSIS)
1000.0000 mL | Freq: Once | INTRAVENOUS | Status: AC
  Administered 2016-09-14: 1000 mL via INTRAVENOUS

## 2016-09-14 MED ORDER — HEPARIN SODIUM (PORCINE) 5000 UNIT/ML IJ SOLN: 5000.0000 [IU] | Freq: Three times a day (TID) | INTRAMUSCULAR | Status: DC

## 2016-09-14 MED ORDER — SODIUM CHLORIDE 0.9 % IV SOLN
0.0000 ug/min | INTRAVENOUS | Status: DC
  Administered 2016-09-14: 20 ug/min via INTRAVENOUS
  Filled 2016-09-14 (×2): qty 1

## 2016-09-14 MED ORDER — POLYETHYLENE GLYCOL 3350 17 G PO PACK: 17.0000 g | PACK | Freq: Every day | ORAL | Status: DC | PRN

## 2016-09-14 MED ORDER — VANCOMYCIN HCL IN DEXTROSE 1-5 GM/200ML-% IV SOLN
1000.0000 mg | Freq: Once | INTRAVENOUS | Status: AC
  Administered 2016-09-14: 1000 mg via INTRAVENOUS
  Filled 2016-09-14: qty 200

## 2016-09-14 MED ORDER — OXYCODONE HCL 5 MG PO TABS
5.0000 mg | ORAL_TABLET | ORAL | Status: DC | PRN
  Administered 2016-09-14: 5 mg via ORAL
  Filled 2016-09-14: qty 1

## 2016-09-14 MED ORDER — BISACODYL 10 MG RE SUPP
10.0000 mg | Freq: Every day | RECTAL | Status: DC | PRN
  Filled 2016-09-14: qty 1

## 2016-09-14 MED ORDER — DEXTROSE 5 % IV SOLN
2.0000 g | Freq: Once | INTRAVENOUS | Status: AC
  Administered 2016-09-14: 2 g via INTRAVENOUS
  Filled 2016-09-14: qty 2

## 2016-09-14 MED ORDER — LEVOFLOXACIN IN D5W 750 MG/150ML IV SOLN
750.0000 mg | Freq: Once | INTRAVENOUS | Status: AC
  Administered 2016-09-14: 750 mg via INTRAVENOUS
  Filled 2016-09-14: qty 150

## 2016-09-14 MED ORDER — ONDANSETRON HCL 4 MG/2ML IJ SOLN: 4.0000 mg | Freq: Four times a day (QID) | INTRAMUSCULAR | Status: DC | PRN

## 2016-09-14 MED ORDER — VANCOMYCIN HCL 500 MG IV SOLR: 500.0000 mg | INTRAVENOUS | Status: DC

## 2016-09-14 MED ORDER — ONDANSETRON HCL 4 MG PO TABS: 4.0000 mg | ORAL_TABLET | Freq: Four times a day (QID) | ORAL | Status: DC | PRN

## 2016-09-14 MED ORDER — SODIUM CHLORIDE 0.9 % IV SOLN
0.0000 ug/min | INTRAVENOUS | Status: DC
  Administered 2016-09-14: 100 ug/min via INTRAVENOUS
  Filled 2016-09-14: qty 4

## 2016-09-14 MED ORDER — SODIUM CHLORIDE 0.9% FLUSH
3.0000 mL | Freq: Two times a day (BID) | INTRAVENOUS | Status: DC
Start: 2016-09-14 — End: 2016-09-15

## 2016-09-14 NOTE — Progress Notes (Signed)
eLink Physician-Brief Progress Note Patient Name: Patricia Woodard DOB: May 25, 1932 MRN: [id number]   Date of Service  08/18/2016  HPI/Events of Note  83 y.o. female with a known history of Breast cancer with metastases, incisional disease, dementia, hypertension, chronic anemia, chronic sacral decubitus ulcer, diastolic CHF presents to the emergency room from dialysis center due to hypotension.  FAMILY REFUSES CVL, patient is DNR Family reassessing status in 24 hrs  eICU Interventions  STep Down status Vasopressors as needed Consider starting bicarb infusion Empiric abx Consider stress dose steroids     Intervention Category Evaluation Type: New Patient Evaluation  Amen Staszak 09/08/2016, 7:35 PM

## 2016-09-14 NOTE — ED Notes (Signed)
Pt provided ice pack for R knee- family states that is where she fell most recently. Knee appears more swollen than L.   Pt provided denture cup and multiple pillows per family request. Family requesting pain medication, pt denies pain.

## 2016-09-14 NOTE — ED Triage Notes (Signed)
Pt presents to ED via [facility] emergency traffic. Pt came from dialysis where she did NOT receive treatment because BP was low. EMS first BP 69/35, came up to 78/43 after 235ml. Pt awake, EMS states pt became more alert during transport. CBG 231. Ems reports husband dropped pt off at dialysis this morning, MWF pt. Fistula lower left arm. EMS reports pt is a CA and EMS reports it has spread and there is talk about moving pt to hospice. EMS reports pt is full code. PT has L upper chest pacemaker. EMS placed a 22 R AC.   Pt answering questions correctly. Talks quietly but answers all question by this RN. Denies pain. States she fell inside yesterday but did not hit head.

## 2016-09-14 NOTE — ED Notes (Signed)
Pt had brown soft BM. Pt cleaned and changed. Chuck and new brief placed under pt. Pink pad placed over buttcheeks. Removed soiled gauze pad that was on bottom. Pt has sloughing skin that is weeping on bottom. Pt rolled from side to side with assistance.

## 2016-09-14 NOTE — ED Notes (Signed)
Pt having a BM. Cleaned and changed but still having BM. Will let pt finish and then clean again before transporting.

## 2016-09-14 NOTE — ED Notes (Signed)
Date and time results received: 09/14/2016 1207   Test: troponin Critical Value: 0.58  Name of Provider Notified: Dr. Kerman Passey

## 2016-09-14 NOTE — ED Notes (Signed)
X-ray at bedside

## 2016-09-14 NOTE — ED Notes (Signed)
CODE SEPSIS CALLED TO DOUG AT [facility] 

## 2016-09-14 NOTE — ED Notes (Signed)
Pt given meal tray and apple sauce and grape juice

## 2016-09-14 NOTE — ED Notes (Signed)
Pt placed on 3 L nasal cannula, oxygen came up to 98%. Will continue to monitor.

## 2016-09-14 NOTE — ED Notes (Signed)
Date and time results received: 08/27/2016 1220   Test: lactic acid Critical Value: 6.5  Name of Provider Notified: Dr. Kerman Passey  Orders Received? Or Actions Taken?: Called code sepsis

## 2016-09-14 NOTE — ED Provider Notes (Signed)
[facility] Emergency Department Provider Note  Time seen: 11:27 AM  I have reviewed the triage vital signs and the nursing notes.   HISTORY  Chief Complaint Hypotension    HPI Patricia Woodard is a 83 y.o. female with a past medical history of end-stage renal disease on hemodialysis Monday, Wednesday, Friday, MI, hypertension, CHF, metastatic breast cancer with bone and lung metastases presents to the emergency department for hypotension. According to EMS reported the patient went to dialysis as scheduled this morning however the patient was found to be hypotensive with a systolic blood pressure in the 50s and EMS was called. EMS brought the patient to the emergency Department via emergency traffic for a systolic blood pressure of 60. Patient received 200 cc of IV fluids in route to the hospital upon arrival the patient has a systolic blood pressure 69 with a diastolic of 51. Patient is hypoxic at 86%. Patient is awake and alert at this time, denies any pain or difficulty breathing.  Past Medical History:  Diagnosis Date  . Anemia   . Arrhythmia   . Cancer ([place])    breast ca - left, kidney  . Dialysis AV fistula infection ([place])    Left arm Mon, Wed, Fri dialysis  . Hypertension   . MI, old   . Renal insufficiency    dialysis  . Stroke [facility])    2009    Patient Active Problem List   Diagnosis Date Noted  . DNR (do not resuscitate) 08/30/2016  . Palliative care by specialist 08/30/2016  . Metastatic cancer ([place])   . Acute respiratory failure with hypoxia ([place]) 08/25/2016  . Acute on chronic diastolic CHF (congestive heart failure) ([place]) 08/25/2016  . End stage renal disease ([place]) 08/25/2016  . Generalized weakness 08/25/2016  . Hypokalemia 08/25/2016  . Pressure injury of skin 08/25/2016  . Goals of care, counseling/discussion 08/19/2016  . Macrocytic anemia 08/05/2016  . Malignant neoplasm of left breast in female, estrogen receptor negative ([place])  08/05/2016  . S/P ORIF (open reduction internal fixation) fracture 05/17/2016  . Hypotension 05/17/2016  . ESRD on dialysis ([place]) 05/17/2016  . Chronic atrial fibrillation ([place]) 05/17/2016  . Diastolic CHF, chronic ([place]) 05/17/2016  . Hyponatremia 05/17/2016  . Essential hypertension 05/17/2016  . Multiple lung nodules on CT 05/17/2016  . Respiratory failure, acute ([place]) 05/14/2016  . Closed right hip fracture ([place]) 05/13/2016    Past Surgical History:  Procedure Laterality Date  . APPENDECTOMY    . AV FISTULA PLACEMENT Left   . CHOLECYSTECTOMY    . HIP FRACTURE SURGERY Left   . ICD LEAD REMOVAL N/A 11/04/2014   Procedure: ICD LEAD REMOVAL;  Surgeon: Isaias Cowman, MD;  Location: [facility] ORS;  Service: Cardiovascular;  Laterality: N/A;  . INSERT / REPLACE / REMOVE PACEMAKER    . kidney tumor Right   . ORIF HIP FRACTURE Right 05/14/2016   Procedure: OPEN REDUCTION INTERNAL FIXATION HIP;  Surgeon: Earnestine Leys, MD;  Location: [facility] ORS;  Service: Orthopedics;  Laterality: Right;    Prior to Admission medications   Medication Sig Start Date End Date Taking? Authorizing Provider  acetaminophen (TYLENOL) 325 MG tablet Take 2 tablets (650 mg total) by mouth every 6 (six) hours as needed for mild pain (or Fever >/= 101). 05/17/16   Theodoro Grist, MD  Amino Acid Infusion (PROSOL) 20 % SOLN  06/15/16   Historical Provider, MD  amiodarone (PACERONE) 200 MG tablet Take 200 mg by mouth daily.  Historical Provider, MD  amLODipine (NORVASC) 10 MG tablet Take 10 mg by mouth daily.    Historical Provider, MD  anastrozole (ARIMIDEX) 1 MG tablet Take 1 mg by mouth daily.    Historical Provider, MD  aspirin EC 81 MG tablet Take 81 mg by mouth daily.    Historical Provider, MD  Azilsartan Medoxomil (EDARBI) 80 MG TABS Take 80 mg by mouth daily.    Historical Provider, MD  bisacodyl (DULCOLAX) 10 MG suppository Place 1 suppository (10 mg total) rectally daily as needed for moderate constipation.  05/17/16   Theodoro Grist, MD  carvedilol (COREG) 6.25 MG tablet Take 6.25 mg by mouth 2 (two) times daily with a meal.  07/30/16   Historical Provider, MD  cloNIDine (CATAPRES) 0.1 MG tablet Take 0.1 mg by mouth 3 (three) times daily. As directed    Historical Provider, MD  clopidogrel (PLAVIX) 75 MG tablet Take 75 mg by mouth daily.    Historical Provider, MD  diphenhydrAMINE (BENADRYL) 25 MG tablet Take 25 mg by mouth every 6 (six) hours as needed.    Historical Provider, MD  esomeprazole (NEXIUM) 20 MG capsule Take 20 mg by mouth daily at 12 noon.    Historical Provider, MD  ferrous sulfate 325 (65 FE) MG tablet Take 1 tablet (325 mg total) by mouth daily with breakfast. 05/18/16   Theodoro Grist, MD  furosemide (LASIX) 80 MG tablet TAKE ONE TABLET BY MOUTH EVERY DAY 12/04/11   Jackolyn Confer, MD  gabapentin (NEURONTIN) 100 MG capsule Take 1 capsule (100 mg total) by mouth 3 (three) times daily. 09/01/16   Dustin Flock, MD  hydrALAZINE (APRESOLINE) 100 MG tablet Take 100 mg by mouth 2 (two) times daily.    Historical Provider, MD  HYDROcodone-acetaminophen (NORCO/VICODIN) 5-325 MG tablet Take 1 tablet by mouth every 4 (four) hours. 08/31/16   Dustin Flock, MD  isosorbide mononitrate (IMDUR) 30 MG 24 hr tablet Take 30 mg by mouth daily.  07/05/16   Historical Provider, MD  levothyroxine (SYNTHROID, LEVOTHROID) 100 MCG tablet Take 100 mcg by mouth daily before breakfast.    Historical Provider, MD  lovastatin (MEVACOR) 40 MG tablet TAKE ONE TABLET BY MOUTH AT BEDTIME 05/29/11   Jackolyn Confer, MD  mirtazapine (REMERON) 15 MG tablet Take 15 mg by mouth at bedtime.    Historical Provider, MD  multivitamin (RENA-VIT) TABS tablet Take 1 tablet by mouth daily. 0.8mg     Historical Provider, MD  senna (SENOKOT) 8.6 MG TABS tablet Take 1 tablet (8.6 mg total) by mouth 2 (two) times daily. 05/17/16   Theodoro Grist, MD  SENSIPAR 30 MG tablet TAKE ONE TABLET BY MOUTH AT DINNER. Patient taking differently:  TAKE ONE TABLET BY MOUTH ON DIALYSIS DAYS. 02/27/11   Jackolyn Confer, MD  sevelamer carbonate (RENVELA) 800 MG tablet Take 800 mg by mouth 3 (three) times daily with meals.    Historical Provider, MD    Allergies  Allergen Reactions  . Penicillins Itching  . Ivp Dye [Iodinated Diagnostic Agents] Rash  . Shellfish Allergy Rash    History reviewed. No pertinent family history.  Social History Social History  Substance Use Topics  . Smoking status: Former Smoker    Packs/day: 0.50    Years: 60.00    Quit date: 2017  . Smokeless tobacco: Former Systems developer    Quit date: 04/2016  . Alcohol use No    Review of Systems Constitutional: Negative for fever. Cardiovascular: Negative for chest pain. Respiratory:  Negative for shortness of breath. Gastrointestinal: Negative for abdominal pain Neurological: Negative for headache 10-point ROS otherwise negative.  ____________________________________________   PHYSICAL EXAM:  VITAL SIGNS: ED Triage Vitals [08/21/2016 1121]  Enc Vitals Group     BP (!) 69/51     Pulse Rate 65     Resp (!) 31     Temp 97.7 F (36.5 C)     Temp Source Axillary     SpO2 (!) 86 %     Weight      Height      Head Circumference      Peak Flow      Pain Score 0     Pain Loc      Pain Edu?      Excl. in [place]?    Constitutional: Alert, answers questions, follows some commands, appears chronically ill Eyes: Normal exam ENT   Head: Normocephalic and atraumatic.   Mouth/Throat: Somewhat dry mucous membranes. Cardiovascular: Normal rate, regular rhythm. No murmur Respiratory: Normal respiratory effort without tachypnea nor retractions. Breath sounds are clear  Gastrointestinal: Soft and nontender. No distention.  Musculoskeletal: Upper extremity fistula. Neurologic: Patient has weakness, seizures lower extremity ulcer boots, cannot perform an adequate neurological exam given weakness Skin:  Skin is warm, dry Psychiatric: Mood and affect are  normal.  ____________________________________________    EKG  EKG reviewed and interpreted by myself shows an atrial ventricular dual paced rhythm at 61 bpm with a widened QRS consistent with a paced rhythm with no obvious concerning ST changes.  ____________________________________________    RADIOLOGY  IMPRESSION: Fibrosis in the bases. Nodular opacity left upper lobe near apex. Other nodular lesions in the apical regions are not appreciable by radiography blood are appreciable on recent CT. There is no edema or consolidation. Stable cardiomegaly. There is aortic atherosclerosis.  ____________________________________________   INITIAL IMPRESSION / ASSESSMENT AND PLAN / ED COURSE  Pertinent labs & imaging results that were available during my care of the patient were reviewed by me and considered in my medical decision making (see chart for details).  Patient presents the emergency department over concerns of hypotension found to be hypotensive in the 50s at dialysis. In reviewing the patient's records it appears that she was discharged from the hospital 09/01/16 after an admission for hypoxia and hypotension similar to today's presentation. [facility]. However she was not a candidate for hospice house as she is being maintained on dialysis. We will check labs, closely monitor in the emergency department. We will dose 500 cc of normal saline and reassess blood pressure. Patient has a DO NOT RESUSCITATE order well-documented during her recent admission.  Patient's labs show significant lactic acidosis with a lactate of 6.5. Patient's troponin is elevated 0.58 however she appears to have an elevated baseline troponin 0.2, and as the patient is on dialysis I believe this is likely more related to the hypotension and baseline kidney failure. Patient's hemoglobin is currently 7.7 in reviewing the patient's records she appears to have chronic anemia with  intermittent transfusion. Potassium is 4.6. The patient's family is now here with the patient, had a long discussion with family members regarding how ill the patient is in the grim prognosis going forward. They have home palliative care at this time but are not interested in hospice, as they will not allow her to be on dialysis at hospice home. I discussed with the family what treatments they would like and essentially they would like  everything done short of resuscitation if needed. Patient remained hypotensive currently 72 systolic after a 599 cc bolus. We will start IV antibiotics given the patient's continued hypotension and lactic acidosis. We will continue with IV fluids for a total of 2 L. Patient will need to be closely monitored as she is a dialysis patient to avoid fluid overload. Currently on chest x-ray there is no signs of pulmonary edema. I discussed with the hospitalist for admission, they would like to see if the blood pressure improves with IV fluids or if the patient will require pressors and an ICU admission.   Blood pressure currently 79/45. I again discussed with the 2 daughters in person and a third daughter over the phone treatment options. At this time they wish to hold off on using pressors, and continue to use fluids and antibiotics. At this time is not clear if the patient is septic or if this is more cardiac related shock. Patient's only possible obvious source for infection would be a sacral ulcer, otherwise she does not make urine, chest x-ray is clear. No fever or white blood cell count elevation. We will continue to reassess. The patient still mentating well, able to answer questions, denies any pain. We will admit the patient to the hospitalist for continued treatment.   CRITICAL CARE Performed by: Harvest Dark   Total critical care time: 45 minutes  Critical care time was exclusive of separately billable procedures and treating other patients.  Critical care was  necessary to treat or prevent imminent or life-threatening deterioration.  Critical care was time spent personally by me on the following activities: development of treatment plan with patient and/or surrogate as well as nursing, discussions with consultants, evaluation of patient's response to treatment, examination of patient, obtaining history from patient or surrogate, ordering and performing treatments and interventions, ordering and review of laboratory studies, ordering and review of radiographic studies, pulse oximetry and re-evaluation of patient's condition.   ____________________________________________   FINAL CLINICAL IMPRESSION(S) / ED DIAGNOSES  Hypotension Hypoxia    Harvest Dark, MD 08/27/2016 1409

## 2016-09-14 NOTE — Progress Notes (Signed)
Pt temp 35.1 upon arrival, placed on a [employer] [facility] [employer].  Lactic acid of 4.9. Notified [place], NP.  Heart rate dropping into the 40's and coming back up. NP notified. No new orders given. Will continue to monitor closely.

## 2016-09-14 NOTE — H&P (Signed)
[place] [facility] [place] NAME: Patricia Woodard    MR#:  [id number]  DATE OF BIRTH:  [birth year]  DATE OF ADMISSION:  08/21/2016  PRIMARY CARE PHYSICIAN: Madelyn Brunner, MD   REQUESTING/REFERRING PHYSICIAN: Dr. Kerman Passey  CHIEF COMPLAINT:   Chief Complaint  Patient presents with  . Hypotension    HISTORY OF PRESENT ILLNESS:  Patricia Woodard  is a 84 y.o. female with a known history of Breast cancer with metastases, incisional disease, dementia, hypertension, chronic anemia, chronic sacral decubitus ulcer, diastolic CHF presents to the emergency room from dialysis center due to hypotension. Patient did not get her dialysis today. She is on Monday, Wednesday, Friday schedule. Here patient has been found to have systolic blood pressure in the low 60s which has not responded well to fluids of 1 L. Chest x-ray is clear. Lactic acid elevated at 6.5. Likely source of septic shock being sacral decubitus ulcer. Family did not want a central line placed for pressors. They want to try IV antibiotics for 24 hours and if no improvement want further aggressive measures. They understand patient is acutely ill. During last admission patient had expressed her wishes to continue dialysis as long as she can. This limited her choices of hospice.  PAST MEDICAL HISTORY:   Past Medical History:  Diagnosis Date  . Anemia   . Arrhythmia   . Cancer ([place])    breast ca - left, kidney  . Dialysis AV fistula infection ([place])    Left arm Mon, Wed, Fri dialysis  . Hypertension   . MI, old   . Renal insufficiency    dialysis  . Stroke [facility])    2009    PAST SURGICAL HISTORY:   Past Surgical History:  Procedure Laterality Date  . APPENDECTOMY    . AV FISTULA PLACEMENT Left   . CHOLECYSTECTOMY    . HIP FRACTURE SURGERY Left   . ICD LEAD REMOVAL N/A 11/04/2014   Procedure: ICD LEAD REMOVAL;  Surgeon: Isaias Cowman, MD;  Location: [facility] ORS;  Service: Cardiovascular;   Laterality: N/A;  . INSERT / REPLACE / REMOVE PACEMAKER    . kidney tumor Right   . ORIF HIP FRACTURE Right 05/14/2016   Procedure: OPEN REDUCTION INTERNAL FIXATION HIP;  Surgeon: Earnestine Leys, MD;  Location: [facility] ORS;  Service: Orthopedics;  Laterality: Right;    SOCIAL HISTORY:   Social History  Substance Use Topics  . Smoking status: Former Smoker    Packs/day: 0.50    Years: 60.00    Quit date: 2017  . Smokeless tobacco: Former Systems developer    Quit date: 04/2016  . Alcohol use No    FAMILY HISTORY:  History reviewed. No pertinent family history.  DRUG ALLERGIES:   Allergies  Allergen Reactions  . Penicillins Itching  . Ivp Dye [Iodinated Diagnostic Agents] Rash  . Shellfish Allergy Rash    REVIEW OF SYSTEMS:   Review of Systems  Unable to perform ROS: Mental status change    MEDICATIONS AT HOME:   Prior to Admission medications   Medication Sig Start Date End Date Taking? Authorizing Provider  amiodarone (PACERONE) 200 MG tablet Take 200 mg by mouth daily.   Yes Historical Provider, MD  aspirin EC 81 MG tablet Take 81 mg by mouth daily.   Yes Historical Provider, MD  carvedilol (COREG) 6.25 MG tablet Take 6.25 mg by mouth 2 (two) times daily with a meal.  07/30/16  Yes Historical Provider, MD  clopidogrel (PLAVIX) 75 MG tablet Take 75 mg by mouth daily.   Yes Historical Provider, MD  esomeprazole (NEXIUM) 20 MG capsule Take 20 mg by mouth daily at 12 noon.   Yes Historical Provider, MD  ferrous sulfate 325 (65 FE) MG tablet Take 1 tablet (325 mg total) by mouth daily with breakfast. 05/18/16  Yes Theodoro Grist, MD  furosemide (LASIX) 80 MG tablet TAKE ONE TABLET BY MOUTH EVERY DAY 12/04/11  Yes Jackolyn Confer, MD  gabapentin (NEURONTIN) 100 MG capsule Take 1 capsule (100 mg total) by mouth 3 (three) times daily. 09/01/16  Yes Dustin Flock, MD  hydrALAZINE (APRESOLINE) 100 MG tablet Take 100 mg by mouth 2 (two) times daily.   Yes Historical Provider, MD  isosorbide  mononitrate (IMDUR) 30 MG 24 hr tablet Take 30 mg by mouth daily.  07/05/16  Yes Historical Provider, MD  levothyroxine (SYNTHROID, LEVOTHROID) 100 MCG tablet Take 100 mcg by mouth daily before breakfast.   Yes Historical Provider, MD  lovastatin (MEVACOR) 40 MG tablet TAKE ONE TABLET BY MOUTH AT BEDTIME 05/29/11  Yes Jackolyn Confer, MD  mirtazapine (REMERON) 15 MG tablet Take 15 mg by mouth at bedtime.   Yes Historical Provider, MD  multivitamin (RENA-VIT) TABS tablet Take 1 tablet by mouth daily. 0.8mg    Yes Historical Provider, MD  senna (SENOKOT) 8.6 MG TABS tablet Take 1 tablet (8.6 mg total) by mouth 2 (two) times daily. 05/17/16  Yes Theodoro Grist, MD  SENSIPAR 30 MG tablet TAKE ONE TABLET BY MOUTH AT DINNER. Patient taking differently: TAKE ONE TABLET BY MOUTH ON DIALYSIS DAYS. 02/27/11  Yes Jackolyn Confer, MD  sevelamer carbonate (RENVELA) 800 MG tablet Take 800 mg by mouth 3 (three) times daily with meals.   Yes Historical Provider, MD  acetaminophen (TYLENOL) 325 MG tablet Take 2 tablets (650 mg total) by mouth every 6 (six) hours as needed for mild pain (or Fever >/= 101). 05/17/16   Theodoro Grist, MD  Amino Acid Infusion (PROSOL) 20 % SOLN  06/15/16   Historical Provider, MD  amLODipine (NORVASC) 10 MG tablet Take 10 mg by mouth daily.    Historical Provider, MD  anastrozole (ARIMIDEX) 1 MG tablet Take 1 mg by mouth daily.    Historical Provider, MD  Azilsartan Medoxomil (EDARBI) 80 MG TABS Take 80 mg by mouth daily.    Historical Provider, MD  bisacodyl (DULCOLAX) 10 MG suppository Place 1 suppository (10 mg total) rectally daily as needed for moderate constipation. 05/17/16   Theodoro Grist, MD  cloNIDine (CATAPRES) 0.1 MG tablet Take 0.1 mg by mouth 3 (three) times daily. As directed    Historical Provider, MD  diphenhydrAMINE (BENADRYL) 25 MG tablet Take 25 mg by mouth every 6 (six) hours as needed.    Historical Provider, MD  HYDROcodone-acetaminophen (NORCO/VICODIN) 5-325 MG  tablet Take 1 tablet by mouth every 4 (four) hours. 08/31/16   Dustin Flock, MD     VITAL SIGNS:  Blood pressure (!) 94/53, pulse (!) 54, temperature 97.7 F (36.5 C), temperature source Axillary, resp. rate 19, SpO2 100 %.  PHYSICAL EXAMINATION:  Physical Exam  GENERAL:  84 y.o.-year-old patient lying in the bed. Critically ill EYES: Pupils equal, round, reactive to light and accommodation. No scleral icterus. Extraocular muscles intact.  HEENT: Head atraumatic, normocephalic. Oropharynx and nasopharynx clear. No oropharyngeal erythema, moist oral mucosa  NECK:  Supple, no jugular venous distention. No thyroid enlargement, no tenderness.  LUNGS: Normal breath sounds bilaterally, no  wheezing, rales, rhonchi. No use of accessory muscles of respiration.  CARDIOVASCULAR: S1, S2 normal. No murmurs, rubs, or gallops.  ABDOMEN: Soft, nontender, nondistended. Bowel sounds present. No organomegaly or mass.  EXTREMITIES: No pedal edema PSYCHIATRIC: The patient is awake SKIN: Large sacral decubitus ulcer- unstageable  LABORATORY PANEL:   CBC  Recent Labs Lab 09/12/2016 1123  WBC 8.7  HGB 7.7*  HCT 24.4*  PLT 151   ------------------------------------------------------------------------------------------------------------------  Chemistries   Recent Labs Lab 08/24/2016 1123  NA 136  K 4.6  CL 98*  CO2 21*  GLUCOSE 184*  BUN 40*  CREATININE 4.57*  CALCIUM 9.2  AST 37  ALT 12*  ALKPHOS 101  BILITOT 0.8   ------------------------------------------------------------------------------------------------------------------  Cardiac Enzymes  Recent Labs Lab 09/07/2016 1123  TROPONINI 0.58*   ------------------------------------------------------------------------------------------------------------------  RADIOLOGY:  Dg Chest Portable 1 View  Result Date: 09/07/2016 CLINICAL DATA:  Hypotension.  Renal failure. EXAM: PORTABLE CHEST 1 VIEW COMPARISON:  Chest radiograph August 25, 2016 and chest CT August 25, 2016 FINDINGS: There is a nodular opacity in the left upper lobe near the apex measuring approximately 1 cm, similar to recent study. A small nodular opacity in the right apex seen on CT is not appreciable by radiography. A second more medial lesion in the left apex is also not well seen by radiography. There is patchy fibrosis in the lung bases. There is no edema or consolidation. There is cardiomegaly with pulmonary vascularity within normal limits. There is aortic atherosclerosis. No evident adenopathy. Pacemaker leads are attached to the right atrium and right ventricle. There are surgical clips in the left axillary region. IMPRESSION: Fibrosis in the bases. Nodular opacity left upper lobe near apex. Other nodular lesions in the apical regions are not appreciable by radiography blood are appreciable on recent CT. There is no edema or consolidation. Stable cardiomegaly. There is aortic atherosclerosis. Electronically Signed   By: Lowella Grip III M.D.   On: 08/28/2016 11:56     IMPRESSION AND PLAN:   * Septic shock likely source being sacral ulcer Lactic acid 6.5 And has poor prognosis due to breast cancer with metastasis. She has declined significantly in the last few months. Family are requesting that we do IV antibiotics. No central line at this time. They do not want any pressors in one to see if IV antibiotics help. They're okay with using dopamine through peripheral IV. Start vancomycin and aztreonam. Send blood cultures. Consult palliative care.  If there is no improvement in 24 hours on the blood pressure family once the central line with pressors which they will reconsider tomorrow morning.  * Acute encephalopathy over dementia due to sepsis. Monitor.  * End-stage renal disease on hemodialysis. Patient missed her hemodialysis today due to hypotension. [facility] consult nephrology for her dialysis needs.  * Acute on chronic anemia. Likely worsening anemia  of chronic disease. No bleeding. Family does not want any transfusion at this time. Repeat labs in the morning.  * Elevated troponin likely demand ischemia. No procedures planned. Will not repeat troponin.  * Hypertension. Presently patient is hypotensive. Hold medications.  * DVT prophylaxis with heparin   All the records are reviewed and case discussed with ED provider. Management plans discussed with the patient, family and they are in agreement.  CODE STATUS: DNR  TOTAL CC TIME TAKING CARE OF THIS PATIENT: 60 minutes.   Hillary Bow R M.D on 09/01/2016 at 2:47 PM  Between 7am to 6pm - Pager - [phone]  After  6pm go to [email or web address] - password [email or web address] [facility]  Office  [phone]  CC: Primary care physician; Madelyn Brunner, MD  Note: This dictation was prepared with Dragon dictation along with smaller phrase technology. Any transcriptional errors that result from this process are unintentional.

## 2016-09-14 NOTE — Consult Note (Signed)
Name: Patricia Woodard MRN: [id number] DOB: 08/25/1931    ADMISSION DATE:  09/03/2016 CONSULTATION DATE: 08/21/2016  REFERRING MD :  Dr. Darvin Neighbours   CHIEF COMPLAINT:  Hypotension   BRIEF PATIENT DESCRIPTION:  83 yo female with severe septic shock secondary to sacral decubitus ulcer requiring pressors via peripheral iv.  SIGNIFICANT EVENTS  03/28-Pt admitted to [facility] ICU with septic shock  PCCM contacted for additional management   STUDIES:  None  HISTORY OF PRESENT ILLNESS:   This is an 83 yo female with PMH of Stage IV Metastatic Breast Cancer, Dementia, Atrial Fibrillation, CAD, End Stage Renal Disease, Hemodialysis M-W-F, Cardiomyopathy, Hyperlipidemia, HTN, Hypothyroidism, PAD, Renal Cell Carcinoma of Right Kidney, Sinoatrial Node Dysfunction, Chronic Anemia, Chronic Sacral Decubitus Ulcer, Diastolic CHF, and Pacemaker/Defibrillator (04/2008).  She presented to [facility] ER 03/28 from the dialysis center due to hypotension, therefore she was unable to receive hemodialysis.  Upon arrival to the ER her systolic bp was in the 33'A, therefore she received 2.5L of NS with minimal improvement of bp.  CXR was negative, however lactic acid was elevated at 6.5, therefore pt met criteria for septic shock likely secondary to sacral decubitus ulcer.  Pts family declined central line placement they are agreeable to pressors via peripheral iv and iv antibiotics for the next 24hrs and if there is no improvement will discuss change in plan of care.  Pts family also concerned about pt c/o right knee pain and swelling following the pt sliding out of a chair several days ago.  PCCM consulted for additional management of severe septic shock secondary to sacral decubitus ulcer requiring pressors.  PAST MEDICAL HISTORY :   has a past medical history of Anemia; Arrhythmia; Cancer ([place]); Dialysis AV fistula infection ([place]); Hypertension; MI, old; Renal insufficiency; and Stroke ([place]).  has a past surgical history that  includes AV fistula placement (Left); kidney tumor (Right); Hip fracture surgery (Left); Appendectomy; Cholecystectomy; Icd lead removal (N/A, 11/04/2014); ORIF hip fracture (Right, 05/14/2016); and Insert / replace / remove pacemaker. Prior to Admission medications   Medication Sig Start Date End Date Taking? Authorizing Provider  amiodarone (PACERONE) 200 MG tablet Take 200 mg by mouth daily.   Yes Historical Provider, MD  aspirin EC 81 MG tablet Take 81 mg by mouth daily.   Yes Historical Provider, MD  carvedilol (COREG) 6.25 MG tablet Take 6.25 mg by mouth 2 (two) times daily with a meal.  07/30/16  Yes Historical Provider, MD  clopidogrel (PLAVIX) 75 MG tablet Take 75 mg by mouth daily.   Yes Historical Provider, MD  esomeprazole (NEXIUM) 20 MG capsule Take 20 mg by mouth daily at 12 noon.   Yes Historical Provider, MD  ferrous sulfate 325 (65 FE) MG tablet Take 1 tablet (325 mg total) by mouth daily with breakfast. 05/18/16  Yes Theodoro Grist, MD  furosemide (LASIX) 80 MG tablet TAKE ONE TABLET BY MOUTH EVERY DAY 12/04/11  Yes Jackolyn Confer, MD  gabapentin (NEURONTIN) 100 MG capsule Take 1 capsule (100 mg total) by mouth 3 (three) times daily. 09/01/16  Yes Dustin Flock, MD  hydrALAZINE (APRESOLINE) 100 MG tablet Take 100 mg by mouth 2 (two) times daily.   Yes Historical Provider, MD  isosorbide mononitrate (IMDUR) 30 MG 24 hr tablet Take 30 mg by mouth daily.  07/05/16  Yes Historical Provider, MD  levothyroxine (SYNTHROID, LEVOTHROID) 100 MCG tablet Take 100 mcg by mouth daily before breakfast.   Yes Historical Provider, MD  lovastatin (MEVACOR) 40  MG tablet TAKE ONE TABLET BY MOUTH AT BEDTIME 05/29/11  Yes Jackolyn Confer, MD  mirtazapine (REMERON) 15 MG tablet Take 15 mg by mouth at bedtime.   Yes Historical Provider, MD  multivitamin (RENA-VIT) TABS tablet Take 1 tablet by mouth daily. 0.56m   Yes Historical Provider, MD  senna (SENOKOT) 8.6 MG TABS tablet Take 1 tablet (8.6 mg total)  by mouth 2 (two) times daily. 05/17/16  Yes RTheodoro Grist MD  SENSIPAR 30 MG tablet TAKE ONE TABLET BY MOUTH AT DINNER. Patient taking differently: TAKE ONE TABLET BY MOUTH ON DIALYSIS DAYS. 02/27/11  Yes JJackolyn Confer MD  sevelamer carbonate (RENVELA) 800 MG tablet Take 800 mg by mouth 3 (three) times daily with meals.   Yes Historical Provider, MD  acetaminophen (TYLENOL) 325 MG tablet Take 2 tablets (650 mg total) by mouth every 6 (six) hours as needed for mild pain (or Fever >/= 101). 05/17/16   RTheodoro Grist MD  Amino Acid Infusion (PROSOL) 20 % SOLN  06/15/16   Historical Provider, MD  amLODipine (NORVASC) 10 MG tablet Take 10 mg by mouth daily.    Historical Provider, MD  anastrozole (ARIMIDEX) 1 MG tablet Take 1 mg by mouth daily.    Historical Provider, MD  Azilsartan Medoxomil (EDARBI) 80 MG TABS Take 80 mg by mouth daily.    Historical Provider, MD  bisacodyl (DULCOLAX) 10 MG suppository Place 1 suppository (10 mg total) rectally daily as needed for moderate constipation. 05/17/16   RTheodoro Grist MD  cloNIDine (CATAPRES) 0.1 MG tablet Take 0.1 mg by mouth 3 (three) times daily. As directed    Historical Provider, MD  diphenhydrAMINE (BENADRYL) 25 MG tablet Take 25 mg by mouth every 6 (six) hours as needed.    Historical Provider, MD  HYDROcodone-acetaminophen (NORCO/VICODIN) 5-325 MG tablet Take 1 tablet by mouth every 4 (four) hours. 08/31/16   SDustin Flock MD   Allergies  Allergen Reactions  . Penicillins Itching  . Ivp Dye [Iodinated Diagnostic Agents] Rash  . Shellfish Allergy Rash    FAMILY HISTORY:  family history is not on file. SOCIAL HISTORY:  reports that she quit smoking about 14 months ago. She has a 30.00 pack-year smoking history. She quit smokeless tobacco use about 4 months ago. She reports that she does not drink alcohol or use drugs.  REVIEW OF SYSTEMS:  Positives in BOLD  Constitutional: fever, chills, weight loss, malaise/fatigue and diaphoresis.    HENT: Negative for hearing loss, ear pain, nosebleeds, congestion, sore throat, neck pain, tinnitus and ear discharge.   Eyes: Negative for blurred vision, double vision, photophobia, pain, discharge and redness.  Respiratory: Negative for cough, hemoptysis, sputum production, shortness of breath, wheezing and stridor.   Cardiovascular: Negative for chest pain, palpitations, orthopnea, claudication, leg swelling and PND.  Gastrointestinal: Negative for heartburn, nausea, vomiting, abdominal pain, diarrhea, constipation, blood in stool and melena.  Genitourinary: Negative for dysuria, urgency, frequency, hematuria and flank pain.  Musculoskeletal: right knee pain and swelling, back pain, joint pain and falls.  Skin: Negative for itching and rash.  Neurological: Negative for dizziness, tingling, tremors, sensory change, speech change, focal weakness, seizures, loss of consciousness, weakness and headaches.  Endo/Heme/Allergies: Negative for environmental allergies and polydipsia. Does not bruise/bleed easily.  SUBJECTIVE:  Pt currently awake no complaints at this time.  VITAL SIGNS: Temp:  [97.7 F (36.5 C)] 97.7 F (36.5 C) (03/28 1121) Pulse Rate:  [43-65] 56 (03/28 1530) Resp:  [18-31] 18 (03/28 1530) BP: (  60-99)/(35-56) 85/45 (03/28 1530) SpO2:  [84 %-100 %] 100 % (03/28 1530)  PHYSICAL EXAMINATION: General: chronically ill appearing AA female, NAD Neuro: alert and oriented, follows commands HEENT: supple, no JVD Cardiovascular: paced rhythm, regular rate, no M/R/G Lungs: clear throughout, even, non labored  Abdomen: +BS x4, soft, non tender, non distended Musculoskeletal: 2+ right knee swelling Skin: large unstageable sacral decubitus ulcer   Recent Labs Lab 09/03/2016 1123  NA 136  K 4.6  CL 98*  CO2 21*  BUN 40*  CREATININE 4.57*  GLUCOSE 184*    Recent Labs Lab 09/17/2016 1123  HGB 7.7*  HCT 24.4*  WBC 8.7  PLT 151   Dg Chest Portable 1 View  Result Date:  09/11/2016 CLINICAL DATA:  Hypotension.  Renal failure. EXAM: PORTABLE CHEST 1 VIEW COMPARISON:  Chest radiograph August 25, 2016 and chest CT August 25, 2016 FINDINGS: There is a nodular opacity in the left upper lobe near the apex measuring approximately 1 cm, similar to recent study. A small nodular opacity in the right apex seen on CT is not appreciable by radiography. A second more medial lesion in the left apex is also not well seen by radiography. There is patchy fibrosis in the lung bases. There is no edema or consolidation. There is cardiomegaly with pulmonary vascularity within normal limits. There is aortic atherosclerosis. No evident adenopathy. Pacemaker leads are attached to the right atrium and right ventricle. There are surgical clips in the left axillary region. IMPRESSION: Fibrosis in the bases. Nodular opacity left upper lobe near apex. Other nodular lesions in the apical regions are not appreciable by radiography blood are appreciable on recent CT. There is no edema or consolidation. Stable cardiomegaly. There is aortic atherosclerosis. Electronically Signed   By: Lowella Grip III M.D.   On: 09/13/2016 11:56    ASSESSMENT / PLAN: Severe septic shock likely secondary to sacral decubitus ulcer  Lactic acidosis  Stage IV Metastatic Breast Cancer Hx: ESRD Hemodialysis M-W-F P: Supplemental O2 to maintain O2 sats >92% or for dyspnea Continue vancomycin and aztreonam  Follow blood cultures  Trend WBC's and monitor fever curve Trend lactic acid and PCT's  Prn peripheral Neosynephrine to maintain map >69 and systolic bp >67 (per pts. family wishes will NOT place central line) Palliative Care and Wound Care consulted appreciate input   -Update: Pts daughters at bedside and updated regarding plan of care and all questions answered.  According to pts. Family they would like to try iv antibiotics and peripheral pressors for the next 24 hrs. If there is no signs of improvement they would not  want aggressive measures to be performed they understand the pt has continued to decline.  During pts last admission the pt expressed she wished to continue hemodialysis at that time for as long as she could.    Marda Stalker, [place] Pager [phone] (please enter 7 digits) [place] Pager [phone] (please enter 7 digits)   Merton Border, MD PCCM service Mobile [phone] Pager [phone] 10/11/2016

## 2016-09-14 NOTE — Progress Notes (Signed)
ANTIBIOTIC CONSULT NOTE - INITIAL  Pharmacy Consult for Vancomycin , Aztreonam  Indication: sepsis  Allergies  Allergen Reactions  . Penicillins Itching  . Ivp Dye [Iodinated Diagnostic Agents] Rash  . Shellfish Allergy Rash    Patient Measurements: Height: 5\' 7"  (170.2 cm) Weight: 126 lb 15.8 oz (57.6 kg) IBW/kg (Calculated) : 61.6 Adjusted Body Weight: 60 kg   Vital Signs: Temp: 95.9 F (35.5 C) (03/28 2100) Temp Source: Rectal (03/28 2000) BP: 76/52 (03/28 2100) Pulse Rate: 60 (03/28 2100) Intake/Output from previous day: No intake/output data recorded. Intake/Output from this shift: Total I/O In: 1318.3 [I.V.:1318.3] Out: -   Labs:  Recent Labs  08/31/2016 1123  WBC 8.7  HGB 7.7*  PLT 151  CREATININE 4.57*   Estimated Creatinine Clearance: 8.5 mL/min (A) (by C-G formula based on SCr of 4.57 mg/dL (H)). No results for input(s): VANCOTROUGH, VANCOPEAK, VANCORANDOM, GENTTROUGH, GENTPEAK, GENTRANDOM, TOBRATROUGH, TOBRAPEAK, TOBRARND, AMIKACINPEAK, AMIKACINTROU, AMIKACIN in the last 72 hours.   Microbiology: Recent Results (from the past 720 hour(s))  Culture, sputum-assessment     Status: None   Collection Time: 08/27/16 12:45 PM  Result Value Ref Range Status   Specimen Description SPUTUM  Final   Special Requests NONE  Final   Sputum evaluation   Final    Sputum specimen not acceptable for testing.  Please recollect.     Report Status 08/27/2016 FINAL  Final    Medical History: Past Medical History:  Diagnosis Date  . Anemia   . Arrhythmia   . Cancer ([place])    breast ca - left, kidney  . Dialysis AV fistula infection ([place])    Left arm Mon, Wed, Fri dialysis  . Hypertension   . MI, old   . Renal insufficiency    dialysis  . Stroke [facility])    2009    Medications:  Prescriptions Prior to Admission  Medication Sig Dispense Refill Last Dose  . amiodarone (PACERONE) 200 MG tablet Take 200 mg by mouth daily.   09/11/2016 at Unknown time  . aspirin EC  81 MG tablet Take 81 mg by mouth daily.   08/23/2016 at 0900  . carvedilol (COREG) 6.25 MG tablet Take 6.25 mg by mouth 2 (two) times daily with a meal.    08/22/2016 at 0900  . clopidogrel (PLAVIX) 75 MG tablet Take 75 mg by mouth daily.   09/17/2016 at 0900  . esomeprazole (NEXIUM) 20 MG capsule Take 20 mg by mouth daily at 12 noon.   09/13/2016 at 1200  . ferrous sulfate 325 (65 FE) MG tablet Take 1 tablet (325 mg total) by mouth daily with breakfast. 30 tablet 3 08/27/2016 at 0900  . furosemide (LASIX) 80 MG tablet TAKE ONE TABLET BY MOUTH EVERY DAY 90 tablet 3 09/05/2016 at 0900  . gabapentin (NEURONTIN) 100 MG capsule Take 1 capsule (100 mg total) by mouth 3 (three) times daily. 90 capsule 0 09/11/2016 at 0900  . hydrALAZINE (APRESOLINE) 100 MG tablet Take 100 mg by mouth 2 (two) times daily.   09/17/2016 at 0900  . isosorbide mononitrate (IMDUR) 30 MG 24 hr tablet Take 30 mg by mouth daily.    09/11/2016 at 0900  . levothyroxine (SYNTHROID, LEVOTHROID) 100 MCG tablet Take 100 mcg by mouth daily before breakfast.   08/30/2016 at 0900  . lovastatin (MEVACOR) 40 MG tablet TAKE ONE TABLET BY MOUTH AT BEDTIME 90 tablet 3 09/13/2016 at 2000  . mirtazapine (REMERON) 15 MG tablet Take 15 mg by mouth at  bedtime.   09/13/2016 at 2000  . multivitamin (RENA-VIT) TABS tablet Take 1 tablet by mouth daily. 0.8mg    08/20/2016 at 0900  . senna (SENOKOT) 8.6 MG TABS tablet Take 1 tablet (8.6 mg total) by mouth 2 (two) times daily. 120 each 0 08/25/2016 at 0900  . SENSIPAR 30 MG tablet TAKE ONE TABLET BY MOUTH AT DINNER. (Patient taking differently: TAKE ONE TABLET BY MOUTH ON DIALYSIS DAYS.) 30 each 1 Past Week at Unknown time  . sevelamer carbonate (RENVELA) 800 MG tablet Take 800 mg by mouth 3 (three) times daily with meals.   08/26/2016 at 0900  . acetaminophen (TYLENOL) 325 MG tablet Take 2 tablets (650 mg total) by mouth every 6 (six) hours as needed for mild pain (or Fever >/= 101). 60 tablet 6 prn at prn  . Amino Acid  Infusion (PROSOL) 20 % SOLN    Taking  . amLODipine (NORVASC) 10 MG tablet Take 10 mg by mouth daily.   Not Taking at Unknown time  . anastrozole (ARIMIDEX) 1 MG tablet Take 1 mg by mouth daily.   Not Taking at Unknown time  . Azilsartan Medoxomil (EDARBI) 80 MG TABS Take 80 mg by mouth daily.   Not Taking at Unknown time  . bisacodyl (DULCOLAX) 10 MG suppository Place 1 suppository (10 mg total) rectally daily as needed for moderate constipation. 12 suppository 0 prn at prn  . cloNIDine (CATAPRES) 0.1 MG tablet Take 0.1 mg by mouth 3 (three) times daily. As directed   Not Taking at Unknown time  . diphenhydrAMINE (BENADRYL) 25 MG tablet Take 25 mg by mouth every 6 (six) hours as needed.   prn at prn  . HYDROcodone-acetaminophen (NORCO/VICODIN) 5-325 MG tablet Take 1 tablet by mouth every 4 (four) hours. 30 tablet 0 prn at prn   Assessment: Pharmacy consulted to dose vancomycin and aztreonam in this 83 year old female with sepsis.  Pt is on HD every MWF but missed session on 3/28.   Goal of Therapy:  Vancomycin trough level 15-20 mcg/ml  Plan:  Expected duration 7 days with resolution of temperature and/or normalization of WBC   Vancomycin 1 gm IV X 1 given on 3/28 @ 15:00. Vancomycin 500 mg IV Q MWF HD ordered to start on 3/30.  Will draw 1st trough before 3rd HD session on 4/4.   Aztreonam 2 gm IV X 1 on 3/28 @ 14:18. Aztreonam 500 mg IV Q12H ordered to start on 3/29 @ 0200.   Alante Tolan D 09/08/2016,9:43 PM

## 2016-09-14 NOTE — ED Notes (Signed)
Pt provided new warm blankets. Pt sitting up in bed, knees elevated. Pillow under knees. Ice pack previously applied to R knee. No distress noted at this time. Pt opens eyes when Rn enters room.

## 2016-09-15 ENCOUNTER — Ambulatory Visit: Payer: Medicare Other | Admitting: Surgery

## 2016-09-15 LAB — BLOOD CULTURE ID PANEL (REFLEXED)
ACINETOBACTER BAUMANNII: NOT DETECTED
CANDIDA ALBICANS: NOT DETECTED
CANDIDA GLABRATA: NOT DETECTED
CANDIDA KRUSEI: NOT DETECTED
Candida parapsilosis: NOT DETECTED
Candida tropicalis: NOT DETECTED
ENTEROBACTER CLOACAE COMPLEX: NOT DETECTED
ENTEROCOCCUS SPECIES: NOT DETECTED
ESCHERICHIA COLI: NOT DETECTED
Enterobacteriaceae species: NOT DETECTED
Haemophilus influenzae: NOT DETECTED
KLEBSIELLA OXYTOCA: NOT DETECTED
Klebsiella pneumoniae: NOT DETECTED
LISTERIA MONOCYTOGENES: NOT DETECTED
Methicillin resistance: NOT DETECTED
Neisseria meningitidis: NOT DETECTED
PSEUDOMONAS AERUGINOSA: NOT DETECTED
Proteus species: NOT DETECTED
STREPTOCOCCUS AGALACTIAE: NOT DETECTED
STREPTOCOCCUS PNEUMONIAE: NOT DETECTED
STREPTOCOCCUS PYOGENES: NOT DETECTED
Serratia marcescens: NOT DETECTED
Staphylococcus aureus (BCID): NOT DETECTED
Staphylococcus species: DETECTED — AB
Streptococcus species: NOT DETECTED

## 2016-09-15 LAB — MRSA PCR SCREENING: MRSA by PCR: POSITIVE — AB

## 2016-09-17 LAB — CULTURE, BLOOD (ROUTINE X 2)
SPECIAL REQUESTS: ADEQUATE
SPECIAL REQUESTS: ADEQUATE

## 2016-09-18 NOTE — Progress Notes (Signed)
Chaplain received a page to respond to room IC9. Pt passed away. Family was in great distress and were crying alot. Provided comfort to family, prayer, grief support and emotional support.    Oct 01, 2016 0057  Clinical Encounter Type  Visited With Patient;Patient and family together  Visit Type Initial;Spiritual support  Referral From Nurse  Consult/Referral To Chaplain  Spiritual Encounters  Spiritual Needs Prayer;Emotional;Grief support

## 2016-09-18 NOTE — Progress Notes (Signed)
Pt passed away at 00:01 on Oct 15, 2016. Nurse and Cherre Robins, NP at beside at time of death. Family arrived shortly after. Chaplin paged. Dr. Ara Kussmaul paged and notified. [facility] notified.

## 2016-09-18 NOTE — Discharge Summary (Deleted)
  DEATH SUMMARY   Name: Patricia Woodard MRN:   [id number] DOB:   1931-09-08           ADMISSION DATE:  09/07/2016 CONSULTATION DATE: 09/06/2016  REFERRING MD :  Dr. Darvin Neighbours   CHIEF COMPLAINT:  Hypotension   [name] was an 83 year old female with  history of Breast cancer with metastases, incisional disease, dementia, hypertension, chronic anemia, chronic sacral decubitus ulcer and  diastolic CHF and ESRD. Patient usually gets dialyzed on M-W-F. Patient was not dialyzed  due to severe hypotension.  Her lactic acid was elevated at 6.5.  Patient was noted to be meeting sepsis criteria likely source being sacral ulcer.Patient was started on broad spectrum antibiotics. Family did not want any aggressive treatment measures on her which will prolong her suffering such as central line and mechanical ventilation.  So there fore she was started on pressors peripherally.   Family made her DNR/DNI . Patients condition continued to decline.  She went into cardio respiratory arrest.  Patient was pronounced dead at 00:01 on 24-Sep-2016.  Family members were informed.  Chaplain was called at the bedside.  Cause of Death  1. Cardio Pulmonary arrest. 2. Severe Septic shock secondary to decubitus ulcer. 3. Lactic Acidosis 4. Acute Encephalopathy over dementia due to sepsis 5 ESRD on Hemodialysis  Time of Death -00:01 Date of Death- 2016-09-24     Bincy Varughese,AG-ACNP Pulmonary & Critical Care

## 2016-09-18 DEATH — deceased

## 2016-09-20 ENCOUNTER — Telehealth: Payer: Self-pay

## 2016-09-20 NOTE — Telephone Encounter (Signed)
Death cert placed on DS' desk to fill out.

## 2016-09-20 NOTE — Telephone Encounter (Signed)
Received death certificate from [employer].  Placed in lbpu box please call when ready for pick up.

## 2016-09-21 NOTE — Telephone Encounter (Signed)
Death cert placed up front and funeral informed ready for pick up.

## 2016-10-18 NOTE — Discharge Summary (Signed)
[facility] NOTE  PATIENT NAME: Patricia Woodard    MR#:  [id number]  DATE OF BIRTH:  1931-10-14  DATE OF ADMISSION:  08/26/2016  PRIMARY CARE PHYSICIAN: Madelyn Brunner, MD   Pronounced dead  on 10/12/2016  at 12.01 AM         CAUSE OF DEATH - * Septic shock * Large sacral decubitus ulcer with cellulitis  ADMISSION DIAGNOSIS:  Hypotension, unspecified hypotension type [I95.9]  HISTORY OF PRESENT ILLNESS ON ADMISSION   HISTORY OF PRESENT ILLNESS:  Patricia Woodard  is a 83 y.o. female with a known history of Breast cancer with metastases, incisional disease, dementia, hypertension, chronic anemia, chronic sacral decubitus ulcer, diastolic CHF presents to the emergency room from dialysis center due to hypotension. Patient did not get her dialysis today. She is on Monday, Wednesday, Friday schedule. Here patient has been found to have systolic blood pressure in the low 60s which has not responded well to fluids of 1 L. Chest x-ray is clear. Lactic acid elevated at 6.5. Likely source of septic shock being sacral decubitus ulcer. Family did not want a central line placed for pressors. They want to try IV antibiotics for 24 hours and if no improvement want further aggressive measures. They understand patient is acutely ill. During last admission patient had expressed her wishes to continue dialysis as long as she can. This limited her choices of hospice.   HOSPITAL COURSE:  * Septic shock * Sacral decubitus ulcer with cellulitis * Lactic acidosis * Acute encephalopathy over dementia * End-stage renal disease on hemodialysis * Acute on chronic anemia * Elevated troponin due to demand ischemia. Not MI. * Hypertension  Patient was admitted from the emergency room due to septic shock with likely source of large sacral decubitus ulcer with cellulitis and purulent discharge. After extensive discussion with the family they wanted IV  antibiotics. Did not want a central line. She was started on dopamine through a peripheral line and transferred to ICU. Here patient's care was taken over by the ICU team and she was on pressors. Patient slowly deteriorated. She was DO NOT RESUSCITATE. In the ICU patient was found to be unresponsive and pronounced dead at 12 :01 AM.  Hillary Bow R M.D on 09/29/2016 at 1:53 PM  [facility]  Total clinical and documentation time for today Under 30 minutes

## 2017-01-31 IMAGING — MG MM DIGITAL DIAGNOSTIC BILAT W/ TOMO W/ CAD
8 of 16 series · 8 of 16 positions shown · non-contrast
Comparison: Priors

CLINICAL DATA: Patient presents for evaluation of palpable mass
left breast.

EXAM:
DIGITAL DIAGNOSTIC BILATERAL MAMMOGRAM WITH 3D TOMOSYNTHESIS WITH
CAD
ULTRASOUND LEFT BREAST

[R XCCL]
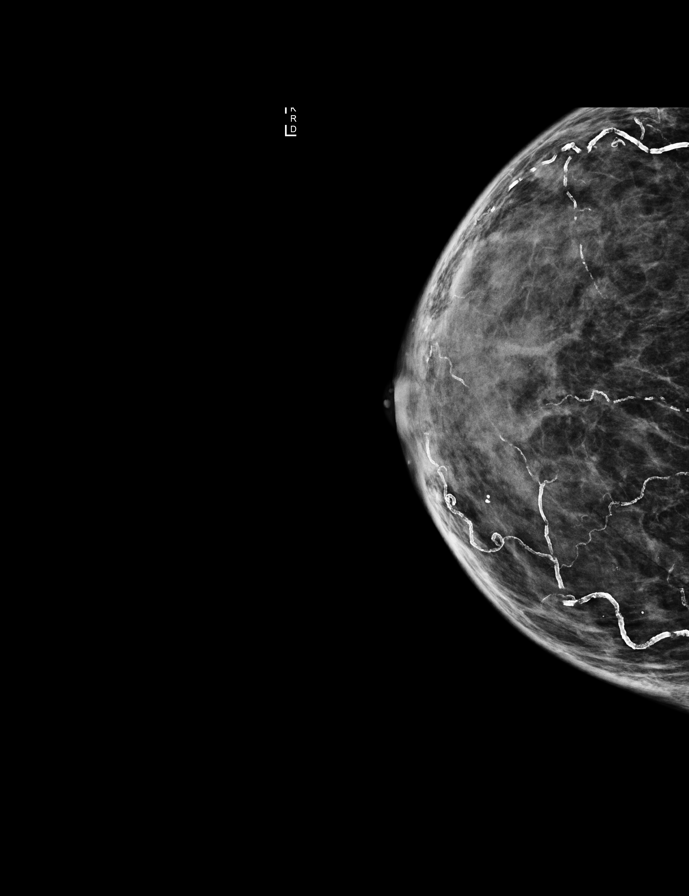

[L MLO synth-2D]
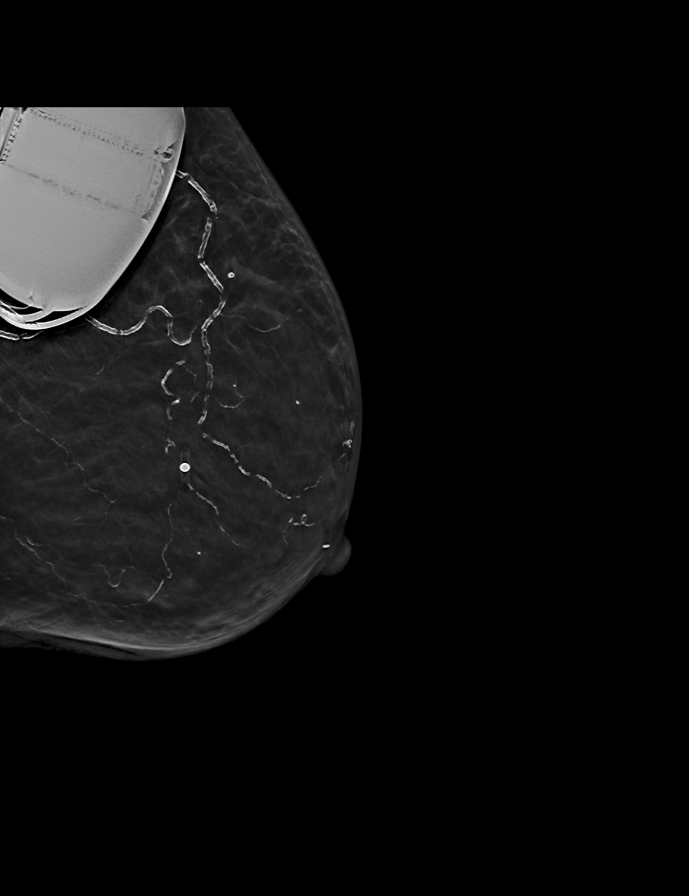

[L TAN synth-2D]
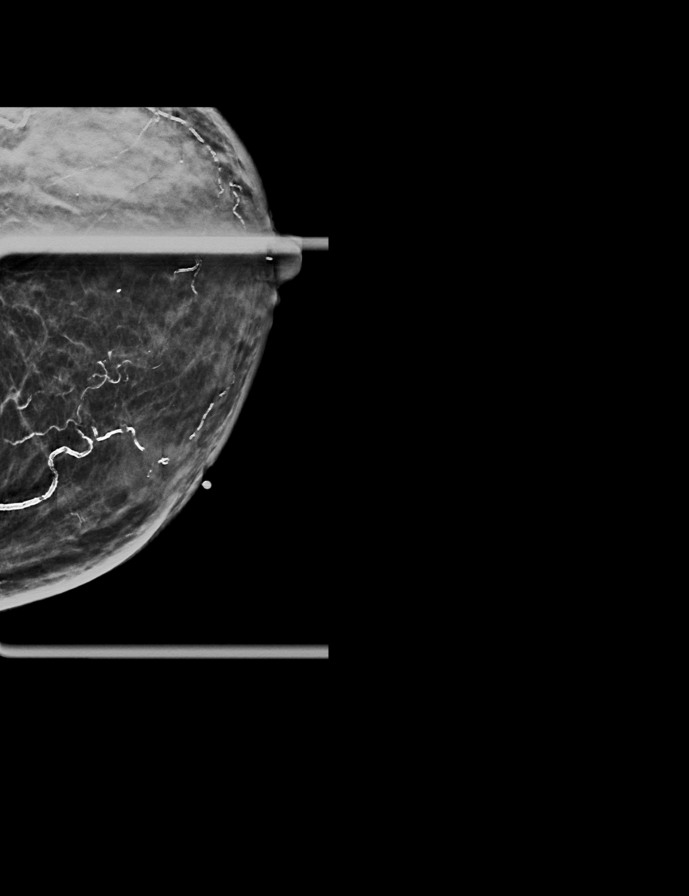

[L TAN]
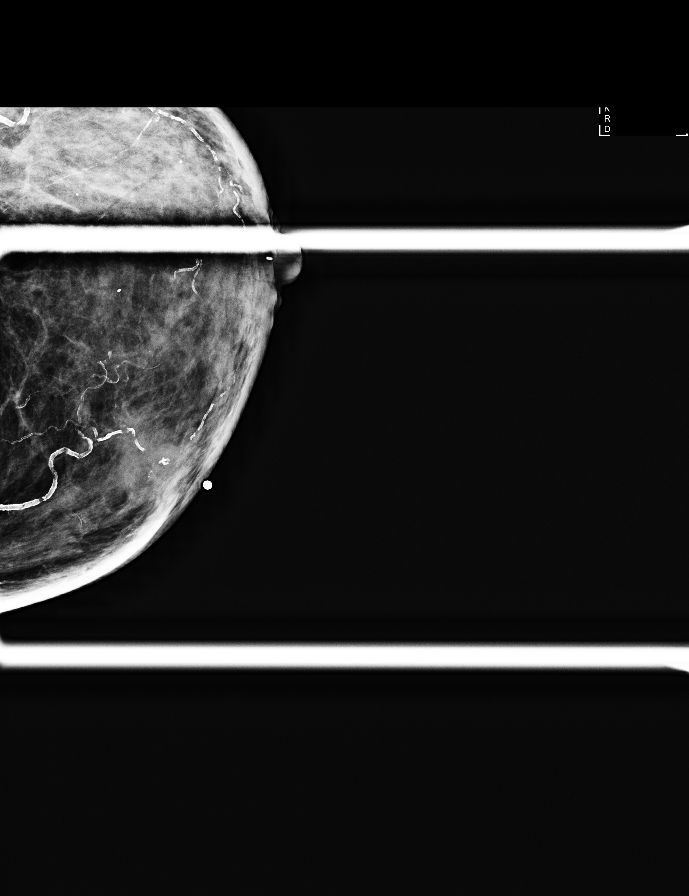

[R CC]
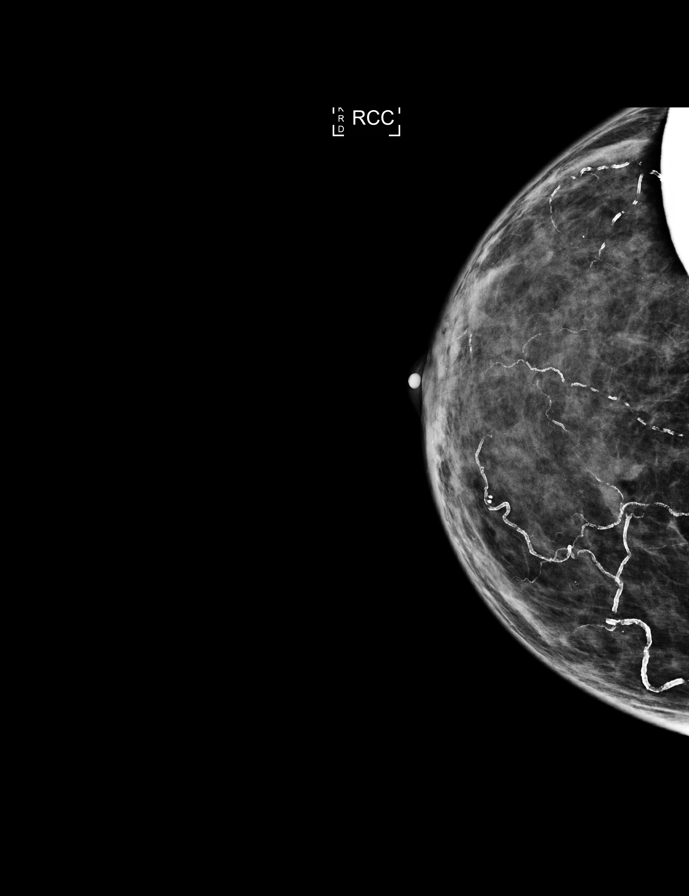

[R MLO]
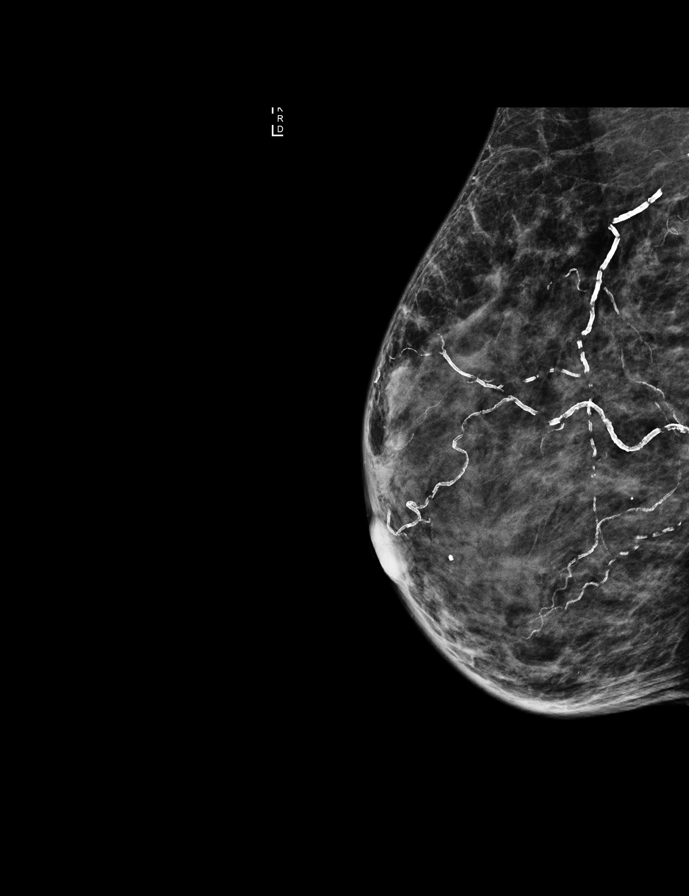

[L CC synth-2D]
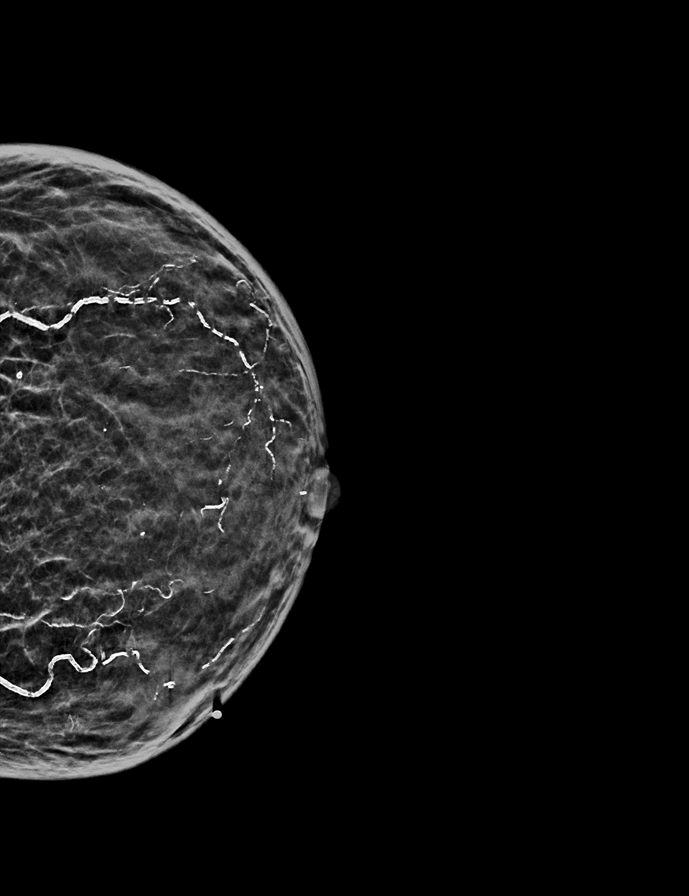

[R MLO synth-2D]
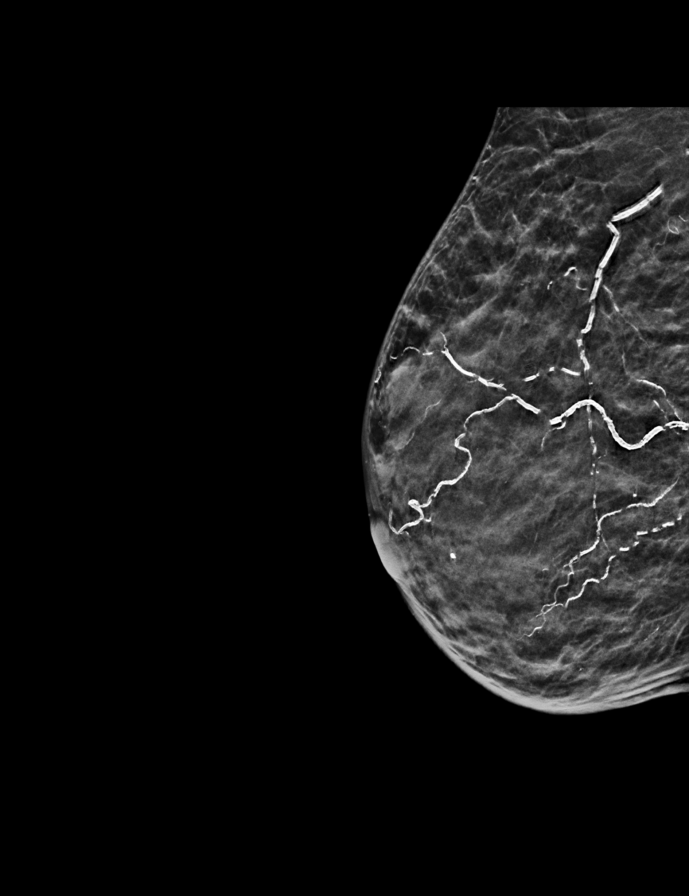

[8 of 16 positions shown; findings below may reference images not displayed]

ACR Breast Density Category d: The breast tissue is extremely dense,
which lowers the sensitivity of mammography.
FINDINGS: Within the left breast 930 o'clock middle depth underlying the
palpable abnormality is a 2.5 cm irregular mass with central
calcifications. No additional concerning masses, calcifications or
architectural distortion identified within either breast.

Mammographic images were processed with CAD.

On physical exam, I palpate a firm mass within left breast 930
o'clock.

Targeted ultrasound is performed, showing a 2.4 x 2.4 x 1.4 cm
irregular hypoechoic mass within the left breast 930 o'clock 3 cm
from the nipple corresponding with palpable abnormality. There are
central increased areas of echogenicity corresponding with
calcifications demonstrated on mammography. No definite left
axillary lymphadenopathy although scanning of the left axilla was
somewhat limited due to difficulty with patient positioning.
IMPRESSION: Suspicious palpable left breast mass.

RECOMMENDATION:
Ultrasound-guided core needle biopsy left breast mass.

Biopsy will be scheduled at patient's convenience.

I have discussed the findings and recommendations with the patient.
Results were also provided in writing at the conclusion of the
visit. If applicable, a reminder letter will be sent to the patient
regarding the next appointment.

BI-RADS CATEGORY  5: Highly suggestive of malignancy.
